# Patient Record
Sex: Female | Born: 1960 | Race: Black or African American | Hispanic: No | Marital: Single | Smoking: Current every day smoker
Health system: Southern US, Community
[De-identification: ages and names within clinical notes are randomized; demographics above are authoritative.]

## PROBLEM LIST (undated history)

## (undated) DIAGNOSIS — N200 Calculus of kidney: Secondary | ICD-10-CM

## (undated) DIAGNOSIS — I10 Essential (primary) hypertension: Secondary | ICD-10-CM

## (undated) DIAGNOSIS — R569 Unspecified convulsions: Secondary | ICD-10-CM

## (undated) HISTORY — PX: KIDNEY STONE SURGERY: SHX686

## (undated) HISTORY — PX: LITHOTRIPSY: SUR834

---

## 1999-12-09 ENCOUNTER — Other Ambulatory Visit: Admission: RE | Admit: 1999-12-09 | Discharge: 1999-12-09 | Payer: Self-pay | Admitting: Obstetrics and Gynecology

## 2000-01-15 ENCOUNTER — Encounter (INDEPENDENT_AMBULATORY_CARE_PROVIDER_SITE_OTHER): Payer: Self-pay

## 2000-01-15 ENCOUNTER — Other Ambulatory Visit: Admission: RE | Admit: 2000-01-15 | Discharge: 2000-01-15 | Payer: Self-pay | Admitting: Obstetrics and Gynecology

## 2002-04-20 ENCOUNTER — Encounter: Payer: Self-pay | Admitting: *Deleted

## 2002-04-20 ENCOUNTER — Encounter: Admission: RE | Admit: 2002-04-20 | Discharge: 2002-04-20 | Payer: Self-pay | Admitting: *Deleted

## 2002-05-25 ENCOUNTER — Emergency Department (HOSPITAL_COMMUNITY): Admission: EM | Admit: 2002-05-25 | Discharge: 2002-05-25 | Payer: Self-pay | Admitting: Emergency Medicine

## 2003-06-19 ENCOUNTER — Other Ambulatory Visit: Admission: RE | Admit: 2003-06-19 | Discharge: 2003-06-19 | Payer: Self-pay | Admitting: *Deleted

## 2006-12-28 ENCOUNTER — Other Ambulatory Visit: Admission: RE | Admit: 2006-12-28 | Discharge: 2006-12-28 | Payer: Self-pay | Admitting: *Deleted

## 2009-10-27 ENCOUNTER — Encounter: Admission: RE | Admit: 2009-10-27 | Discharge: 2009-10-27 | Payer: Self-pay | Admitting: Gastroenterology

## 2011-03-06 ENCOUNTER — Emergency Department (HOSPITAL_COMMUNITY): Payer: 59

## 2011-03-06 ENCOUNTER — Emergency Department (HOSPITAL_COMMUNITY)
Admission: EM | Admit: 2011-03-06 | Discharge: 2011-03-06 | Disposition: A | Discharge status: Home / Self Care | Payer: 59 | Attending: Emergency Medicine | Admitting: Emergency Medicine

## 2011-03-06 DIAGNOSIS — N200 Calculus of kidney: Secondary | ICD-10-CM | POA: Insufficient documentation

## 2011-03-06 DIAGNOSIS — R112 Nausea with vomiting, unspecified: Secondary | ICD-10-CM | POA: Insufficient documentation

## 2011-03-06 LAB — URINALYSIS, ROUTINE W REFLEX MICROSCOPIC
Bilirubin Urine: NEGATIVE
Ketones, ur: NEGATIVE mg/dL
Nitrite: NEGATIVE
pH: 5.5 (ref 5.0–8.0)

## 2011-03-06 LAB — POCT I-STAT, CHEM 8
BUN: 10 mg/dL (ref 6–23)
Calcium, Ion: 1.16 mmol/L (ref 1.12–1.32)
Chloride: 108 mEq/L (ref 96–112)
Creatinine, Ser: 0.7 mg/dL (ref 0.4–1.2)
Glucose, Bld: 134 mg/dL — ABNORMAL HIGH (ref 70–99)
HCT: 35 % — ABNORMAL LOW (ref 36.0–46.0)
Hemoglobin: 11.9 g/dL — ABNORMAL LOW (ref 12.0–15.0)
Potassium: 3.8 mEq/L (ref 3.5–5.1)
TCO2: 21 mmol/L (ref 0–100)

## 2011-03-06 LAB — URINE MICROSCOPIC-ADD ON

## 2011-03-06 LAB — PREGNANCY, URINE: Preg Test, Ur: NEGATIVE

## 2011-03-09 ENCOUNTER — Ambulatory Visit (HOSPITAL_BASED_OUTPATIENT_CLINIC_OR_DEPARTMENT_OTHER)
Admission: RE | Admit: 2011-03-09 | Discharge: 2011-03-09 | Disposition: A | Discharge status: Home / Self Care | Payer: 59 | Source: Ambulatory Visit | Attending: Urology | Admitting: Urology

## 2011-03-09 DIAGNOSIS — N201 Calculus of ureter: Secondary | ICD-10-CM | POA: Insufficient documentation

## 2011-03-09 DIAGNOSIS — F17200 Nicotine dependence, unspecified, uncomplicated: Secondary | ICD-10-CM | POA: Insufficient documentation

## 2011-03-09 LAB — POCT PREGNANCY, URINE: Preg Test, Ur: NEGATIVE

## 2011-03-09 LAB — POCT HEMOGLOBIN-HEMACUE: Hemoglobin: 10 g/dL — ABNORMAL LOW (ref 12.0–15.0)

## 2011-03-16 NOTE — Op Note (Addendum)
  Deanna Goodwin, Deanna Goodwin              ACCOUNT NO.:  [id number]  MEDICAL RECORD NO.:  [id number]  LOCATION:  [facility]                         FACILITY:  [facility]  PHYSICIAN:  Martina Sinner, MD DATE OF BIRTH:  1961/08/18  DATE OF PROCEDURE: DATE OF DISCHARGE:  03/06/2011                              OPERATIVE REPORT   PREOPERATIVE DIAGNOSIS:  Left ureteral stone.  POSTOPERATIVE DIAGNOSIS:  Left ureteral stone.  SURGERY:  Cystoscopy, left retrograde ureterogram, insertion of left ureteral stent.  INDICATIONS FOR PROCEDURE:  Ms. [name] has bilateral renal calculi.  She has a large stone on the right and poorly calcified 11 x 6 mm stone at the left ureteropelvic junction.  She is requiring Percocet. She consented to cystoscopy.  She consented to the above procedure.  She may need either lithotripsy but more likely ureteroscopy on the left eventually.  She probably will need a percutaneous procedure on the right based on stone burden and size.  DESCRIPTION OF PROCEDURE:  Preoperative antibiotics were given.  A 21- [place] scope was utilized.  Bladder mucosa and trigone were normal. There was no stitch, foreign body or carcinoma.  Left ureteral orifice was easy to identify.  Fluoroscopically and cystoscopically, I passed a flexible wire to the mid ureter.  Over this, I passed a 6-French open-ended ureteral catheter.  I did a gentle retrograde ureterogram but the dye would not enter the pelvis other than a few wisps.  I therefore advanced the wire up to what I thought was the renal collecting system.  I then reinserted the open-end catheter to that level and removed the wire and injected gently 2 mL of contrast and I could partially dilated calices as a target to place my stent.  She had a very impressive volcano affect with hydronephrotic drip with dark colored urine after I passed the open-ended catheter, passed the ureteropelvic junction stone.  With the wire replaced  currently nicely in the upper and mid calices, I passed a 24 cm x 6 French double-J stent with no strain under fluoroscopic guidance and cystoscopic guidance into the upper pole calyx and currently nicely in the bladder.  She is only 5 feet 3.  Bladder was emptied and the patient was taken to the recovery  room.  Retrograde Ureterogram:  I did a retrograde ureterogram using the above technique.  I did 2 injections as noted above.  A total of approximately 12 mL of contrast was instilled but the first 10 mL demonstrated a fairly normal ureter with a possible filling defect at the ureteropelvic junction and the rest of the dye exited the ureteral orifice.  Fluoroscopic films were taken but I did not put her copies on her chart.          ______________________________ Martina Sinner, MD     SAM/MEDQ  D:  03/09/2011  T:  03/09/2011  Job:  [id number]  Electronically Signed by Alfredo Martinez MD on 03/30/2011 12:28:56 PM

## 2011-03-19 ENCOUNTER — Ambulatory Visit (HOSPITAL_COMMUNITY): Payer: 59

## 2011-03-19 ENCOUNTER — Ambulatory Visit (HOSPITAL_BASED_OUTPATIENT_CLINIC_OR_DEPARTMENT_OTHER)
Admission: RE | Admit: 2011-03-19 | Discharge: 2011-03-19 | Disposition: A | Discharge status: Home / Self Care | Payer: 59 | Source: Ambulatory Visit | Attending: Urology | Admitting: Urology

## 2011-03-19 DIAGNOSIS — F17200 Nicotine dependence, unspecified, uncomplicated: Secondary | ICD-10-CM | POA: Insufficient documentation

## 2011-03-19 DIAGNOSIS — N200 Calculus of kidney: Secondary | ICD-10-CM | POA: Insufficient documentation

## 2011-03-19 DIAGNOSIS — Z01818 Encounter for other preprocedural examination: Secondary | ICD-10-CM | POA: Insufficient documentation

## 2011-03-19 DIAGNOSIS — Z01812 Encounter for preprocedural laboratory examination: Secondary | ICD-10-CM | POA: Insufficient documentation

## 2011-03-19 LAB — POCT HEMOGLOBIN-HEMACUE: Hemoglobin: 10.3 g/dL — ABNORMAL LOW (ref 12.0–15.0)

## 2011-03-19 LAB — POCT PREGNANCY, URINE: Preg Test, Ur: NEGATIVE

## 2012-10-04 ENCOUNTER — Other Ambulatory Visit: Payer: Self-pay | Admitting: Gynecology

## 2012-10-04 DIAGNOSIS — R928 Other abnormal and inconclusive findings on diagnostic imaging of breast: Secondary | ICD-10-CM

## 2012-10-17 ENCOUNTER — Ambulatory Visit
Admission: RE | Admit: 2012-10-17 | Discharge: 2012-10-17 | Disposition: A | Discharge status: Home / Self Care | Payer: 59 | Source: Ambulatory Visit | Attending: Gynecology | Admitting: Gynecology

## 2012-10-17 DIAGNOSIS — R928 Other abnormal and inconclusive findings on diagnostic imaging of breast: Secondary | ICD-10-CM

## 2014-06-26 ENCOUNTER — Encounter (HOSPITAL_COMMUNITY): Payer: PRIVATE HEALTH INSURANCE | Admitting: Anesthesiology

## 2014-06-26 ENCOUNTER — Encounter (HOSPITAL_COMMUNITY)
Admission: EM | Disposition: A | Discharge status: Home / Self Care | Payer: Self-pay | Source: Home / Self Care | Attending: Urology

## 2014-06-26 ENCOUNTER — Emergency Department (HOSPITAL_COMMUNITY): Payer: PRIVATE HEALTH INSURANCE

## 2014-06-26 ENCOUNTER — Inpatient Hospital Stay (HOSPITAL_COMMUNITY): Payer: PRIVATE HEALTH INSURANCE

## 2014-06-26 ENCOUNTER — Inpatient Hospital Stay (HOSPITAL_COMMUNITY)
Admission: EM | Admit: 2014-06-26 | Discharge: 2014-06-29 | DRG: 871 | Disposition: A | Discharge status: Home / Self Care | Payer: PRIVATE HEALTH INSURANCE | Attending: Urology | Admitting: Urology

## 2014-06-26 ENCOUNTER — Encounter (HOSPITAL_COMMUNITY): Payer: Self-pay | Admitting: Emergency Medicine

## 2014-06-26 ENCOUNTER — Emergency Department (HOSPITAL_COMMUNITY): Payer: PRIVATE HEALTH INSURANCE | Admitting: Anesthesiology

## 2014-06-26 DIAGNOSIS — D649 Anemia, unspecified: Secondary | ICD-10-CM

## 2014-06-26 DIAGNOSIS — N12 Tubulo-interstitial nephritis, not specified as acute or chronic: Secondary | ICD-10-CM | POA: Diagnosis present

## 2014-06-26 DIAGNOSIS — N132 Hydronephrosis with renal and ureteral calculous obstruction: Secondary | ICD-10-CM | POA: Diagnosis present

## 2014-06-26 DIAGNOSIS — I10 Essential (primary) hypertension: Secondary | ICD-10-CM | POA: Diagnosis present

## 2014-06-26 DIAGNOSIS — N202 Calculus of kidney with calculus of ureter: Secondary | ICD-10-CM | POA: Diagnosis present

## 2014-06-26 DIAGNOSIS — N179 Acute kidney failure, unspecified: Secondary | ICD-10-CM | POA: Diagnosis present

## 2014-06-26 DIAGNOSIS — D696 Thrombocytopenia, unspecified: Secondary | ICD-10-CM | POA: Diagnosis present

## 2014-06-26 DIAGNOSIS — N289 Disorder of kidney and ureter, unspecified: Secondary | ICD-10-CM

## 2014-06-26 DIAGNOSIS — N390 Urinary tract infection, site not specified: Secondary | ICD-10-CM

## 2014-06-26 DIAGNOSIS — A419 Sepsis, unspecified organism: Secondary | ICD-10-CM | POA: Diagnosis present

## 2014-06-26 DIAGNOSIS — J9811 Atelectasis: Secondary | ICD-10-CM | POA: Diagnosis present

## 2014-06-26 DIAGNOSIS — Z720 Tobacco use: Secondary | ICD-10-CM

## 2014-06-26 DIAGNOSIS — Z87442 Personal history of urinary calculi: Secondary | ICD-10-CM | POA: Diagnosis not present

## 2014-06-26 DIAGNOSIS — N138 Other obstructive and reflux uropathy: Secondary | ICD-10-CM | POA: Diagnosis present

## 2014-06-26 DIAGNOSIS — Z23 Encounter for immunization: Secondary | ICD-10-CM | POA: Diagnosis not present

## 2014-06-26 DIAGNOSIS — R6521 Severe sepsis with septic shock: Secondary | ICD-10-CM | POA: Diagnosis present

## 2014-06-26 DIAGNOSIS — G9341 Metabolic encephalopathy: Secondary | ICD-10-CM | POA: Diagnosis not present

## 2014-06-26 DIAGNOSIS — K30 Functional dyspepsia: Secondary | ICD-10-CM | POA: Diagnosis present

## 2014-06-26 DIAGNOSIS — F17210 Nicotine dependence, cigarettes, uncomplicated: Secondary | ICD-10-CM | POA: Diagnosis present

## 2014-06-26 DIAGNOSIS — N2889 Other specified disorders of kidney and ureter: Secondary | ICD-10-CM

## 2014-06-26 DIAGNOSIS — R109 Unspecified abdominal pain: Secondary | ICD-10-CM | POA: Diagnosis present

## 2014-06-26 HISTORY — PX: CYSTOSCOPY WITH RETROGRADE PYELOGRAM, URETEROSCOPY AND STENT PLACEMENT: SHX5789

## 2014-06-26 HISTORY — DX: Calculus of kidney: N20.0

## 2014-06-26 LAB — COMPREHENSIVE METABOLIC PANEL
ALK PHOS: 139 U/L — AB (ref 39–117)
ALT: 21 U/L (ref 0–35)
ALT: 18 U/L (ref 0–35)
AST: 22 U/L (ref 0–37)
AST: 27 U/L (ref 0–37)
Albumin: 2.8 g/dL — ABNORMAL LOW (ref 3.5–5.2)
Albumin: 1.9 g/dL — ABNORMAL LOW (ref 3.5–5.2)
Alkaline Phosphatase: 200 U/L — ABNORMAL HIGH (ref 39–117)
Anion gap: 28 — ABNORMAL HIGH (ref 5–15)
Anion gap: 17 — ABNORMAL HIGH (ref 5–15)
BUN: 19 mg/dL (ref 6–23)
BUN: 19 mg/dL (ref 6–23)
CALCIUM: 9.6 mg/dL (ref 8.4–10.5)
CALCIUM: 7.2 mg/dL — AB (ref 8.4–10.5)
CHLORIDE: 103 meq/L (ref 96–112)
CO2: 16 mEq/L — ABNORMAL LOW (ref 19–32)
CO2: 16 mEq/L — ABNORMAL LOW (ref 19–32)
CREATININE: 2.11 mg/dL — AB (ref 0.50–1.10)
Chloride: 94 mEq/L — ABNORMAL LOW (ref 96–112)
Creatinine, Ser: 2.06 mg/dL — ABNORMAL HIGH (ref 0.50–1.10)
GFR calc Af Amer: 31 mL/min — ABNORMAL LOW (ref >90)
GFR calc non Af Amer: 26 mL/min — ABNORMAL LOW (ref >90)
GFR, EST AFRICAN AMERICAN: 30 mL/min — AB (ref >90)
GFR, EST NON AFRICAN AMERICAN: 27 mL/min — AB (ref >90)
GLUCOSE: 119 mg/dL — AB (ref 70–99)
Glucose, Bld: 114 mg/dL — ABNORMAL HIGH (ref 70–99)
POTASSIUM: 3.2 meq/L — AB (ref 3.7–5.3)
Potassium: 4.4 mEq/L (ref 3.7–5.3)
SODIUM: 138 meq/L (ref 137–147)
Sodium: 136 mEq/L — ABNORMAL LOW (ref 137–147)
TOTAL PROTEIN: 7.8 g/dL (ref 6.0–8.3)
Total Bilirubin: 0.9 mg/dL (ref 0.3–1.2)
Total Bilirubin: 1.1 mg/dL (ref 0.3–1.2)
Total Protein: 5.5 g/dL — ABNORMAL LOW (ref 6.0–8.3)

## 2014-06-26 LAB — URINALYSIS, ROUTINE W REFLEX MICROSCOPIC
GLUCOSE, UA: NEGATIVE mg/dL
Ketones, ur: NEGATIVE mg/dL
Nitrite: NEGATIVE
PH: 5 (ref 5.0–8.0)
PROTEIN: 100 mg/dL — AB
SPECIFIC GRAVITY, URINE: 1.02 (ref 1.005–1.030)
UROBILINOGEN UA: 1 mg/dL (ref 0.0–1.0)

## 2014-06-26 LAB — CBC WITH DIFFERENTIAL/PLATELET
BASOS ABS: 0 10*3/uL (ref 0.0–0.1)
Basophils Relative: 0 % (ref 0–1)
EOS ABS: 0.1 10*3/uL (ref 0.0–0.7)
EOS PCT: 3 % (ref 0–5)
HCT: 31.3 % — ABNORMAL LOW (ref 36.0–46.0)
Hemoglobin: 9.8 g/dL — ABNORMAL LOW (ref 12.0–15.0)
Lymphocytes Relative: 37 % (ref 12–46)
Lymphs Abs: 1 10*3/uL (ref 0.7–4.0)
MCH: 24.7 pg — AB (ref 26.0–34.0)
MCHC: 31.3 g/dL (ref 30.0–36.0)
MCV: 78.8 fL (ref 78.0–100.0)
MONO ABS: 0 10*3/uL — AB (ref 0.1–1.0)
Monocytes Relative: 2 % — ABNORMAL LOW (ref 3–12)
Neutro Abs: 1.5 10*3/uL — ABNORMAL LOW (ref 1.7–7.7)
Neutrophils Relative %: 58 % (ref 43–77)
PLATELETS: 199 10*3/uL (ref 150–400)
RBC: 3.97 MIL/uL (ref 3.87–5.11)
RDW: 18.9 % — AB (ref 11.5–15.5)
WBC: 2.6 10*3/uL — ABNORMAL LOW (ref 4.0–10.5)

## 2014-06-26 LAB — LIPASE, BLOOD: LIPASE: 7 U/L — AB (ref 11–59)

## 2014-06-26 LAB — I-STAT CG4 LACTIC ACID, ED: Lactic Acid, Venous: 4.24 mmol/L — ABNORMAL HIGH (ref 0.5–2.2)

## 2014-06-26 LAB — TROPONIN I: Troponin I: <0.3 ng/mL (ref <0.30)

## 2014-06-26 LAB — URINE MICROSCOPIC-ADD ON

## 2014-06-26 LAB — CBC
HCT: 24.6 % — ABNORMAL LOW (ref 36.0–46.0)
Hemoglobin: 7.9 g/dL — ABNORMAL LOW (ref 12.0–15.0)
MCH: 24.5 pg — AB (ref 26.0–34.0)
MCHC: 32.1 g/dL (ref 30.0–36.0)
MCV: 76.2 fL — ABNORMAL LOW (ref 78.0–100.0)
Platelets: 98 10*3/uL — ABNORMAL LOW (ref 150–400)
RBC: 3.23 MIL/uL — ABNORMAL LOW (ref 3.87–5.11)
RDW: 19 % — ABNORMAL HIGH (ref 11.5–15.5)
WBC: 9.7 10*3/uL (ref 4.0–10.5)

## 2014-06-26 LAB — MRSA PCR SCREENING: MRSA BY PCR: NEGATIVE

## 2014-06-26 LAB — LACTIC ACID, PLASMA: Lactic Acid, Venous: 2.7 mmol/L — ABNORMAL HIGH (ref 0.5–2.2)

## 2014-06-26 SURGERY — CYSTOURETEROSCOPY, WITH RETROGRADE PYELOGRAM AND STENT INSERTION
Anesthesia: General | Site: Urethra | Laterality: Right

## 2014-06-26 MED ORDER — SODIUM CHLORIDE 0.9 % IV SOLN
INTRAVENOUS | Status: DC
  Administered 2014-06-26: 1000 mL via INTRAVENOUS
  Administered 2014-06-27 – 2014-06-28 (×2): via INTRAVENOUS

## 2014-06-26 MED ORDER — ENOXAPARIN SODIUM 40 MG/0.4ML ~~LOC~~ SOLN
40.0000 mg | SUBCUTANEOUS | Status: DC
  Administered 2014-06-27 – 2014-06-29 (×3): 40 mg via SUBCUTANEOUS
  Filled 2014-06-26 (×3): qty 0.4

## 2014-06-26 MED ORDER — LIDOCAINE HCL (CARDIAC) 20 MG/ML IV SOLN
INTRAVENOUS | Status: DC | PRN
  Administered 2014-06-26: 50 mg via INTRAVENOUS

## 2014-06-26 MED ORDER — SODIUM CHLORIDE 0.9 % IV BOLUS (SEPSIS)
1000.0000 mL | Freq: Once | INTRAVENOUS | Status: AC
  Administered 2014-06-26: 1000 mL via INTRAVENOUS

## 2014-06-26 MED ORDER — POTASSIUM CHLORIDE 10 MEQ/50ML IV SOLN: 10.0000 meq | INTRAVENOUS | Status: DC

## 2014-06-26 MED ORDER — HYDROMORPHONE HCL 1 MG/ML IJ SOLN
0.5000 mg | INTRAMUSCULAR | Status: DC | PRN
  Administered 2014-06-26: 1 mg via INTRAVENOUS
  Filled 2014-06-26: qty 1

## 2014-06-26 MED ORDER — BELLADONNA ALKALOIDS-OPIUM 16.2-60 MG RE SUPP
RECTAL | Status: AC
  Filled 2014-06-26: qty 1

## 2014-06-26 MED ORDER — LACTATED RINGERS IV SOLN
INTRAVENOUS | Status: DC | PRN
  Administered 2014-06-26: 16:00:00 via INTRAVENOUS

## 2014-06-26 MED ORDER — PROPOFOL 10 MG/ML IV BOLUS
INTRAVENOUS | Status: AC
  Filled 2014-06-26: qty 20

## 2014-06-26 MED ORDER — ONDANSETRON HCL 4 MG/2ML IJ SOLN
4.0000 mg | Freq: Once | INTRAMUSCULAR | Status: AC
  Administered 2014-06-26: 4 mg via INTRAVENOUS
  Filled 2014-06-26: qty 2

## 2014-06-26 MED ORDER — DEXTROSE 5 % IV SOLN
1.0000 g | INTRAVENOUS | Status: DC
  Administered 2014-06-27: 1 g via INTRAVENOUS
  Filled 2014-06-26: qty 10

## 2014-06-26 MED ORDER — SODIUM CHLORIDE 0.9 % IR SOLN
Status: DC | PRN
  Administered 2014-06-26: 3000 mL

## 2014-06-26 MED ORDER — INFLUENZA VAC SPLIT QUAD 0.5 ML IM SUSY
0.5000 mL | PREFILLED_SYRINGE | INTRAMUSCULAR | Status: AC
  Administered 2014-06-28: 0.5 mL via INTRAMUSCULAR
  Filled 2014-06-26 (×3): qty 0.5

## 2014-06-26 MED ORDER — FENTANYL CITRATE 0.05 MG/ML IJ SOLN: 25.0000 ug | INTRAMUSCULAR | Status: DC | PRN

## 2014-06-26 MED ORDER — PHENYLEPHRINE 40 MCG/ML (10ML) SYRINGE FOR IV PUSH (FOR BLOOD PRESSURE SUPPORT)
PREFILLED_SYRINGE | INTRAVENOUS | Status: AC
  Filled 2014-06-26: qty 10

## 2014-06-26 MED ORDER — FENTANYL CITRATE 0.05 MG/ML IJ SOLN
50.0000 ug | Freq: Once | INTRAMUSCULAR | Status: DC
  Filled 2014-06-26: qty 2

## 2014-06-26 MED ORDER — FENTANYL CITRATE 0.05 MG/ML IJ SOLN
INTRAMUSCULAR | Status: DC | PRN
  Administered 2014-06-26 (×2): 50 ug via INTRAVENOUS

## 2014-06-26 MED ORDER — LIDOCAINE HCL 2 % EX GEL
CUTANEOUS | Status: AC
  Filled 2014-06-26: qty 10

## 2014-06-26 MED ORDER — DEXTROSE 5 % IV SOLN
1.0000 g | Freq: Once | INTRAVENOUS | Status: AC
  Administered 2014-06-26: 1 g via INTRAVENOUS
  Filled 2014-06-26: qty 10

## 2014-06-26 MED ORDER — MIDAZOLAM HCL 5 MG/5ML IJ SOLN
INTRAMUSCULAR | Status: DC | PRN
  Administered 2014-06-26: 2 mg via INTRAVENOUS

## 2014-06-26 MED ORDER — ACETAMINOPHEN 325 MG PO TABS
650.0000 mg | ORAL_TABLET | Freq: Once | ORAL | Status: AC
  Administered 2014-06-26: 650 mg via ORAL
  Filled 2014-06-26: qty 2

## 2014-06-26 MED ORDER — LACTATED RINGERS IV SOLN: INTRAVENOUS | Status: DC

## 2014-06-26 MED ORDER — ONDANSETRON HCL 4 MG/2ML IJ SOLN: 4.0000 mg | INTRAMUSCULAR | Status: DC | PRN

## 2014-06-26 MED ORDER — DOCUSATE SODIUM 100 MG PO CAPS
100.0000 mg | ORAL_CAPSULE | Freq: Two times a day (BID) | ORAL | Status: DC
  Administered 2014-06-26 – 2014-06-29 (×6): 100 mg via ORAL
  Filled 2014-06-26 (×6): qty 1

## 2014-06-26 MED ORDER — PHENYLEPHRINE HCL 10 MG/ML IJ SOLN
INTRAMUSCULAR | Status: DC | PRN
  Administered 2014-06-26 (×3): 40 ug via INTRAVENOUS
  Administered 2014-06-26: 80 ug via INTRAVENOUS

## 2014-06-26 MED ORDER — SODIUM CHLORIDE 0.9 % IV BOLUS (SEPSIS): 500.0000 mL | Freq: Once | INTRAVENOUS | Status: DC

## 2014-06-26 MED ORDER — SODIUM CHLORIDE 0.9 % IV BOLUS (SEPSIS): 1000.0000 mL | Freq: Once | INTRAVENOUS | Status: DC

## 2014-06-26 MED ORDER — ONDANSETRON HCL 4 MG/2ML IJ SOLN
INTRAMUSCULAR | Status: DC | PRN
  Administered 2014-06-26: 4 mg via INTRAVENOUS

## 2014-06-26 MED ORDER — ONDANSETRON HCL 4 MG/2ML IJ SOLN
INTRAMUSCULAR | Status: AC
  Filled 2014-06-26: qty 2

## 2014-06-26 MED ORDER — DEXTROSE 5 % IV SOLN
30.0000 ug/min | INTRAVENOUS | Status: DC
  Administered 2014-06-26 – 2014-06-27 (×2): 30 ug/min via INTRAVENOUS
  Filled 2014-06-26 (×2): qty 1

## 2014-06-26 MED ORDER — PNEUMOCOCCAL VAC POLYVALENT 25 MCG/0.5ML IJ INJ
0.5000 mL | INJECTION | INTRAMUSCULAR | Status: AC
  Administered 2014-06-28: 0.5 mL via INTRAMUSCULAR
  Filled 2014-06-26 (×3): qty 0.5

## 2014-06-26 MED ORDER — POTASSIUM CHLORIDE 10 MEQ/50ML IV SOLN
10.0000 meq | INTRAVENOUS | Status: AC
  Administered 2014-06-26 (×2): 10 meq via INTRAVENOUS
  Filled 2014-06-26 (×2): qty 50

## 2014-06-26 MED ORDER — HYDROCODONE-ACETAMINOPHEN 5-325 MG PO TABS
1.0000 | ORAL_TABLET | ORAL | Status: DC | PRN
  Administered 2014-06-27 – 2014-06-28 (×3): 2 via ORAL
  Administered 2014-06-28 (×2): 1 via ORAL
  Filled 2014-06-26: qty 2
  Filled 2014-06-26: qty 1
  Filled 2014-06-26 (×3): qty 2

## 2014-06-26 MED ORDER — MIDAZOLAM HCL 2 MG/2ML IJ SOLN
INTRAMUSCULAR | Status: AC
  Filled 2014-06-26: qty 2

## 2014-06-26 MED ORDER — FENTANYL CITRATE 0.05 MG/ML IJ SOLN
INTRAMUSCULAR | Status: AC
  Filled 2014-06-26: qty 2

## 2014-06-26 MED ORDER — PROPOFOL 10 MG/ML IV BOLUS
INTRAVENOUS | Status: DC | PRN
  Administered 2014-06-26: 160 mg via INTRAVENOUS

## 2014-06-26 MED ORDER — SODIUM CHLORIDE 0.9 % IV BOLUS (SEPSIS)
1000.0000 mL | Freq: Once | INTRAVENOUS | Status: AC
  Administered 2014-06-26 (×2): 1000 mL via INTRAVENOUS

## 2014-06-26 SURGICAL SUPPLY — 14 items
BAG URO CATCHER STRL LF (DRAPE) ×3 IMPLANT
BASKET ZERO TIP NITINOL 2.4FR (BASKET) IMPLANT
CATH INTERMIT  6FR 70CM (CATHETERS) ×3 IMPLANT
CLOTH BEACON ORANGE TIMEOUT ST (SAFETY) ×3 IMPLANT
DRAPE CAMERA CLOSED 9X96 (DRAPES) ×3 IMPLANT
GLOVE BIOGEL M STRL SZ7.5 (GLOVE) ×3 IMPLANT
GOWN STRL REUS W/TWL LRG LVL3 (GOWN DISPOSABLE) ×6 IMPLANT
GUIDEWIRE ANG ZIPWIRE 038X150 (WIRE) ×3 IMPLANT
GUIDEWIRE STR DUAL SENSOR (WIRE) ×3 IMPLANT
MANIFOLD NEPTUNE II (INSTRUMENTS) ×3 IMPLANT
PACK CYSTO (CUSTOM PROCEDURE TRAY) ×6 IMPLANT
STENT CONTOUR 6FRX24X.038 (STENTS) ×3 IMPLANT
TUBING CONNECTING 10 (TUBING) ×2 IMPLANT
TUBING CONNECTING 10' (TUBING) ×1

## 2014-06-26 NOTE — Anesthesia Preprocedure Evaluation (Addendum)
Anesthesia Evaluation  Patient identified by MRN, date of birth, ID band Patient awake    Reviewed: Allergy & Precautions, H&P , NPO status , Patient's Chart, lab work & pertinent test results  Airway Mallampati: II TM Distance: >3 FB Neck ROM: full    Dental no notable dental hx.    Pulmonary neg pulmonary ROS, Current Smoker,  breath sounds clear to auscultation  Pulmonary exam normal       Cardiovascular Exercise Tolerance: Good negative cardio ROS  Rhythm:regular Rate:Normal  Sepsis. shock   Neuro/Psych negative neurological ROS  negative psych ROS   GI/Hepatic negative GI ROS, Neg liver ROS,   Endo/Other  negative endocrine ROS  Renal/GU negative Renal ROSCRT 2.11  negative genitourinary   Musculoskeletal   Abdominal   Peds  Hematology negative hematology ROS (+) anemia , hgb 9.8   Anesthesia Other Findings   Reproductive/Obstetrics negative OB ROS                          Anesthesia Physical Anesthesia Plan  ASA: III and emergent  Anesthesia Plan: General   Post-op Pain Management:    Induction:   Airway Management Planned: LMA  Additional Equipment:   Intra-op Plan:   Post-operative Plan:   Informed Consent: I have reviewed the patients History and Physical, chart, labs and discussed the procedure including the risks, benefits and alternatives for the proposed anesthesia with the patient or authorized representative who has indicated his/her understanding and acceptance.   Dental Advisory Given  Plan Discussed with: CRNA and Surgeon  Anesthesia Plan Comments:        Anesthesia Quick Evaluation

## 2014-06-26 NOTE — Anesthesia Postprocedure Evaluation (Signed)
  Anesthesia Post-op Note  Patient: Deanna Goodwin  Procedure(s) Performed: Procedure(s) (LRB): CYSTOSCOPY WITH STENT PLACEMENT (Right)  Patient Location: PACU  Anesthesia Type: General  Level of Consciousness: awake and alert   Airway and Oxygen Therapy: Patient Spontanous Breathing  Post-op Pain: mild  Post-op Assessment: Post-op Vital signs reviewed, Patient's Cardiovascular Status Stable, Respiratory Function Stable, Patent Airway and No signs of Nausea or vomiting  Last Vitals:  Filed Vitals:   06/26/14 1656  BP:   Pulse: 124  Temp: 37.1 C  Resp: 30    Post-op Vital Signs: stable   Complications: No apparent anesthesia complications

## 2014-06-26 NOTE — ED Provider Notes (Signed)
CSN: [id number]     Arrival date & time 06/26/14  [id number] History   First MD Initiated Contact with Patient 06/26/14 1100     Chief Complaint  Patient presents with  . Abdominal Pain  . Dysuria     (Consider location/radiation/quality/duration/timing/severity/associated sxs/prior Treatment) Patient is a 53 y.o. female presenting with abdominal pain and dysuria. The history is provided by the patient.  Abdominal Pain Associated symptoms: chills, diarrhea, dysuria, nausea and vomiting   Associated symptoms: no chest pain and no shortness of breath   Dysuria Associated symptoms: abdominal pain, flank pain, nausea and vomiting    patient states that she has some right flank pain. She's had nausea vomiting some diarrhea for the last few days. States she had had decreased oral intake. The symptoms began around a week ago and the nausea vomiting diarrhea has improved somewhat, but the pain is better. She states she's had decreased urine output. Denies fevers. She states she had some chills initially but that is resolved. The pain is dull on right side. She states she thinks she had some urine with blood in it.  Past Medical History  Diagnosis Date  . Kidney stones    Past Surgical History  Procedure Laterality Date  . Lithotripsy     History reviewed. No pertinent family history. History  Substance Use Topics  . Smoking status: Current Every Day Smoker  . Smokeless tobacco: Not on file  . Alcohol Use: Yes   OB History   Grav Para Term Preterm Abortions TAB SAB Ect Mult Living                 Review of Systems  Constitutional: Positive for chills and appetite change. Negative for activity change.  Eyes: Negative for pain.  Respiratory: Negative for chest tightness and shortness of breath.   Cardiovascular: Negative for chest pain and leg swelling.  Gastrointestinal: Positive for nausea, vomiting, abdominal pain and diarrhea.  Genitourinary: Positive for dysuria and flank pain.  Negative for frequency.  Musculoskeletal: Negative for back pain and neck stiffness.  Skin: Negative for rash.  Neurological: Negative for weakness, numbness and headaches.  Psychiatric/Behavioral: Negative for behavioral problems.      Allergies  Review of patient's allergies indicates no known allergies.  Home Medications   Prior to Admission medications   Not on File   BP 106/48  Pulse 124  Temp(Src) 98.4 F (36.9 C) (Oral)  Resp 30  SpO2 100%  LMP 05/30/2014 Physical Exam  Constitutional: She appears well-developed and well-nourished.  HENT:  Head: Normocephalic.  Eyes: EOM are normal. Pupils are equal, round, and reactive to light.  Neck: Neck supple.  Cardiovascular: Normal rate and regular rhythm.   Pulmonary/Chest: Effort normal.  Abdominal: Soft. Bowel sounds are normal.  Musculoskeletal: Normal range of motion.  Neurological: She is alert.  Skin: Skin is warm.    ED Course  Procedures (including critical care time) Labs Review Labs Reviewed  URINALYSIS, ROUTINE W REFLEX MICROSCOPIC - Abnormal; Notable for the following:    Color, Urine ORANGE (*)    APPearance CLOUDY (*)    Hgb urine dipstick SMALL (*)    Bilirubin Urine SMALL (*)    Protein, ur 100 (*)    Leukocytes, UA SMALL (*)    All other components within normal limits  CBC WITH DIFFERENTIAL - Abnormal; Notable for the following:    WBC 2.6 (*)    Hemoglobin 9.8 (*)    HCT 31.3 (*)  MCH 24.7 (*)    RDW 18.9 (*)    Neutro Abs 1.5 (*)    Monocytes Relative 2 (*)    Monocytes Absolute 0.0 (*)    All other components within normal limits  COMPREHENSIVE METABOLIC PANEL - Abnormal; Notable for the following:    Chloride 94 (*)    CO2 16 (*)    Glucose, Bld 114 (*)    Creatinine, Ser 2.11 (*)    Albumin 2.8 (*)    Alkaline Phosphatase 200 (*)    GFR calc non Af Amer 26 (*)    GFR calc Af Amer 30 (*)    Anion gap 28 (*)    All other components within normal limits  LIPASE, BLOOD -  Abnormal; Notable for the following:    Lipase 7 (*)    All other components within normal limits  URINE MICROSCOPIC-ADD ON - Abnormal; Notable for the following:    Bacteria, UA MANY (*)    Casts GRANULAR CAST (*)    All other components within normal limits  I-STAT CG4 LACTIC ACID, ED - Abnormal; Notable for the following:    Lactic Acid, Venous 4.24 (*)    All other components within normal limits  CULTURE, BLOOD (ROUTINE X 2)  CULTURE, BLOOD (ROUTINE X 2)  URINE CULTURE    Imaging Review Ct Abdomen Pelvis Wo Contrast  06/26/2014   CLINICAL DATA:  Right-sided flank pain  EXAM: CT ABDOMEN AND PELVIS WITHOUT CONTRAST  TECHNIQUE: Multidetector CT imaging of the abdomen and pelvis was performed following the standard protocol without IV contrast.  COMPARISON:  03/19/2011  FINDINGS: The lung bases demonstrate some right basilar atelectasis.  The liver, gallbladder, spleen, adrenal glands and pancreas are within normal limits. The left kidney is well visualized and shows to a tiny nonobstructing stone in the lower pole. The right kidney demonstrates moderate hydronephrosis and hydroureter secondary to a 6 mm stone just above the ureteropelvic junction. Additionally multiple nonobstructing renal calculi are noted in the right kidney particular in the upper and mid polar region suggesting early staghorn calculus. The appendix is within normal limits.  The bladder is partially distended. Uterine calcifications consistent with fibroid disease are noted. Minimal free fluid is noted likely physiologic in nature.  IMPRESSION: Early right staghorn calculus.  Right hydronephrosis and hydroureter secondary to a 6 mm distal ureteral stone.  Tiny nonobstructing left renal stone in the lower pole.   Electronically Signed   By: Alcide Clever M.D.   On: 06/26/2014 13:45   Dg Chest Port 1 View  06/26/2014   CLINICAL DATA:  Shortness of breath, cough, chest pain and fever for 6 days, history smoking  EXAM: PORTABLE  CHEST - 1 VIEW  COMPARISON:  Portable exam 1327 hr without priors for comparison  FINDINGS: Enlargement of cardiac silhouette with pulmonary vascular congestion.  Mediastinal contours and pulmonary vascularity normal.  Subsegmental atelectasis at RIGHT base.  Remaining lungs clear.  No pleural effusion or pneumothorax.  Bones unremarkable.  IMPRESSION: Enlargement of cardiac silhouette with pulmonary vascular congestion.  Subsegmental atelectasis at RIGHT base.   Electronically Signed   By: Ulyses Southward M.D.   On: 06/26/2014 13:46     EKG Interpretation   Date/Time:  Wednesday June 26 2014 12:53:55 EDT Ventricular Rate:  133 PR Interval:  130 QRS Duration: 72 QT Interval:  300 QTC Calculation: 446 R Axis:   62 Text Interpretation:  Sinus tachycardia Low voltage, extremity and  precordial leads Confirmed by [facility]  MD, Harrold Donath [phone]) on 06/26/2014  4:15:26 PM      MDM   Final diagnoses:  Sepsis, due to unspecified organism  Acute upper urinary tract infection  Ureteral stone with hydronephrosis  Renal insufficiency    CRITICAL CARE Performed by: Billee Cashing Total critical care time: 30 Critical care time was exclusive of separately billable procedures and treating other patients. Critical care was necessary to treat or prevent imminent or life-threatening deterioration. Critical care was time spent personally by me on the following activities: development of treatment plan with patient and/or surrogate as well as nursing, discussions with consultants, evaluation of patient's response to treatment, examination of patient, obtaining history from patient or surrogate, ordering and performing treatments and interventions, ordering and review of laboratory studies, ordering and review of radiographic studies, pulse oximetry and re-evaluation of patient's condition.  Patient with flank pain. Has had nausea vomiting. Decreased urine output. Patient is worried about stone.  Patient later developed fever and sepsis. Lactic acid elevated hypertensive. Received repeat fluid boluses. His UTI and CT scan done due to history stones it shows infected obstructing stone. Discussed with urology, who will virtually take the patient to the operating room for stent.    Juliet Rude. Rubin Payor, MD 06/26/14 1616

## 2014-06-26 NOTE — Progress Notes (Signed)
eLink Physician-Brief Progress Note Patient Name: Deanna FetchCherina E Goodwin DOB: 15-Jul-1961 MRN: [id number]   Date of Service  06/26/2014  HPI/Events of Note  New patient arrival from OR R nephrostomy tube placed for renal stone, septic  Now in ICU with hypotension, tachycardia, tachypnea  eICU Interventions  Bolus 500cc now NP to see for possible CVL     Intervention Category Evaluation Type: New Patient Evaluation  [name] 06/26/2014, 5:29 PM

## 2014-06-26 NOTE — ED Notes (Signed)
MD at bedside. UROLOGY MD PRESENT TO EVALUATE PT

## 2014-06-26 NOTE — ED Notes (Signed)
Patient transported to CT 

## 2014-06-26 NOTE — ED Notes (Signed)
MD at bedside. EDP PICKERING AT BS 

## 2014-06-26 NOTE — ED Notes (Signed)
Charge Electa Sniffim Smith RN in to see pt. Updated on pt current status and current vital signs

## 2014-06-26 NOTE — Procedures (Signed)
Central Venous Catheter Insertion Procedure Note Deanna Goodwin [id number] 06/22/1961  Procedure: Insertion of Central Venous Catheter Indications: Assessment of intravascular volume, Drug and/or fluid administration and Frequent blood sampling  Procedure Details Consent: Risks of procedure as well as the alternatives and risks of each were explained to the (patient/caregiver).  Consent for procedure obtained. Time Out: Verified patient identification, verified procedure, site/side was marked, verified correct patient position, special equipment/implants available, medications/allergies/relevent history reviewed, required imaging and test results available.  Performed  Maximum sterile technique was used including antiseptics, cap, gloves, gown, hand hygiene, mask and sheet. Skin prep: Chlorhexidine; local anesthetic administered A antimicrobial bonded/coated triple lumen catheter was placed in the right internal jugular vein using the Seldinger technique.  Evaluation Blood flow good Complications: No apparent complications Patient did tolerate procedure well. Chest X-ray ordered to verify placement.  CXR: pending.  Procedure performed under direct ultrasound guidance for real time vessel cannulation.      Rutherford Guysahul Desai, PA - [facility] Pgr: [phone]  or [phone]  Levy Pupaobert Erven Ramson, MD, PhD 06/28/2014, 1:47 PM [facility] [phone] or if no answer [phone]

## 2014-06-26 NOTE — Progress Notes (Signed)
eLink Physician-Brief Progress Note Patient Name: Deanna Goodwin DOB: Feb 26, 1961 MRN: [id number]   Date of Service  06/26/2014  HPI/Events of Note  Hypokalemia, Cr > 2  eICU Interventions  Give low dose KCL     Intervention Category Minor Interventions: Electrolytes abnormality - evaluation and management  MCQUAID, DOUGLAS 06/26/2014, 9:30 PM

## 2014-06-26 NOTE — ED Notes (Signed)
Bed: WA01 Expected date:  Expected time:  Means of arrival:  Comments: 

## 2014-06-26 NOTE — ED Notes (Signed)
Dr Pickering aware of elevated I stat Lactic.  

## 2014-06-26 NOTE — Op Note (Signed)
Preoperative diagnosis:  1. Right ureteral stone 2. Right renal stones 3. Sepsis   Postoperative diagnosis:  1. Right ureteral stone 2. Right renal stones 3. Sepsis   Procedure:  1. Cystoscopy 2. Right ureteral stent placement (6 x 24 - no string)  Surgeon: Rolly SalterLester S. Federica Allport, Montez HagemanJr. M.D.  Anesthesia: General  Complications: None  EBL: Minimal  Specimens: None  Indication: Deanna Goodwin is a 53 y.o. patient with a distal right ureteral stone and sepsis. After reviewing the management options for treatment, he elected to proceed with the above surgical procedure(s). We have discussed the potential benefits and risks of the procedure, side effects of the proposed treatment, the likelihood of the patient achieving the goals of the procedure, and any potential problems that might occur during the procedure or recuperation. Informed consent has been obtained.  Description of procedure:  The patient was taken to the operating room and general anesthesia was induced.  The patient was placed in the dorsal lithotomy position, prepped and draped in the usual sterile fashion, and preoperative antibiotics were administered. A preoperative time-out was performed.   Cystourethroscopy was performed.  The patient's urethra was examined and was normal. The bladder was then systematically examined in its entirety. There was no evidence for any bladder tumors, stones, or other mucosal pathology.    A 0.38 sensor guidewire was then advanced up the right ureter into the renal pelvis under fluoroscopic guidance. A copious amount of purulent material was seen coming from the right ureter.  The wire was then backloaded through the cystoscope and a ureteral stent was advance over the wire using Seldinger technique.  The stent was positioned appropriately under fluoroscopic and cystoscopic guidance.  The wire was then removed with an adequate stent curl noted in the renal pelvis as well as in the  bladder.  The bladder was then emptied and the procedure ended.  The patient appeared to tolerate the procedure well and without complications.  The patient was able to be awakened and transferred to the recovery unit in satisfactory condition.    Deanna Goodwin, Jr. MD

## 2014-06-26 NOTE — Progress Notes (Signed)
eLink Physician-Brief Progress Note Patient Name: Deanna FetchCherina E Goodwin DOB: May 12, 1961 MRN: [id number]   Date of Service  06/26/2014  HPI/Events of Note  Sepsis CVP 20  eICU Interventions  KVO fluids     Intervention Category Major Interventions: Sepsis - evaluation and management  MCQUAID, DOUGLAS 06/26/2014, 10:39 PM

## 2014-06-26 NOTE — Transfer of Care (Signed)
Immediate Anesthesia Transfer of Care Note  Patient: Deanna Goodwin  Procedure(s) Performed: Procedure(s): CYSTOSCOPY WITH STENT PLACEMENT (Right)  Patient Location: PACU  Anesthesia Type:General  Level of Consciousness: awake, alert  and oriented  Airway & Oxygen Therapy: Patient Spontanous Breathing and Patient connected to face mask oxygen  Post-op Assessment: Report given to PACU RN and Post -op Vital signs reviewed and stable  Post vital signs: Reviewed and stable  Complications: No apparent anesthesia complications

## 2014-06-26 NOTE — ED Notes (Signed)
Pt c/o rt flank pain w/ dysuria since last Friday.  States that she drank a gallon of water yesterday and has not urinated.  Urinated a couple of drops last night.  Has not urinated today.  Hx of kidney stones on lt side which were surgically removed.  Denies sensation of wanting to urinate.

## 2014-06-26 NOTE — Consult Note (Signed)
PULMONARY / CRITICAL CARE MEDICINE   Name: Deanna Goodwin MRN: [id number] DOB: [birth year]    ADMISSION DATE:  06/26/2014 CONSULTATION DATE:  06/26/2014  REFERRING MD :  Dr. Laverle PatterBorden (Urology)   CHIEF COMPLAINT:  Flank pain  INITIAL PRESENTATION: 53 year old female with history of urolithiasis admitted 9/30 for R urolithiasis with sepsis. She was taken to OR and had ureteral stent placed. To ICU post-op, but shocky. PCCM asked to see.   STUDIES:  CT abd/pelv 9/30 > Right hydronephrosis and hydroureter secondary to a 6 mm distal ureteral stone. Early R staghorn calculus, Non-obstructing L stone.   SIGNIFICANT EVENTS: 9/30> admitted, to OR, ureteral stent placed. To ICU post-op  HISTORY OF PRESENT ILLNESS:  53 year old female with history of kidney stones [facility] with flank pain x 5 days, and fevers/nausea x 24 hours. In ED she was noted to be tachycardic and hypotensive. CT abdomen showed a right uretal stone with associated hydronephrosis and hydro ureter. She was admitted and taken emergently to OR and had a R ureteral stent placed. Post op she was sent to ICU due to hypotension. She has some response to fluids but the primary team felt she may need more aggressive BP management. PCCM asked to see.   PAST MEDICAL HISTORY :   has a past medical history of Kidney stones.  has past surgical history that includes Lithotripsy. Prior to Admission medications   Not on File   No Known Allergies  FAMILY HISTORY:  has no family status information on file.  SOCIAL HISTORY:  reports that she has been smoking.  She does not have any smokeless tobacco history on file. She reports that she drinks alcohol. She reports that she does not use illicit drugs.  REVIEW OF SYSTEMS:   Review of Systems:   Bolds are positive  Constitutional: weight loss, gain, night sweats, Fevers, chills, fatigue .  HEENT: headaches, Sore throat, sneezing, nasal congestion, post nasal drip, Difficulty  swallowing, Tooth/dental problems, visual complaints visual changes, ear ache CV:  chest pain, radiates: ,Orthopnea, PND, swelling in lower extremities, dizziness, palpitations, syncope.  GI  heartburn, indigestion, abdominal pain, nausea, vomiting, diarrhea, change in bowel habits, loss of appetite, bloody stools.  Resp: cough, productive: , hemoptysis, dyspnea, chest pain, pleuritic.  Skin: rash or itching or icterus GU: dysuria, change in color of urine, urgency or frequency. flank pain, hematuria  MS: joint pain or swelling. decreased range of motion  Psych: change in mood or affect. depression or anxiety.  Neuro: difficulty with speech, weakness, numbness, ataxia    SUBJECTIVE:  Having some back and flank pain  VITAL SIGNS: Temp:  [94 F (34.4 C)-102.4 F (39.1 C)] 97.5 F (36.4 C) (09/30 1717) Pulse Rate:  [105-158] 124 (09/30 1732) Resp:  [17-45] 34 (09/30 1732) BP: (69-160)/(19-81) 82/43 mmHg (09/30 1732) SpO2:  [87 %-100 %] 100 % (09/30 1732) Weight:  [94 kg (207 lb 3.7 oz)] 94 kg (207 lb 3.7 oz) (09/30 1717) HEMODYNAMICS:   VENTILATOR SETTINGS:   INTAKE / OUTPUT:  Intake/Output Summary (Last 24 hours) at 06/26/14 1753 Last data filed at 06/26/14 1657  Gross per 24 hour  Intake   4500 ml  Output      5 ml  Net   4495 ml    PHYSICAL EXAMINATION: General:  Obese female in mild distress Neuro:  Alert, orients with time. Mild confusion. HEENT:  [place]/AT, PERRL, no JVD noted Cardiovascular:  Tachy, regular Lungs:  Clear anterior breath sounds, unlabored Abdomen:  Soft, non-tender, non - distended, Foley bag draining gray, thick urine.  Musculoskeletal:  No acute deformity Skin:  Diaphoretic, intact.   LABS:  CBC  Recent Labs Lab 06/26/14 1220  WBC 2.6*  HGB 9.8*  HCT 31.3*  PLT 199   Coag's No results found for this basename: APTT, INR,  in the last 168 hours BMET  Recent Labs Lab 06/26/14 1220  NA 138  K 4.4  CL 94*  CO2 16*  BUN 19   CREATININE 2.11*  GLUCOSE 114*   Electrolytes  Recent Labs Lab 06/26/14 1220  CALCIUM 9.6   Sepsis Markers  Recent Labs Lab 06/26/14 1318  LATICACIDVEN 4.24*   ABG No results found for this basename: PHART, PCO2ART, PO2ART,  in the last 168 hours Liver Enzymes  Recent Labs Lab 06/26/14 1220  AST 22  ALT 21  ALKPHOS 200*  BILITOT 0.9  ALBUMIN 2.8*   Cardiac Enzymes No results found for this basename: TROPONINI, PROBNP,  in the last 168 hours Glucose No results found for this basename: GLUCAP,  in the last 168 hours  Imaging No results found.   ASSESSMENT / PLAN:  PULMONARY A: No acute issues  P:   Supplemental O2 as needed to maintain SpO2 greater than 92% IS  CARDIOVASCULAR CVL  A:  Septic Shock - Pyelo source initially responding to IVF resuscitation  P:  Aggressive IVF resuscitation Will place CVC for pressors and to transduce CVP Will use phenylephrine if needed with tachycardia  Trend troponin Trend lactic acid  RENAL A:   Acute Hydronephrosis 2nd to urolithiasis Acute renal failure, obstructive nephropathy  P:   Urology following Foley Strict I/O  GASTROINTESTINAL A:   No acute issues  P:   NPO for now SUP: IV protonix  HEMATOLOGIC A:  Anemia Leukopenia, ? Due to sepsis  P:  Follow CBC Lovenox  INFECTIOUS A:   Severe sepsis, source pyeloneprhitis  P:   BCx2 9/30 >> UC  9/30 >> Abx: rocephin, start date 9/30, day 1/x Trend WBC and fever curve  ENDOCRINE A:   No acute issues  P:   Monitor CBG on chemistry  NEUROLOGIC A:   Acute metabolic encephalopathy, lethargic Pain management  P:   RASS goal: 0 PRN dilaudid/hydrocodone as BP tolerates.  Monitor  Joneen Roach, ACNP [facility] Pager [phone] or [phone]   TODAY'S SUMMARY:  I have personally obtained a history, examined the patient, evaluated laboratory and imaging results, formulated the assessment and plan  and placed orders. CRITICAL CARE: The patient is critically ill with multiple organ systems failure and requires high complexity decision making for assessment and support, frequent evaluation and titration of therapies, application of advanced monitoring technologies and extensive interpretation of multiple databases. Critical Care Time devoted to patient care services described in this note is 60 minutes.   Levy Pupa, MD, PhD 06/26/2014, 8:17 PM [facility] [phone] or if no answer [phone]

## 2014-06-26 NOTE — ED Notes (Signed)
MD at bedside. EDP PICKERING UPDATED ON PT CURRENT STATUS

## 2014-06-26 NOTE — ED Notes (Signed)
MD at bedside. 

## 2014-06-26 NOTE — Consult Note (Signed)
Urology Consult   Physician requesting consult: Dr. Rubin Payor  Reason for consult: Sepsis and right ureteral stone  History of Present Illness: Deanna Goodwin is a 53 y.o. With a history of urolithiasis.  She presented after developing severe right-sided flank and abdominal pain on Friday associated with nausea and vomiting and diarrhea.  She developed worsening pain and also developed fever and chills over the past 24 hours.  She has denied any gross hematuria. She has previously seen Dr. Sherron Monday and underwent ureteral stenting for a stone in 2012.  She was noted to be hypotensive and tachycardic in the emergency department.  She has received 2 liters of IV fluids and her heart rate remains 120 with a systolic blood pressure of 95.  Her urinalysis appears to be infected.  Urine cultures and blood cultures have been sent.  She was administered Rocephin.  She denies a history of voiding or storage urinary symptoms, hematuria, UTIs, STDs, GU malignancy/trauma/surgery.  Past Medical History  Diagnosis Date  . Kidney stones     Past Surgical History  Procedure Laterality Date  . Lithotripsy       Current Hospital Medications:  Home meds:    Medication List    Notice   You have not been prescribed any medications.      Scheduled Meds: . fentaNYL  50 mcg Intravenous Once   Continuous Infusions:  PRN Meds:.  Allergies: No Known Allergies  History reviewed. No pertinent family history.  Social History:  reports that she has been smoking.  She does not have any smokeless tobacco history on file. She reports that she drinks alcohol. She reports that she does not use illicit drugs.  Family history: She does have a brother who has had recurrent kidney stones.  ROS: A complete review of systems was performed.  All systems are negative except for pertinent findings as noted.  Physical Exam:  Vital signs in last 24 hours: Temp:  [94 F (34.4 C)-102.4 F (39.1 C)] 98.4  F (36.9 C) (09/30 1359) Pulse Rate:  [105-158] 124 (09/30 1500) Resp:  [17-45] 30 (09/30 1500) BP: (83-160)/(41-81) 106/48 mmHg (09/30 1500) SpO2:  [87 %-100 %] 100 % (09/30 1500) Constitutional:  Alert and oriented, No acute distress Cardiovascular: Regular rate and rhythm, No JVD Respiratory: Normal respiratory effort, Lungs clear bilaterally GI: She does have moderate to severe tenderness over her right flank and her right abdomen.  There is no rebound tenderness or guarding. GU: Moderate to severe right CVA tenderness. Lymphatic: No lymphadenopathy Neurologic: Grossly intact, no focal deficits Psychiatric: Normal mood and affect  Laboratory Data:   Recent Labs  06/26/14 1220  WBC 2.6*  HGB 9.8*  HCT 31.3*  PLT 199     Recent Labs  06/26/14 1220  NA 138  K 4.4  CL 94*  GLUCOSE 114*  BUN 19  CALCIUM 9.6  CREATININE 2.11*     Results for orders placed during the hospital encounter of 06/26/14 (from the past 24 hour(s))  URINALYSIS, ROUTINE W REFLEX MICROSCOPIC     Status: Abnormal   Collection Time    06/26/14 11:05 AM      Result Value Ref Range   Color, Urine ORANGE (*) YELLOW   APPearance CLOUDY (*) CLEAR   Specific Gravity, Urine 1.020  1.005 - 1.030   pH 5.0  5.0 - 8.0   Glucose, UA NEGATIVE  NEGATIVE mg/dL   Hgb urine dipstick SMALL (*) NEGATIVE   Bilirubin Urine SMALL (*) NEGATIVE  Ketones, ur NEGATIVE  NEGATIVE mg/dL   Protein, ur 782100 (*) NEGATIVE mg/dL   Urobilinogen, UA 1.0  0.0 - 1.0 mg/dL   Nitrite NEGATIVE  NEGATIVE   Leukocytes, UA SMALL (*) NEGATIVE  URINE MICROSCOPIC-ADD ON     Status: Abnormal   Collection Time    06/26/14 11:05 AM      Result Value Ref Range   Squamous Epithelial / LPF RARE  RARE   WBC, UA 7-10  <3 WBC/hpf   RBC / HPF 3-6  <3 RBC/hpf   Bacteria, UA MANY (*) RARE   Casts GRANULAR CAST (*) NEGATIVE   Urine-Other MUCOUS PRESENT    CBC WITH DIFFERENTIAL     Status: Abnormal   Collection Time    06/26/14 12:20 PM       Result Value Ref Range   WBC 2.6 (*) 4.0 - 10.5 K/uL   RBC 3.97  3.87 - 5.11 MIL/uL   Hemoglobin 9.8 (*) 12.0 - 15.0 g/dL   HCT 95.631.3 (*) 21.336.0 - 08.646.0 %   MCV 78.8  78.0 - 100.0 fL   MCH 24.7 (*) 26.0 - 34.0 pg   MCHC 31.3  30.0 - 36.0 g/dL   RDW 57.818.9 (*) 46.911.5 - 62.915.5 %   Platelets 199  150 - 400 K/uL   Neutrophils Relative % 58  43 - 77 %   Neutro Abs 1.5 (*) 1.7 - 7.7 K/uL   Lymphocytes Relative 37  12 - 46 %   Lymphs Abs 1.0  0.7 - 4.0 K/uL   Monocytes Relative 2 (*) 3 - 12 %   Monocytes Absolute 0.0 (*) 0.1 - 1.0 K/uL   Eosinophils Relative 3  0 - 5 %   Eosinophils Absolute 0.1  0.0 - 0.7 K/uL   Basophils Relative 0  0 - 1 %   Basophils Absolute 0.0  0.0 - 0.1 K/uL  COMPREHENSIVE METABOLIC PANEL     Status: Abnormal   Collection Time    06/26/14 12:20 PM      Result Value Ref Range   Sodium 138  137 - 147 mEq/L   Potassium 4.4  3.7 - 5.3 mEq/L   Chloride 94 (*) 96 - 112 mEq/L   CO2 16 (*) 19 - 32 mEq/L   Glucose, Bld 114 (*) 70 - 99 mg/dL   BUN 19  6 - 23 mg/dL   Creatinine, Ser 5.282.11 (*) 0.50 - 1.10 mg/dL   Calcium 9.6  8.4 - 41.310.5 mg/dL   Total Protein 7.8  6.0 - 8.3 g/dL   Albumin 2.8 (*) 3.5 - 5.2 g/dL   AST 22  0 - 37 U/L   ALT 21  0 - 35 U/L   Alkaline Phosphatase 200 (*) 39 - 117 U/L   Total Bilirubin 0.9  0.3 - 1.2 mg/dL   GFR calc non Af Amer 26 (*) >90 mL/min   GFR calc Af Amer 30 (*) >90 mL/min   Anion gap 28 (*) 5 - 15  LIPASE, BLOOD     Status: Abnormal   Collection Time    06/26/14 12:20 PM      Result Value Ref Range   Lipase 7 (*) 11 - 59 U/L  I-STAT CG4 LACTIC ACID, ED     Status: Abnormal   Collection Time    06/26/14  1:18 PM      Result Value Ref Range   Lactic Acid, Venous 4.24 (*) 0.5 - 2.2 mmol/L   No results found  for this or any previous visit (from the past 240 hour(s)).  Renal Function:  Recent Labs  06/26/14 1220  CREATININE 2.11*   CrCl is unknown because there is no height on file for the current visit.  Radiologic  Imaging: Ct Abdomen Pelvis Wo Contrast  06/26/2014   CLINICAL DATA:  Right-sided flank pain  EXAM: CT ABDOMEN AND PELVIS WITHOUT CONTRAST  TECHNIQUE: Multidetector CT imaging of the abdomen and pelvis was performed following the standard protocol without IV contrast.  COMPARISON:  03/19/2011  FINDINGS: The lung bases demonstrate some right basilar atelectasis.  The liver, gallbladder, spleen, adrenal glands and pancreas are within normal limits. The left kidney is well visualized and shows to a tiny nonobstructing stone in the lower pole. The right kidney demonstrates moderate hydronephrosis and hydroureter secondary to a 6 mm stone just above the ureteropelvic junction. Additionally multiple nonobstructing renal calculi are noted in the right kidney particular in the upper and mid polar region suggesting early staghorn calculus. The appendix is within normal limits.  The bladder is partially distended. Uterine calcifications consistent with fibroid disease are noted. Minimal free fluid is noted likely physiologic in nature.  IMPRESSION: Early right staghorn calculus.  Right hydronephrosis and hydroureter secondary to a 6 mm distal ureteral stone.  Tiny nonobstructing left renal stone in the lower pole.   Electronically Signed   By: Alcide Clever M.D.   On: 06/26/2014 13:45   Dg Chest Port 1 View  06/26/2014   CLINICAL DATA:  Shortness of breath, cough, chest pain and fever for 6 days, history smoking  EXAM: PORTABLE CHEST - 1 VIEW  COMPARISON:  Portable exam 1327 hr without priors for comparison  FINDINGS: Enlargement of cardiac silhouette with pulmonary vascular congestion.  Mediastinal contours and pulmonary vascularity normal.  Subsegmental atelectasis at RIGHT base.  Remaining lungs clear.  No pleural effusion or pneumothorax.  Bones unremarkable.  IMPRESSION: Enlargement of cardiac silhouette with pulmonary vascular congestion.  Subsegmental atelectasis at RIGHT base.   Electronically Signed   By: Ulyses Southward M.D.   On: 06/26/2014 13:46    I independently reviewed the above imaging studies.  Impression/Recommendation: Right ureteral stone with sepsis: I spoke with the patient regarding her potentially life-threatening situation and infection.  I recommended that she proceed expediently to the operating room for cystoscopy and ureteral stent placement followed by admission to the intensive care unit for continued fluid resuscitation and ongoing antibiotic therapy.  The potential risks and complications as well as the expected recovery process was discussed in detail with her and she gives her informed consent.  She understands that she will require definitive stone therapy after full treatment of her infection over the next few weeks.  Lilybeth Vien,LES 06/26/2014, 3:12 PM  Moody Bruins. MD   CC: Dr. Rubin Payor

## 2014-06-26 NOTE — Progress Notes (Signed)
Co-Oximetery ran on [id number] ABG machine.  Accession number [id number] not crossing over properly to sunquest.  Dr. Vassie LollAlva called and notified with the results.  Hgb = 8.1 g/dl and sO2 = 16.1%56.8%

## 2014-06-27 ENCOUNTER — Encounter (HOSPITAL_COMMUNITY): Payer: Self-pay | Admitting: Urology

## 2014-06-27 DIAGNOSIS — N138 Other obstructive and reflux uropathy: Secondary | ICD-10-CM

## 2014-06-27 DIAGNOSIS — R6521 Severe sepsis with septic shock: Secondary | ICD-10-CM

## 2014-06-27 DIAGNOSIS — D696 Thrombocytopenia, unspecified: Secondary | ICD-10-CM

## 2014-06-27 DIAGNOSIS — D649 Anemia, unspecified: Secondary | ICD-10-CM

## 2014-06-27 DIAGNOSIS — G9341 Metabolic encephalopathy: Secondary | ICD-10-CM

## 2014-06-27 DIAGNOSIS — A419 Sepsis, unspecified organism: Principal | ICD-10-CM

## 2014-06-27 DIAGNOSIS — N390 Urinary tract infection, site not specified: Secondary | ICD-10-CM

## 2014-06-27 DIAGNOSIS — N132 Hydronephrosis with renal and ureteral calculous obstruction: Secondary | ICD-10-CM

## 2014-06-27 DIAGNOSIS — Z720 Tobacco use: Secondary | ICD-10-CM

## 2014-06-27 LAB — CBC
HCT: 22.6 % — ABNORMAL LOW (ref 36.0–46.0)
HEMOGLOBIN: 7.7 g/dL — AB (ref 12.0–15.0)
MCH: 25.8 pg — AB (ref 26.0–34.0)
MCHC: 34.1 g/dL (ref 30.0–36.0)
MCV: 75.8 fL — AB (ref 78.0–100.0)
Platelets: 108 10*3/uL — ABNORMAL LOW (ref 150–400)
RBC: 2.98 MIL/uL — AB (ref 3.87–5.11)
RDW: 19.5 % — ABNORMAL HIGH (ref 11.5–15.5)
WBC: 16.2 10*3/uL — ABNORMAL HIGH (ref 4.0–10.5)

## 2014-06-27 LAB — BASIC METABOLIC PANEL
Anion gap: 13 (ref 5–15)
BUN: 20 mg/dL (ref 6–23)
CALCIUM: 6.8 mg/dL — AB (ref 8.4–10.5)
CO2: 18 mEq/L — ABNORMAL LOW (ref 19–32)
CREATININE: 1.52 mg/dL — AB (ref 0.50–1.10)
Chloride: 103 mEq/L (ref 96–112)
GFR calc Af Amer: 45 mL/min — ABNORMAL LOW (ref >90)
GFR calc non Af Amer: 39 mL/min — ABNORMAL LOW (ref >90)
GLUCOSE: 131 mg/dL — AB (ref 70–99)
Potassium: 4.4 mEq/L (ref 3.7–5.3)
SODIUM: 134 meq/L — AB (ref 137–147)

## 2014-06-27 LAB — CARBOXYHEMOGLOBIN
Carboxyhemoglobin: 1.5 % (ref 0.5–1.5)
Methemoglobin: 1.4 % (ref 0.0–1.5)
O2 Saturation: 56.8 %
TOTAL HEMOGLOBIN: 8.1 g/dL — AB (ref 12.0–16.0)

## 2014-06-27 LAB — LACTIC ACID, PLASMA
Lactic Acid, Venous: 2.4 mmol/L — ABNORMAL HIGH (ref 0.5–2.2)
Lactic Acid, Venous: 1.8 mmol/L (ref 0.5–2.2)

## 2014-06-27 MED ORDER — SIMETHICONE 80 MG PO CHEW
160.0000 mg | CHEWABLE_TABLET | Freq: Four times a day (QID) | ORAL | Status: DC | PRN
  Administered 2014-06-27: 160 mg via ORAL
  Filled 2014-06-27 (×2): qty 2

## 2014-06-27 MED ORDER — CALCIUM CARBONATE ANTACID 500 MG PO CHEW
1.0000 | CHEWABLE_TABLET | Freq: Two times a day (BID) | ORAL | Status: DC | PRN
  Administered 2014-06-27: 200 mg via ORAL
  Filled 2014-06-27: qty 1

## 2014-06-27 MED ORDER — ALUM & MAG HYDROXIDE-SIMETH 200-200-20 MG/5ML PO SUSP
15.0000 mL | Freq: Four times a day (QID) | ORAL | Status: DC | PRN
  Administered 2014-06-27 – 2014-06-29 (×4): 15 mL via ORAL
  Filled 2014-06-27 (×5): qty 30

## 2014-06-27 NOTE — Progress Notes (Signed)
PULMONARY / CRITICAL CARE MEDICINE   Name: Deanna Goodwin MRN: [id number] DOB: [birth year]    ADMISSION DATE:  06/26/2014 CONSULTATION DATE:  06/26/2014  REFERRING MD :  Dr. Laverle PatterBorden (Urology)   CHIEF COMPLAINT:  Flank pain  INITIAL PRESENTATION: 53 year old female with history of urolithiasis admitted 9/30 for R urolithiasis with sepsis. She was taken to OR and had ureteral stent placed. To ICU post-op, hypotensive. PCCM asked to evaluate.    STUDIES:  9/30 CT abd/pelv > Right hydronephrosis and hydroureter secondary to a 6 mm distal ureteral stone. Early R staghorn calculus, Non-obstructing L stone.   SIGNIFICANT EVENTS: 9/30   Admitted, to OR, ureteral stent placed. To ICU post-op 10/01 VSS, no acute events.  Good UOP.  Off pressors.   SUBJECTIVE: Pt reports mild tightness in lower back.  No overt pain. UOP improved from thick grey to tea colored.  VITAL SIGNS: Temp:  [94 F (34.4 C)-102.4 F (39.1 C)] 97.9 F (36.6 C) (10/01 0400) Pulse Rate:  [101-158] 106 (10/01 0600) Resp:  [16-45] 24 (10/01 0600) BP: (69-160)/(19-82) 103/69 mmHg (10/01 0600) SpO2:  [87 %-100 %] 97 % (10/01 0600) Weight:  [207 lb 3.7 oz (94 kg)-217 lb 2.5 oz (98.5 kg)] 217 lb 2.5 oz (98.5 kg) (10/01 0400)  HEMODYNAMICS: CVP:  [20 mmHg-28 mmHg] 28 mmHg  INTAKE / OUTPUT:  Intake/Output Summary (Last 24 hours) at 06/27/14 0811 Last data filed at 06/27/14 0600  Gross per 24 hour  Intake 8004.25 ml  Output    515 ml  Net 7489.25 ml    PHYSICAL EXAMINATION: General:  Obese female in NAD Neuro:  Alert, oriented, delayed answering, ? Mild situational confusion HEENT:  [place]/AT, PERRL, no JVD noted Cardiovascular:  Tachy, regular Lungs:  Clear anterior breath sounds, unlabored Abdomen:  Soft, non-tender, non - distended, Foley bag draining tea colored urine (improved).  Musculoskeletal:  No acute deformity Skin:  Warm/dry, no edema.   LABS:  CBC  Recent Labs Lab 06/26/14 1220 06/26/14 1743  06/27/14 0610  WBC 2.6* 9.7 16.2*  HGB 9.8* 7.9* 7.7*  HCT 31.3* 24.6* 22.6*  PLT 199 98* 108*   BMET  Recent Labs Lab 06/26/14 1220 06/26/14 1743 06/27/14 0610  NA 138 136* 134*  K 4.4 3.2* 4.4  CL 94* 103 103  CO2 16* 16* 18*  BUN 19 19 20   CREATININE 2.11* 2.06* 1.52*  GLUCOSE 114* 119* 131*   Electrolytes  Recent Labs Lab 06/26/14 1220 06/26/14 1743 06/27/14 0610  CALCIUM 9.6 7.2* 6.8*   Sepsis Markers  Recent Labs Lab 06/26/14 1743 06/27/14 0055 06/27/14 0610  LATICACIDVEN 2.7* 2.4* 1.8   Liver Enzymes  Recent Labs Lab 06/26/14 1220 06/26/14 1743  AST 22 27  ALT 21 18  ALKPHOS 200* 139*  BILITOT 0.9 1.1  ALBUMIN 2.8* 1.9*   Cardiac Enzymes  Recent Labs Lab 06/26/14 1743  TROPONINI <0.30   Imaging Ct Abdomen Pelvis Wo Contrast  06/26/2014   CLINICAL DATA:  Right-sided flank pain  EXAM: CT ABDOMEN AND PELVIS WITHOUT CONTRAST  TECHNIQUE: Multidetector CT imaging of the abdomen and pelvis was performed following the standard protocol without IV contrast.  COMPARISON:  03/19/2011  FINDINGS: The lung bases demonstrate some right basilar atelectasis.  The liver, gallbladder, spleen, adrenal glands and pancreas are within normal limits. The left kidney is well visualized and shows to a tiny nonobstructing stone in the lower pole. The right kidney demonstrates moderate hydronephrosis and hydroureter secondary to a 6  mm stone just above the ureteropelvic junction. Additionally multiple nonobstructing renal calculi are noted in the right kidney particular in the upper and mid polar region suggesting early staghorn calculus. The appendix is within normal limits.  The bladder is partially distended. Uterine calcifications consistent with fibroid disease are noted. Minimal free fluid is noted likely physiologic in nature.  IMPRESSION: Early right staghorn calculus.  Right hydronephrosis and hydroureter secondary to a 6 mm distal ureteral stone.  Tiny nonobstructing  left renal stone in the lower pole.   Electronically Signed   By: Alcide Clever M.D.   On: 06/26/2014 13:45   Dg Chest Port 1 View  06/26/2014   CLINICAL DATA:  Central line placement.  EXAM: PORTABLE CHEST - 1 VIEW  COMPARISON:  Earlier film, same date.  FINDINGS: The right IJ central venous catheter tip is in the region of the cavoatrial junction. No complicating features. The heart and lungs are stable. Slightly lower lung volumes.  IMPRESSION: Right IJ central venous catheter tip is near the cavoatrial junction.   Electronically Signed   By: Loralie Champagne M.D.   On: 06/26/2014 21:16   Dg Chest Port 1 View  06/26/2014   CLINICAL DATA:  Shortness of breath, cough, chest pain and fever for 6 days, history smoking  EXAM: PORTABLE CHEST - 1 VIEW  COMPARISON:  Portable exam 1327 hr without priors for comparison  FINDINGS: Enlargement of cardiac silhouette with pulmonary vascular congestion.  Mediastinal contours and pulmonary vascularity normal.  Subsegmental atelectasis at RIGHT base.  Remaining lungs clear.  No pleural effusion or pneumothorax.  Bones unremarkable.  IMPRESSION: Enlargement of cardiac silhouette with pulmonary vascular congestion.  Subsegmental atelectasis at RIGHT base.   Electronically Signed   By: Ulyses Southward M.D.   On: 06/26/2014 13:46     ASSESSMENT / PLAN:  PULMONARY A: Atelectasis. Tobacco abuse. P:   Supplemental O2 as needed to maintain SpO2 greater than 92% IS Smoking cessation  CARDIOVASCULAR CVL R IJ 9/30 >> A:  Septic Shock - Pyelo source, responded to IVF resuscitation. P:  Continue IV fluids Likely can d/c CVL on 10/02 if she remains stable  RENAL A:   Acute renal failure 2nd to Rt ureteral/renal stones with hydronephrosis s/p cystoscopy and ureteral stent 9/30.  Elevated Lactate - resolved.  P:   Monitor renal fx, urine outpt  GASTROINTESTINAL A:   Nutrition. Dyspepsia. P:   Regular diet as tolerated  TUMS/maalox for indigestion  PRN  HEMATOLOGIC A:  Anemia, thrombocytopenia 2nd to critical illness. P:  F/u CBC Lovenox for DVT prevention  INFECTIOUS A:   Septic shock 2nd to pyelonephritis >> likely had transient bacteremia after urologic procedure. P:   BCx2 9/30 >>  UC  9/30 >> Abx: rocephin, start date 9/30, day 2/x Trend WBC and fever curve  ENDOCRINE A:   No acute issues. P:   Monitor CBG on chemistry  NEUROLOGIC A:   Acute metabolic encephalopathy, improving.  Pain management P:   PRN dilaudid/hydrocodone as BP tolerates.  Monitor   TODAY'S SUMMARY: 53 y/o F admitted with urolithiasis & severe sepsis s/p ureteral stent placement 9/30.  Resolved hypotension.  Change to SDU status.  Likely can move out to floor 10/2.  PCCM will be available PRN.  Please call if needs arise.    Canary Brim, NP-C [facility] Pgr: [phone] or [phone] 06/27/2014, 8:11 AM  Reviewed above, examined.  She likely had transient bacteremia after urology procedure leading to  septic shock in setting of Rt pyelonephritis with rt sided stones and hydronephrosis.  Clinically improving.  Continue current Abx pending cx results.  Will change to SDU status.  Likely can d/c CVL on 10/02 if she is stable.    PCCM will sign off.  Recommend consulting [facility] hospitalist service if additional medical assistance is needed.  Coralyn Helling, MD [facility] Pulmonary/Critical Care 06/27/2014, 9:43 AM Pager:  [phone] After 3pm call: [phone]

## 2014-06-27 NOTE — Progress Notes (Signed)
Patient ID: Deanna Goodwin, female   DOB: 1961-07-08, 53 y.o.   MRN: [id number]  1 Day Post-Op Subjective: Pt feeling better today.  Still with mild right flank pain and complains of indigestion.  Objective: Vital signs in last 24 hours: Temp:  [94 F (34.4 C)-102.4 F (39.1 C)] 97.6 F (36.4 C) (10/01 0800) Pulse Rate:  [101-158] 104 (10/01 1000) Resp:  [16-45] 28 (10/01 1000) BP: (69-160)/(19-97) 130/97 mmHg (10/01 1000) SpO2:  [87 %-100 %] 97 % (10/01 1000) Weight:  [94 kg (207 lb 3.7 oz)-98.5 kg (217 lb 2.5 oz)] 98.5 kg (217 lb 2.5 oz) (10/01 0400)  Intake/Output from previous day: 09/30 0701 - 10/01 0700 In: 8014.3 [P.O.:480; I.V.:5434.3; IV Piggyback:2100] Out: 515 [Urine:515] Intake/Output this shift: Total I/O In: 30 [I.V.:30] Out: 125 [Urine:125]  Physical Exam:  General: Alert and oriented CV: RRR Lungs: Clear Abdomen: Soft, ND, Minimal CVAT GU: Urine now clearer with less purulence Ext: NT, No erythema  Lab Results:  Recent Labs  06/26/14 1220 06/26/14 1743 06/27/14 0610  HGB 9.8* 7.9* 7.7*  HCT 31.3* 24.6* 22.6*   Lab Results  Component Value Date   WBC 16.2* 06/27/2014   HGB 7.7* 06/27/2014   HCT 22.6* 06/27/2014   MCV 75.8* 06/27/2014   PLT 108* 06/27/2014     BMET  Recent Labs  06/26/14 1743 06/27/14 0610  NA 136* 134*  K 3.2* 4.4  CL 103 103  CO2 16* 18*  GLUCOSE 119* 131*  BUN 19 20  CREATININE 2.06* 1.52*  CALCIUM 7.2* 6.8*     Studies/Results:  Assessment/Plan: 1) Right ureteral stone and sepsis: Pt now appears hemodynamically stable.  Will continue to monitor in ICU today with re-evaluation this afternoon.  If she remains stable, can likely D/C central line later today and transfer to floor. Await culture results and continue ceftriaxone.  Will need 2 weeks of appropriate antibiotic therapy prior to definitive stone treatment.   LOS: 1 day   Marykate Heuberger,LES 06/27/2014, 11:28 AM

## 2014-06-27 NOTE — Progress Notes (Signed)
eLink Physician-Brief Progress Note Patient Name: Deanna FetchCherina E Seier DOB: 07/11/1961 MRN: [id number]   Date of Service  06/27/2014  HPI/Events of Note  Really bad gas  eICU Interventions  Gas-ex, extra-strength dose     Intervention Category Minor Interventions: Routine modifications to care plan (e.g. PRN medications for pain, fever)  Sanoe Hazan 06/27/2014, 4:17 PM

## 2014-06-27 NOTE — Progress Notes (Signed)
Patient ID: [name], female   DOB: 05-19-1961, 53 y.o.   MRN: [id number]  Pt doing well. She has remained hemodynamically stable throughout the day. Will transfer to the floor.

## 2014-06-27 NOTE — Clinical Documentation Improvement (Addendum)
  Abnormal findings (laboratory, x-ray, MRI/CT scans, and other diagnostic results) are not coded and reported unless the physician indicates their clinical significance. The medical record reflects the following abnormal blood culture results. If possible, please help by clarifying the clinical significance of these abnormal findings. Thank you!  Specimen Description  BLOOD RIGHT FOREARM   Special Requests  BOTTLES DRAWN AEROBIC AND ANAEROBIC 4ML   Culture Setup Time  06/26/2014 18:24 Performed at [facility]  Culture  GRAM NEGATIVE RODS Note: Gram Stain Report Called to,Read Back By and Verified With: Valla LeaverBRIANNE WILLIAMS @ 1457 ON 100115 BY [facility] Performed at [facility]  Report Status  PENDING     Specimen Description  BLOOD RIGHT ANTECUBITAL   Special Requests  BOTTLES DRAWN AEROBIC AND ANAEROBIC 5ML   Culture Setup Time  06/26/2014 18:24 Performed at [facility]  Culture  GRAM NEGATIVE RODS Note: Gram Stain Report Called to,Read Back By and Verified With: Valla LeaverBRIANNE WILLIAMS @ 1155 ON 100115 BY [facility] Performed at [facility]  Report Status  PENDING    Thank you for your time with this!   Servando SnareSalena Ademide Schaberg, RN Clinical Documentation Improvement Specialist ([phone]) [phone] / [phone]

## 2014-06-27 NOTE — Clinical Documentation Improvement (Signed)
  Abnormal findings (laboratory, x-ray, MRI/CT scans, and other diagnostic results) are not coded and reported unless the physician indicates their clinical significance. The medical record reflects the following abnormal laboratory findings. If possible, please help by clarifying the clinical significance of these abnormal findings. Thank you!   Component      Lactic Acid, Venous  Latest Ref Rng      0.5 - 2.2 mmol/L  06/26/2014     1:18 PM 4.24 (H)  06/26/2014     5:43 PM 2.7 (H)  06/27/2014     12:55 AM 2.4 (H)     Thank you for your time with this!   Servando SnareSalena Marabella Popiel, RN Clinical Documentation Improvement Specialist ([phone]) [phone] / [phone]

## 2014-06-27 NOTE — Care Management Note (Signed)
    Page 1 of 2   06/27/2014     11:52:40 AM CARE MANAGEMENT NOTE 06/27/2014  Patient:  Deanna Goodwin,Deanna Goodwin   Account Number:  [id number]  Date Initiated:  06/27/2014  Documentation initiated by:  DAVIS,RHONDA  Subjective/Objective Assessment:   sepsis uro origin     Action/Plan:   home when stable   Anticipated DC Date:  06/30/2014   Anticipated DC Plan:  HOME/SELF CARE  In-house referral  NA      DC Planning Services  CM consult      PAC Choice  NA  NA   Choice offered to / List presented to:  NA   DME arranged  NA      DME agency  NA     HH arranged  NA      HH agency  NA   Status of service:  In process, will continue to follow Medicare Important Message given?   (If response is "NO", the following Medicare IM given date fields will be blank) Date Medicare IM given:   Medicare IM given by:   Date Additional Medicare IM given:   Additional Medicare IM given by:    Discharge Disposition:    Per UR Regulation:  Reviewed for med. necessity/level of care/duration of stay  If discussed at Long Length of Stay Meetings, dates discussed:    Comments:  [id number]/Rhonda Earlene Plateravis, RN, BSN, CCM Chart reviewed. Discharge needs and patient's stay to be reviewed and followed by case manager.

## 2014-06-27 NOTE — Progress Notes (Signed)
Patient transferred for ICU/Stepdown to room 1410. Patient arrived via wheelchair. Patient able to self transfer from wheelchair to bed, bilateral leg weakness noted. Patient educated to call for assistance before attempting to get up from bed. Patient is alert and oriented x4, does have some delayed responses. Patient denies pain, no respiratory difficulty noted.

## 2014-06-28 LAB — BASIC METABOLIC PANEL
Anion gap: 13 (ref 5–15)
BUN: 15 mg/dL (ref 6–23)
CALCIUM: 7.2 mg/dL — AB (ref 8.4–10.5)
CO2: 18 meq/L — AB (ref 19–32)
Chloride: 100 mEq/L (ref 96–112)
Creatinine, Ser: 0.9 mg/dL (ref 0.50–1.10)
GFR calc Af Amer: 84 mL/min — ABNORMAL LOW (ref >90)
GFR, EST NON AFRICAN AMERICAN: 73 mL/min — AB (ref >90)
GLUCOSE: 97 mg/dL (ref 70–99)
Potassium: 3.7 mEq/L (ref 3.7–5.3)
Sodium: 131 mEq/L — ABNORMAL LOW (ref 137–147)

## 2014-06-28 LAB — CBC
HEMATOCRIT: 22.8 % — AB (ref 36.0–46.0)
Hemoglobin: 7.5 g/dL — ABNORMAL LOW (ref 12.0–15.0)
MCH: 25.1 pg — AB (ref 26.0–34.0)
MCHC: 32.9 g/dL (ref 30.0–36.0)
MCV: 76.3 fL — AB (ref 78.0–100.0)
PLATELETS: 141 10*3/uL — AB (ref 150–400)
RBC: 2.99 MIL/uL — ABNORMAL LOW (ref 3.87–5.11)
RDW: 19.5 % — ABNORMAL HIGH (ref 11.5–15.5)
WBC: 15 10*3/uL — ABNORMAL HIGH (ref 4.0–10.5)

## 2014-06-28 MED ORDER — DEXTROSE 5 % IV SOLN
2.0000 g | INTRAVENOUS | Status: DC
  Administered 2014-06-28 – 2014-06-29 (×2): 2 g via INTRAVENOUS
  Filled 2014-06-28 (×2): qty 2

## 2014-06-28 MED ORDER — CALCIUM CARBONATE ANTACID 500 MG PO CHEW: 1.0000 | CHEWABLE_TABLET | Freq: Two times a day (BID) | ORAL | Status: DC | PRN

## 2014-06-28 NOTE — Progress Notes (Signed)
Patient ID: Deanna Goodwin, female   DOB: [birth year], 53 y.o.   MRN: [id number]  2 Days Post-Op Subjective: Pt feeling much better today.  Minimal pain at this time.  She has remained hemodynamically stable.  Objective: Vital signs in last 24 hours: Temp:  [97.3 F (36.3 C)-97.8 F (36.6 C)] 97.8 F (36.6 C) (10/02 0429) Pulse Rate:  [95-104] 95 (10/02 0429) Resp:  [16-28] 16 (10/02 0429) BP: (102-130)/(73-97) 119/78 mmHg (10/02 0429) SpO2:  [96 %-100 %] 99 % (10/02 0429) Weight:  [98.5 kg (217 lb 2.5 oz)] 98.5 kg (217 lb 2.5 oz) (10/01 2049)  Intake/Output from previous day: 10/01 0701 - 10/02 0700 In: 742.5 [I.V.:692.5; IV Piggyback:50] Out: 1700 [Urine:1700] Intake/Output this shift:    Physical Exam:  General: Alert and oriented CV: RRR Lungs: Clear Abdomen: Soft, ND, Minimal CVAT Ext: NT, No erythema  Lab Results:  Recent Labs  06/26/14 1743 06/27/14 0610 06/28/14 0530  HGB 7.9* 7.7* 7.5*  HCT 24.6* 22.6* 22.8*   CBC Latest Ref Rng 06/28/2014 06/27/2014 06/26/2014  WBC 4.0 - 10.5 K/uL 15.0(H) 16.2(H) 9.7  Hemoglobin 12.0 - 15.0 g/dL 7.5(L) 7.7(L) 7.9(L)  Hematocrit 36.0 - 46.0 % 22.8(L) 22.6(L) 24.6(L)  Platelets 150 - 400 K/uL 141(L) 108(L) 98(L)     BMET  Recent Labs  06/27/14 0610 06/28/14 0530  NA 134* 131*  K 4.4 3.7  CL 103 100  CO2 18* 18*  GLUCOSE 131* 97  BUN 20 15  CREATININE 1.52* 0.90  CALCIUM 6.8* 7.2*     Studies/Results:  Blood culture: GNRs Urine culture:  Do not see that a urine culture was sent despite reassurances from ED providers that this was done.  Will check on this further.  Assessment/Plan: 1) Sepsis secondary to pyelonephritis and obstructing ureteral stone s/p stent placement: Pt is improving.  Continue ceftriaxone awaiting final culture results.  Unfortunately, it appears that urine culture was not sent despite reassurances from ED providers. Blood cultures are growing GNRs consistent with GU source.  Pt can  likely be discharged once sensitivities are back on oral antibiotic therapy for 2 weeks.  Will arrange follow up as outpatient and to set up definitive stone treatment once infection is treated.  Remove Foley today.   LOS: 2 days   Regine Christian,LES 06/28/2014, 8:00 AM

## 2014-06-28 NOTE — Progress Notes (Signed)
Pharmacy - Brief note (pharmacy may adjust antibiotic dosing)  53 YOF presents with urolithiasis s/p ureteral stent 9/30.  Blood cultures reveal GNR 2/2 (urine culture cancelled)  Day #3 ceftriaxone   Plan:  Due to bacteremia, increase ceftriaxone to 2gm IV q24h  No further dose adjustment needed   Follow final culture results  Juliette Alcideustin Zeigler, PharmD, BCPS.   Pager: [phone] 06/28/2014 8:40 AM

## 2014-06-28 NOTE — Progress Notes (Signed)
Agree with previous nurse assessment, will continue to monitor pts.  

## 2014-06-29 LAB — CULTURE, BLOOD (ROUTINE X 2)

## 2014-06-29 MED ORDER — CIPROFLOXACIN HCL 500 MG PO TABS: 500.0000 mg | ORAL_TABLET | Freq: Two times a day (BID) | ORAL | Status: DC

## 2014-06-29 MED ORDER — HYDROCODONE-ACETAMINOPHEN 5-325 MG PO TABS: 1.0000 | ORAL_TABLET | Freq: Four times a day (QID) | ORAL | Status: DC | PRN

## 2014-06-29 MED ORDER — ONDANSETRON 4 MG PO TBDP: 4.0000 mg | ORAL_TABLET | Freq: Three times a day (TID) | ORAL | Status: DC | PRN

## 2014-06-29 MED ORDER — TAMSULOSIN HCL 0.4 MG PO CAPS: 0.4000 mg | ORAL_CAPSULE | Freq: Every day | ORAL | Status: DC

## 2014-06-29 NOTE — Discharge Instructions (Signed)
1. You may see some blood in the urine and may have some burning with urination for 48-72 hours. You also may notice that you have to urinate more frequently or urgently after your procedure which is normal.  °2. You should call should you develop an inability urinate, fever > 101, persistent nausea and vomiting that prevents you from eating or drinking to stay hydrated.  °3. You have a stent. You will likely urinate more frequently and urgently until the stent is removed and you may experience some discomfort/pain in the lower abdomen and flank especially when urinating. You may take pain medication prescribed to you if needed for pain. You may also intermittently have blood in the urine until the stent is removed. °

## 2014-06-29 NOTE — Discharge Summary (Signed)
Physician Discharge Summary  Patient ID: Deanna Goodwin MRN: [id number] DOB/AGE: 10/30/1960 53 y.o.  Admit date: 06/26/2014 Discharge date: 06/29/2014  Admission Diagnoses:  Discharge Diagnoses:  Active Problems:   Sepsis due to urinary tract infection   Septic shock(785.52)   Obstructive nephropathy   Discharged Condition: stable  Hospital Course: The patient underwent emergent right ureteral stent on 06/26/2014 for urosepsis and obstructing ureteral stone. They tolerated the procedure well and recovered on the floor without complication. Her blood cultures were positive for pan sensitive E. Coli.  Their diet was advanced to regular. They were ambulatory. Their pain was controlled with PO pain meds. They were voiding without difficulty. By POD#3 they had met all criteria for discharge.   Treatments: surgery: right ureteral stent  Discharge Exam: Blood pressure 131/74, pulse 79, temperature 98.3 F (36.8 C), temperature source Oral, resp. rate 18, height $RemoveBe'5\' 3"'[id number]$  (1.6 m), weight 95.8 kg (211 lb 3.2 oz), last menstrual period 05/30/2014, SpO2 98.00%. AAOx3, in NAD. normal WOB.  Abdomen soft, NT/ND.  Extremities WWP, no edema   Disposition: 01-Home or Self Care     Medication List         ciprofloxacin 500 MG tablet  Commonly known as:  CIPRO  Take 1 tablet (500 mg total) by mouth 2 (two) times daily.     HYDROcodone-acetaminophen 5-325 MG per tablet  Commonly known as:  NORCO  Take 1 tablet by mouth every 6 (six) hours as needed for moderate pain.     ondansetron 4 MG disintegrating tablet  Commonly known as:  ZOFRAN ODT  Take 1 tablet (4 mg total) by mouth every 8 (eight) hours as needed for nausea or vomiting.     tamsulosin 0.4 MG Caps capsule  Commonly known as:  FLOMAX  Take 1 capsule (0.4 mg total) by mouth daily after supper.         SignedWynetta Emery, Charika Mikelson C 06/29/2014, 9:57 AM

## 2014-06-29 NOTE — Progress Notes (Signed)
Patient ID: Deanna Goodwin, female   DOB: 1961-01-05, 53 y.o.   MRN: [id number]  3 Days Post-Op Subjective: Doing well, no complaints today. Minimal pain and hemodynamically stable. Foley  Has been removed and voiding well.  Objective: Vital signs in last 24 hours: Temp:  [97.9 F (36.6 C)-98.3 F (36.8 C)] 98.3 F (36.8 C) (10/03 0440) Pulse Rate:  [79-88] 79 (10/03 0440) Resp:  [16-18] 18 (10/03 0440) BP: (122-131)/(69-74) 131/74 mmHg (10/03 0440) SpO2:  [98 %-100 %] 98 % (10/03 0440) Weight:  [95.8 kg (211 lb 3.2 oz)] 95.8 kg (211 lb 3.2 oz) (10/03 0440)  Intake/Output from previous day: 10/02 0701 - 10/03 0700 In: 2063.3 [P.O.:480; I.V.:1583.3] Out: 1400 [Urine:1400] Intake/Output this shift:    Physical Exam:  General: Alert and oriented CV: RRR Lungs: Clear Abdomen: Soft, ND, Minimal CVAT Ext: NT, No erythema  Lab Results:  Recent Labs  06/26/14 1743 06/27/14 0610 06/28/14 0530  HGB 7.9* 7.7* 7.5*  HCT 24.6* 22.6* 22.8*   CBC Latest Ref Rng 06/28/2014 06/27/2014 06/26/2014  WBC 4.0 - 10.5 K/uL 15.0(H) 16.2(H) 9.7  Hemoglobin 12.0 - 15.0 g/dL 7.5(L) 7.7(L) 7.9(L)  Hematocrit 36.0 - 46.0 % 22.8(L) 22.6(L) 24.6(L)  Platelets 150 - 400 K/uL 141(L) 108(L) 98(L)     BMET  Recent Labs  06/27/14 0610 06/28/14 0530  NA 134* 131*  K 4.4 3.7  CL 103 100  CO2 18* 18*  GLUCOSE 131* 97  BUN 20 15  CREATININE 1.52* 0.90  CALCIUM 6.8* 7.2*     Studies/Results:  Blood culture: E. Coli (pan-sensitive) Urine culture:  Do not see that a urine culture was sent despite reassurances from ED providers that this was done.   Assessment/Plan: 1) Sepsis secondary to pyelonephritis and obstructing ureteral stone s/p stent placement: Pt is improving.  Continue ceftriaxone awaiting final culture results.  Unfortunately, it appears that urine culture was not sent despite reassurances from ED providers. Blood cultures are growing pan-sensitive E. Coli consistent with GU  source.   D/C home today on 2 weeks of cipro  Will arrange follow up as outpatient and to set up definitive stone treatment once infection is treated.  Remove Foley today.     LOS: 3 days   [name] C 06/29/2014, 9:13 AM

## 2014-07-12 ENCOUNTER — Other Ambulatory Visit (HOSPITAL_COMMUNITY): Payer: Self-pay | Admitting: Urology

## 2014-07-12 DIAGNOSIS — N200 Calculus of kidney: Secondary | ICD-10-CM

## 2014-08-09 ENCOUNTER — Ambulatory Visit (HOSPITAL_COMMUNITY)
Admission: RE | Admit: 2014-08-09 | Discharge: 2014-08-09 | Disposition: A | Discharge status: Home / Self Care | Payer: PRIVATE HEALTH INSURANCE | Source: Ambulatory Visit | Attending: Urology | Admitting: Urology

## 2014-08-09 DIAGNOSIS — N200 Calculus of kidney: Secondary | ICD-10-CM

## 2014-08-09 DIAGNOSIS — N858 Other specified noninflammatory disorders of uterus: Secondary | ICD-10-CM | POA: Diagnosis not present

## 2014-08-09 DIAGNOSIS — N202 Calculus of kidney with calculus of ureter: Secondary | ICD-10-CM | POA: Insufficient documentation

## 2014-08-09 DIAGNOSIS — N1330 Unspecified hydronephrosis: Secondary | ICD-10-CM | POA: Insufficient documentation

## 2014-08-09 DIAGNOSIS — M549 Dorsalgia, unspecified: Secondary | ICD-10-CM | POA: Diagnosis present

## 2015-09-08 IMAGING — CT CT ABD-PELV W/O CM
1 series · 12 of 32 positions shown, 15 images · non-contrast
Comparison: 03/19/2011

CLINICAL DATA: Right-sided flank pain

EXAM:
CT ABDOMEN AND PELVIS WITHOUT CONTRAST
TECHNIQUE: Multidetector CT imaging of the abdomen and pelvis was performed
following the standard protocol without IV contrast.

[Series 6: sagittal · sagittal · 0.74mm/px · 12 of 128 slices shown, 15 images]
[im 5/128  lung]
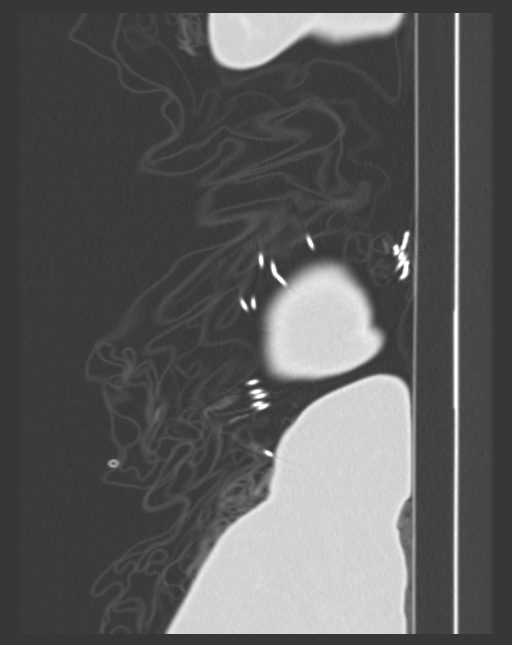
[im 9/128  soft-tissue]
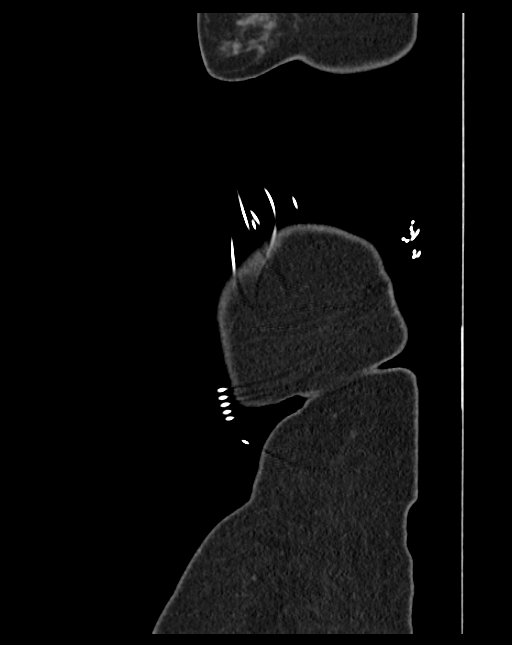
[im 9/128  lung]
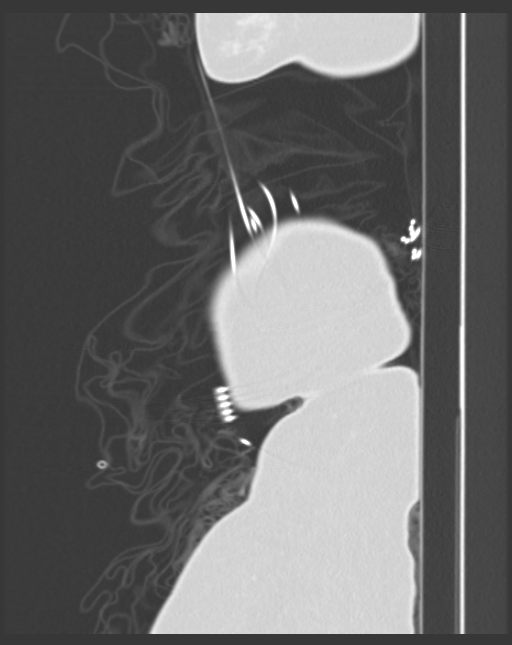
[im 9/128  bone]
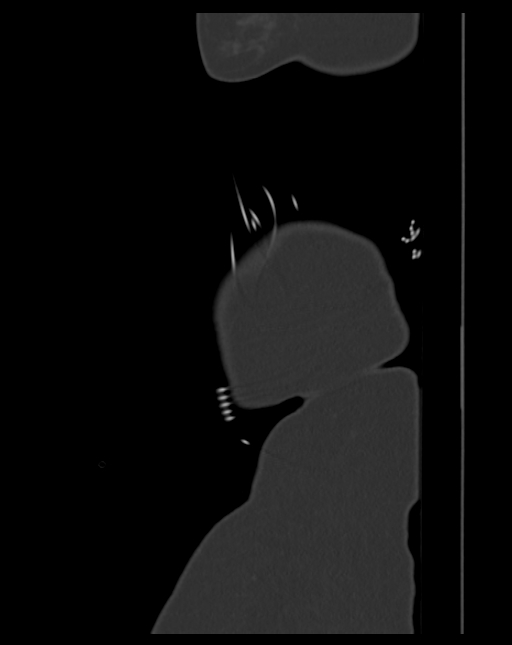
[im 13/128  lung]
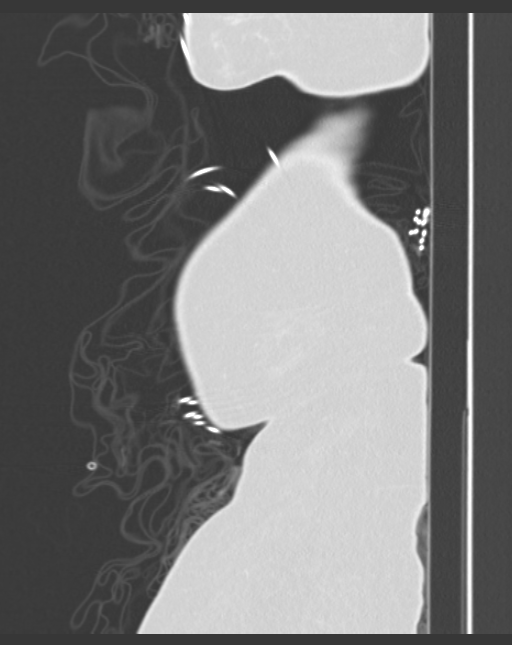
[im 17/128  lung]
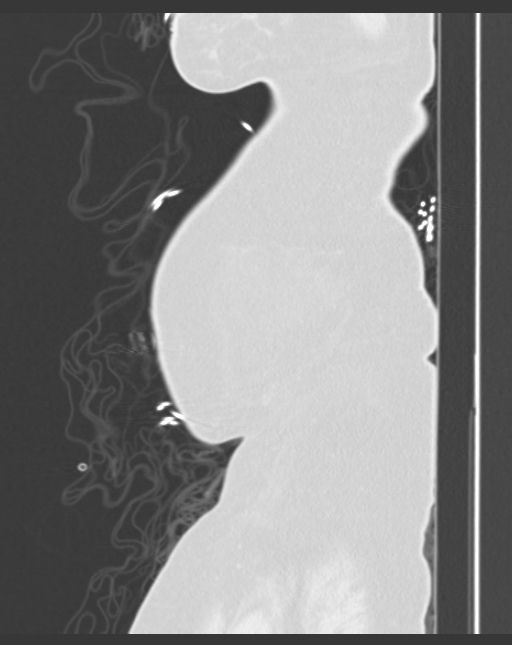
[im 25/128  soft-tissue]
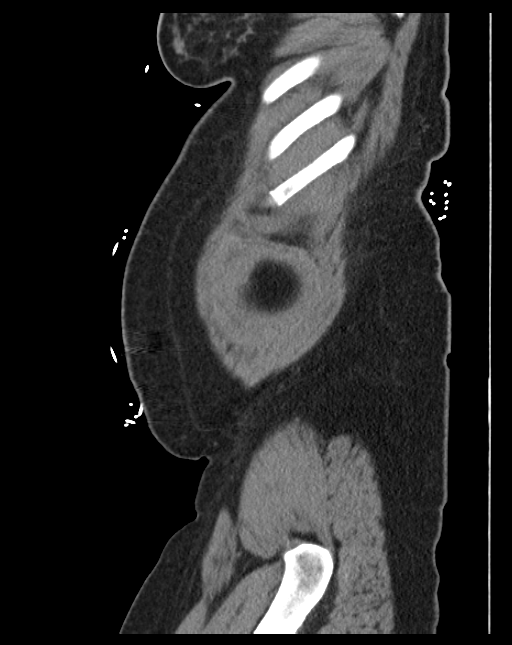
[im 37/128  soft-tissue]
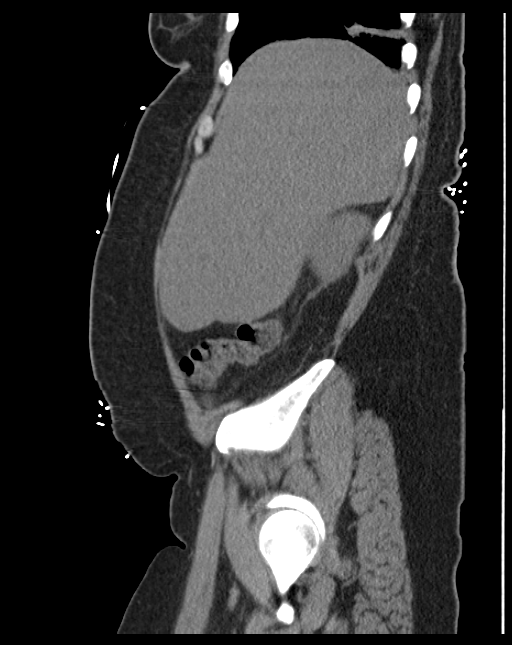
[im 50/128  soft-tissue]
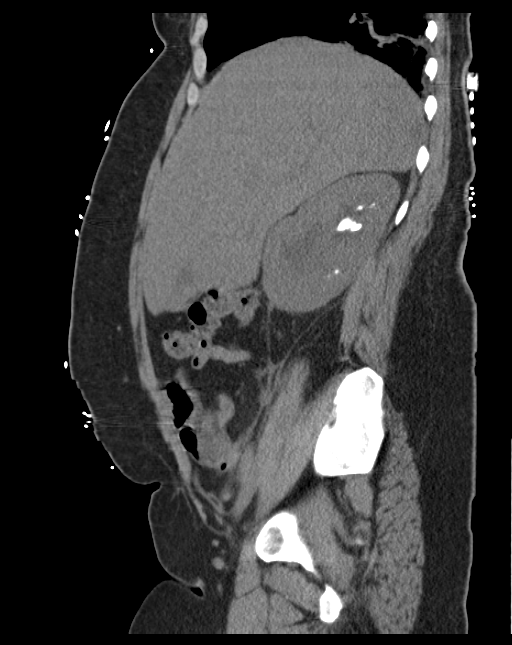
[im 66/128  soft-tissue]
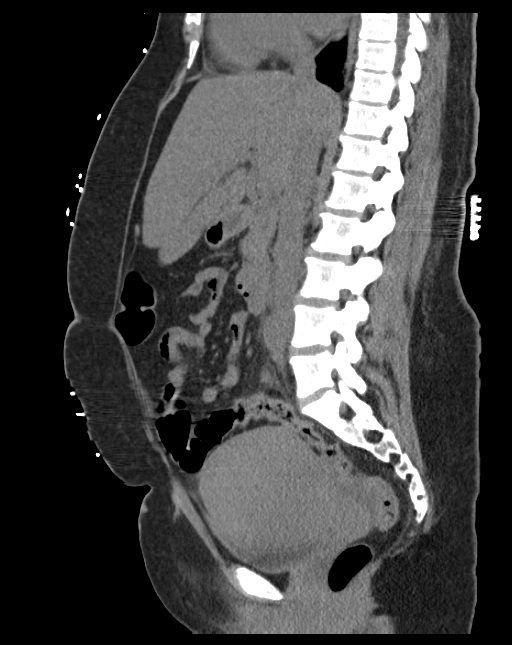
[im 78/128  soft-tissue]
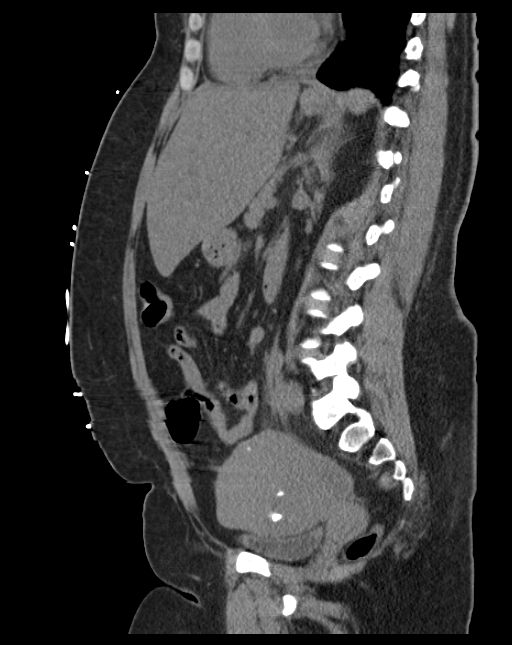
[im 91/128  soft-tissue]
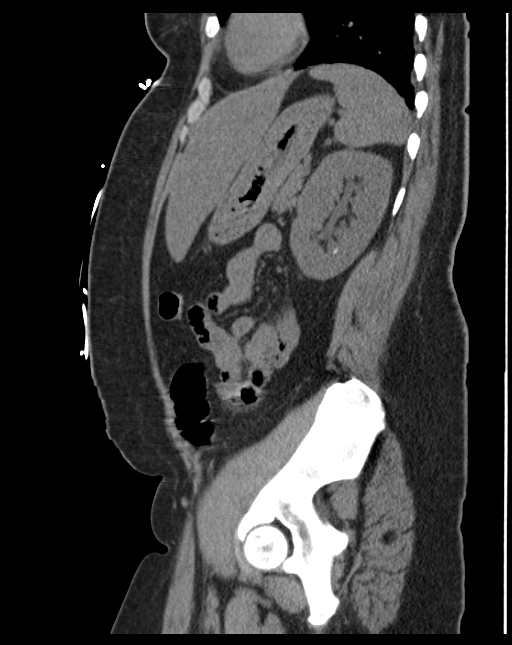
[im 107/128  soft-tissue]
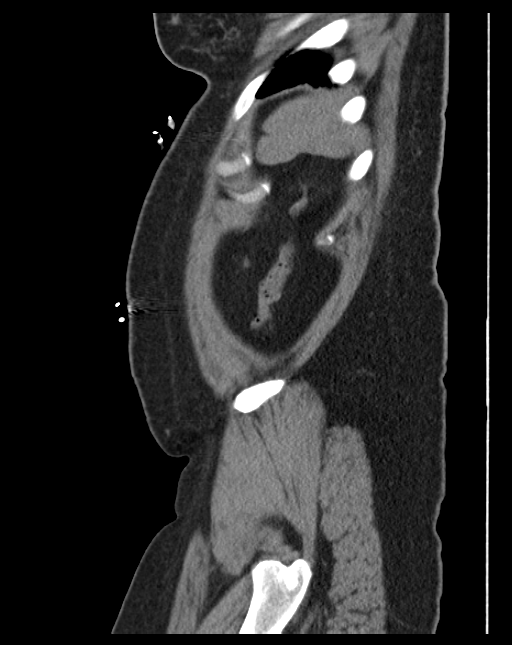
[im 119/128  soft-tissue]
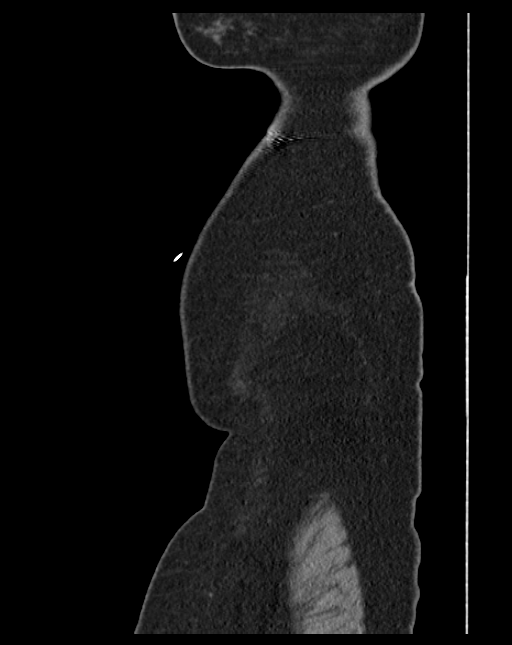
[im 119/128  bone]
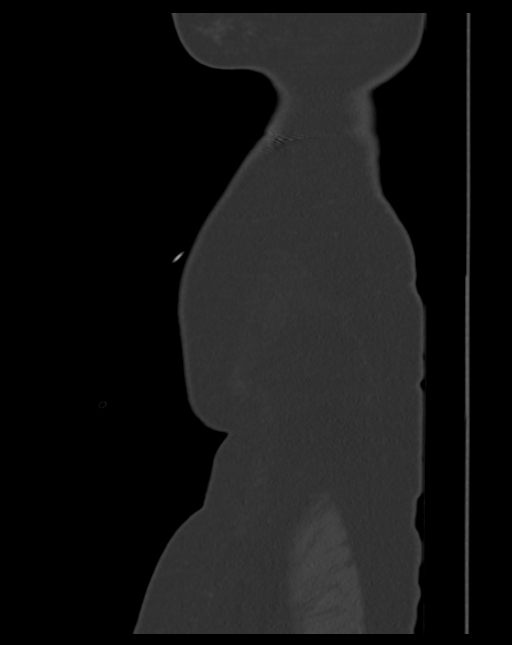

[12 of 32 positions shown; findings below may reference images not displayed]

FINDINGS: The lung bases demonstrate some right basilar atelectasis.

The liver, gallbladder, spleen, adrenal glands and pancreas are
within normal limits. The left kidney is well visualized and shows
to a tiny nonobstructing stone in the lower pole. The right kidney
demonstrates moderate hydronephrosis and hydroureter secondary to a
6 mm stone just above the ureteropelvic junction. Additionally
multiple nonobstructing renal calculi are noted in the right kidney
particular in the upper and mid polar region suggesting early
staghorn calculus. The appendix is within normal limits.

The bladder is partially distended. Uterine calcifications
consistent with fibroid disease are noted. Minimal free fluid is
noted likely physiologic in nature.
IMPRESSION: Early right staghorn calculus.

Right hydronephrosis and hydroureter secondary to a 6 mm distal
ureteral stone.

Tiny nonobstructing left renal stone in the lower pole.

## 2016-12-20 ENCOUNTER — Ambulatory Visit (INDEPENDENT_AMBULATORY_CARE_PROVIDER_SITE_OTHER): Payer: Managed Care, Other (non HMO) | Admitting: Family Medicine

## 2016-12-20 VITALS — BP 132/84 | HR 102 | Temp 98.3°F | Resp 16 | Ht 65.0 in | Wt 206.4 lb

## 2016-12-20 DIAGNOSIS — R000 Tachycardia, unspecified: Secondary | ICD-10-CM | POA: Diagnosis not present

## 2016-12-20 DIAGNOSIS — D649 Anemia, unspecified: Secondary | ICD-10-CM

## 2016-12-20 DIAGNOSIS — N951 Menopausal and female climacteric states: Secondary | ICD-10-CM | POA: Diagnosis not present

## 2016-12-20 DIAGNOSIS — R1013 Epigastric pain: Secondary | ICD-10-CM | POA: Diagnosis not present

## 2016-12-20 DIAGNOSIS — E6609 Other obesity due to excess calories: Secondary | ICD-10-CM | POA: Diagnosis not present

## 2016-12-20 DIAGNOSIS — Z6834 Body mass index (BMI) 34.0-34.9, adult: Secondary | ICD-10-CM | POA: Diagnosis not present

## 2016-12-20 DIAGNOSIS — Z113 Encounter for screening for infections with a predominantly sexual mode of transmission: Secondary | ICD-10-CM

## 2016-12-20 MED ORDER — VARENICLINE TARTRATE 0.5 MG X 11 & 1 MG X 42 PO MISC: ORAL | 0 refills | Status: DC

## 2016-12-20 NOTE — Patient Instructions (Addendum)
IF you received an x-ray today, you will receive an invoice from [facility]. Please contact [facility] at [phone] with questions or concerns regarding your invoice.   IF you received labwork today, you will receive an invoice from [facility]. Please contact [facility] at [phone] with questions or concerns regarding your invoice.   Our billing staff will not be able to assist you with questions regarding bills from these companies.  You will be contacted with the lab results as soon as they are available. The fastest way to get your results is to activate your My Chart account. Instructions are located on the last page of this paperwork. If you have not heard from [place] regarding the results in 2 weeks, please contact this office.      Sinus Tachycardia Sinus tachycardia is a kind of fast heartbeat. In sinus tachycardia, the heart beats more than 100 times a minute. Sinus tachycardia starts in a part of the heart called the sinus node. Sinus tachycardia may be harmless, or it may be a sign of a serious condition. What are the causes? This condition may be caused by:  Exercise or exertion.  A fever.  Pain.  Loss of body fluids (dehydration).  Severe bleeding (hemorrhage).  Anxiety and stress.  Certain substances, including:  Alcohol.  Caffeine.  Tobacco and nicotine products.  Diet pills.  Illegal drugs.  Medical conditions including:  Heart disease.  An infection.  An overactive thyroid (hyperthyroidism).  A lack of red blood cells (anemia). What are the signs or symptoms? Symptoms of this condition include:  A feeling that the heart is beating quickly (palpitations).  Suddenly noticing your heartbeat (cardiac awareness).  Dizziness.  Tiredness (fatigue).  Shortness of breath.  Chest pain.  Nausea.  Fainting. How is this diagnosed? This condition is diagnosed with:  A physical exam.  Other tests, such as:  Blood  tests.  An electrocardiogram (ECG). This test measures the electrical activity of the heart.  Holter monitoring. For this test, you wear a device that records your heartbeat for one or more days. You may be referred to a heart specialist (cardiologist). How is this treated? Treatment for this condition depends on the cause or underlying condition. Treatment may involve:  Treating the underlying condition.  Taking new medicines or changing your current medicines as told by your health care provider.  Making changes to your diet or lifestyle.  Practicing relaxation methods. Follow these instructions at home: Lifestyle   Do not use any products that contain nicotine or tobacco, such as cigarettes and e-cigarettes. If you need help quitting, ask your health care provider.  Learn relaxation methods, like deep breathing, to help you when you get stressed or anxious.  Do not use illegal drugs, such as cocaine.  Do not abuse alcohol. Limit alcohol intake to no more than 1 drink a day for non-pregnant women and 2 drinks a day for men. One drink equals 12 oz of beer, 5 oz of wine, or 1 oz of hard liquor.  Find time to rest and relax often. This reduces stress.  Avoid:  Caffeine.  Stimulants such as over-the-counter diet pills or pills that help you to stay awake.  Situations that cause anxiety or stress. General instructions   Drink enough fluids to keep your urine clear or pale yellow.  Take over-the-counter and prescription medicines only as told by your health care provider.  Keep all follow-up visits as told by your health care provider. This is  important. Contact a health care provider if:  You have a fever.  You have vomiting or diarrhea that keeps happening (is persistent). Get help right away if:  You have pain in your chest, upper arms, jaw, or neck.  You become weak or dizzy.  You feel faint.  You have palpitations that do not go away. This information is not  intended to replace advice given to you by your health care provider. Make sure you discuss any questions you have with your health care provider. Document Released: 10/21/2004 Document Revised: 04/10/2016 Document Reviewed: 03/28/2015 [employer]vier Interactive Patient Education  2017 [employer]

## 2016-12-20 NOTE — Progress Notes (Signed)
Chief Complaint  Patient presents with  . Tachycardia  . Insomnia    HPI  Insomnia and tachycardia Pt reports that she has not been sleeping restfully a week ago  She reports that this past week she felt like she had a fullness like she needed to burp or had gas with a rapid heart beat She did not check the rate at home but it felt high These symptoms were worsened over the past two days  She reports that she took tums and symptoms improved Her symptoms are worse after eating She reports that she has a history anemia which was treated with iron until she developed constipation  Perimenopause She reports that she has hot flashes  She was prescribed premarin by Gynecology but she did not take it since she is still smoking.  Obesity Pt reports that she has been gaining weight She reports that she like her weight is increasing Especially her abdominal waist size Body mass index is 34.35 kg/m.   Past Medical History:  Diagnosis Date  . Kidney stones     Current Outpatient Prescriptions  Medication Sig Dispense Refill  . varenicline (CHANTIX STARTING MONTH PAK) 0.5 MG X 11 & 1 MG X 42 tablet Take one 0.5 mg tab once daily for 3 days, then increase to one 0.5 mg tab twice daily for 4 days, then increase to one 1 mg tab twice daily. 53 tablet 0   No current facility-administered medications for this visit.     Allergies: No Known Allergies  Past Surgical History:  Procedure Laterality Date  . CYSTOSCOPY WITH RETROGRADE PYELOGRAM, URETEROSCOPY AND STENT PLACEMENT Right 06/26/2014   Procedure: CYSTOSCOPY WITH STENT PLACEMENT;  Surgeon: Heloise PurpuraLester Borden, MD;  Location: [facility];  Service: Urology;  Laterality: Right;  . KIDNEY STONE SURGERY Left 2013/2015  . LITHOTRIPSY      Social History   Social History  . Marital status: Single    Spouse name: N/A  . Number of children: N/A  . Years of education: N/A   Social History Main Topics  . Smoking status: Current Every Day  Smoker    Packs/day: 0.50    Years: 36.00    Types: Cigarettes  . Smokeless tobacco: Never Used  . Alcohol use Yes  . Drug use: No  . Sexual activity: Not Asked   Other Topics Concern  . None   Social History Narrative  . None    ROS See  hpi  Objective: Vitals:   12/20/16 1226  BP: 132/84  Pulse: (!) 102  Resp: 16  Temp: 98.3 F (36.8 C)  TempSrc: Oral  SpO2: 96%  Weight: 206 lb 6.4 oz (93.6 kg)  Height: 5\' 5"  (1.651 m)    Physical Exam  Constitutional: She is oriented to person, place, and time. She appears well-developed and well-nourished.  HENT:  Head: Normocephalic and atraumatic.  Right Ear: External ear normal.  Left Ear: External ear normal.  Mouth/Throat: Oropharynx is clear and moist.  Eyes: Conjunctivae and EOM are normal.  Cardiovascular: Normal rate, regular rhythm, normal heart sounds and intact distal pulses.   No murmur heard. Pulmonary/Chest: Effort normal and breath sounds normal. No respiratory distress. She has no wheezes. She has no rales. She exhibits no tenderness.  Abdominal: Soft. Bowel sounds are normal. She exhibits no distension and no mass. There is no tenderness. There is no guarding.  Obese abdomen  Musculoskeletal: Normal range of motion. She exhibits no edema.  Neurological: She is alert and  oriented to person, place, and time. No cranial nerve deficit.  Skin: Skin is warm. Capillary refill takes less than 2 seconds. No erythema.  Psychiatric: She has a normal mood and affect. Her behavior is normal. Judgment and thought content normal.     ECG HR 87,  nsr, no TWI or ST elevation   Assessment and Plan Deanna Goodwin was seen today for tachycardia and insomnia.  Diagnoses and all orders for this visit:  Screen for STD (sexually transmitted disease)- will screen today -     GC/Chlamydia Probe Amp -     HIV antibody (with reflex)  Anemia, unspecified type- will reassess as this can cause tachycardia -     CBC with  Differential/Platelet -     TSH  Perimenopause- discussed that will check for other causes of hot flashes -     TSH -     Comprehensive metabolic panel  Tachycardia- normal HR in clinic -     CBC with Differential/Platelet -     TSH -     Comprehensive metabolic panel -     EKG 12-Lead  Class 1 obesity due to excess calories without serious comorbidity with body mass index (BMI) of 34.0 to 34.9 in adult -   Discussed that she should try weight watchers or myfitnesspal -     Lipid panel -     Comprehensive metabolic panel  Dyspepsia- discussed weight loss to improve GERD symptoms Limiting late evening meals -     H. pylori breath test  Other orders -     varenicline (CHANTIX STARTING MONTH PAK) 0.5 MG X 11 & 1 MG X 42 tablet; Take one 0.5 mg tab once daily for 3 days, then increase to one 0.5 mg tab twice daily for 4 days, then increase to one 1 mg tab twice daily.   A total of 48 minutes were spent face-to-face with the patient during this encounter and over half of that time was spent on counseling and coordination of care.   Kinzleigh Goodwin A Jedd Schulenburg

## 2016-12-21 LAB — CBC WITH DIFFERENTIAL/PLATELET
BASOS: 0 %
Basophils Absolute: 0 10*3/uL (ref 0.0–0.2)
EOS (ABSOLUTE): 0.2 10*3/uL (ref 0.0–0.4)
EOS: 2 %
Hematocrit: 45.3 % (ref 34.0–46.6)
Hemoglobin: 15 g/dL (ref 11.1–15.9)
Immature Grans (Abs): 0 10*3/uL (ref 0.0–0.1)
Immature Granulocytes: 0 %
LYMPHS: 33 %
Lymphocytes Absolute: 2.3 10*3/uL (ref 0.7–3.1)
MCH: 29.6 pg (ref 26.6–33.0)
MCHC: 33.1 g/dL (ref 31.5–35.7)
MCV: 90 fL (ref 79–97)
Monocytes Absolute: 0.5 10*3/uL (ref 0.1–0.9)
Monocytes: 7 %
NEUTROS ABS: 4 10*3/uL (ref 1.4–7.0)
Neutrophils: 58 %
Platelets: 305 10*3/uL (ref 150–379)
RBC: 5.06 x10E6/uL (ref 3.77–5.28)
RDW: 16 % — AB (ref 12.3–15.4)
WBC: 7 10*3/uL (ref 3.4–10.8)

## 2016-12-21 LAB — COMPREHENSIVE METABOLIC PANEL
A/G RATIO: 1.4 (ref 1.2–2.2)
ALT: 27 IU/L (ref 0–32)
AST: 25 IU/L (ref 0–40)
Albumin: 4.7 g/dL (ref 3.5–5.5)
Alkaline Phosphatase: 101 IU/L (ref 39–117)
BILIRUBIN TOTAL: 0.3 mg/dL (ref 0.0–1.2)
BUN/Creatinine Ratio: 14 (ref 9–23)
BUN: 10 mg/dL (ref 6–24)
CHLORIDE: 96 mmol/L (ref 96–106)
CO2: 25 mmol/L (ref 18–29)
Calcium: 10.1 mg/dL (ref 8.7–10.2)
Creatinine, Ser: 0.71 mg/dL (ref 0.57–1.00)
GFR calc Af Amer: 112 mL/min/{1.73_m2} (ref >59)
GFR calc non Af Amer: 98 mL/min/{1.73_m2} (ref >59)
Globulin, Total: 3.3 g/dL (ref 1.5–4.5)
Glucose: 110 mg/dL — ABNORMAL HIGH (ref 65–99)
POTASSIUM: 4.6 mmol/L (ref 3.5–5.2)
SODIUM: 140 mmol/L (ref 134–144)
Total Protein: 8 g/dL (ref 6.0–8.5)

## 2016-12-21 LAB — LIPID PANEL
CHOL/HDL RATIO: 3.5 ratio (ref 0.0–4.4)
Cholesterol, Total: 194 mg/dL (ref 100–199)
HDL: 55 mg/dL (ref >39)
LDL Calculated: 87 mg/dL (ref 0–99)
Triglycerides: 258 mg/dL — ABNORMAL HIGH (ref 0–149)
VLDL Cholesterol Cal: 52 mg/dL — ABNORMAL HIGH (ref 5–40)

## 2016-12-21 LAB — HIV ANTIBODY (ROUTINE TESTING W REFLEX): HIV Screen 4th Generation wRfx: NONREACTIVE

## 2016-12-21 LAB — GC/CHLAMYDIA PROBE AMP
Chlamydia trachomatis, NAA: NEGATIVE
Neisseria gonorrhoeae by PCR: NEGATIVE

## 2016-12-21 LAB — H. PYLORI BREATH TEST: H. pylori UBiT: NEGATIVE

## 2016-12-21 LAB — TSH: TSH: 1.43 u[IU]/mL (ref 0.450–4.500)

## 2017-01-07 ENCOUNTER — Encounter: Payer: Self-pay | Admitting: Family Medicine

## 2017-02-11 ENCOUNTER — Encounter: Payer: Self-pay | Admitting: Family Medicine

## 2017-10-16 ENCOUNTER — Emergency Department (HOSPITAL_COMMUNITY)
Admission: EM | Admit: 2017-10-16 | Discharge: 2017-10-16 | Disposition: A | Discharge status: Home / Self Care | Payer: Managed Care, Other (non HMO) | Attending: Emergency Medicine | Admitting: Emergency Medicine

## 2017-10-16 ENCOUNTER — Emergency Department (HOSPITAL_COMMUNITY): Payer: Managed Care, Other (non HMO)

## 2017-10-16 ENCOUNTER — Encounter (HOSPITAL_COMMUNITY): Payer: Self-pay | Admitting: Emergency Medicine

## 2017-10-16 DIAGNOSIS — M545 Low back pain, unspecified: Secondary | ICD-10-CM

## 2017-10-16 DIAGNOSIS — N3000 Acute cystitis without hematuria: Secondary | ICD-10-CM | POA: Diagnosis not present

## 2017-10-16 DIAGNOSIS — N3090 Cystitis, unspecified without hematuria: Secondary | ICD-10-CM

## 2017-10-16 DIAGNOSIS — Z87442 Personal history of urinary calculi: Secondary | ICD-10-CM | POA: Insufficient documentation

## 2017-10-16 DIAGNOSIS — F17210 Nicotine dependence, cigarettes, uncomplicated: Secondary | ICD-10-CM | POA: Diagnosis not present

## 2017-10-16 DIAGNOSIS — R1031 Right lower quadrant pain: Secondary | ICD-10-CM | POA: Diagnosis present

## 2017-10-16 LAB — CBC WITH DIFFERENTIAL/PLATELET
Basophils Absolute: 0 10*3/uL (ref 0.0–0.1)
Basophils Relative: 0 %
EOS ABS: 0.2 10*3/uL (ref 0.0–0.7)
EOS PCT: 2 %
HCT: 39.7 % (ref 36.0–46.0)
Hemoglobin: 13.2 g/dL (ref 12.0–15.0)
LYMPHS ABS: 2.9 10*3/uL (ref 0.7–4.0)
Lymphocytes Relative: 42 %
MCH: 30.5 pg (ref 26.0–34.0)
MCHC: 33.2 g/dL (ref 30.0–36.0)
MCV: 91.7 fL (ref 78.0–100.0)
Monocytes Absolute: 0.5 10*3/uL (ref 0.1–1.0)
Monocytes Relative: 7 %
Neutro Abs: 3.4 10*3/uL (ref 1.7–7.7)
Neutrophils Relative %: 49 %
PLATELETS: 246 10*3/uL (ref 150–400)
RBC: 4.33 MIL/uL (ref 3.87–5.11)
RDW: 15.5 % (ref 11.5–15.5)
WBC: 6.9 10*3/uL (ref 4.0–10.5)

## 2017-10-16 LAB — COMPREHENSIVE METABOLIC PANEL
ALBUMIN: 4.2 g/dL (ref 3.5–5.0)
ALT: 21 U/L (ref 14–54)
AST: 23 U/L (ref 15–41)
Alkaline Phosphatase: 80 U/L (ref 38–126)
Anion gap: 10 (ref 5–15)
BUN: 10 mg/dL (ref 6–20)
CO2: 26 mmol/L (ref 22–32)
CREATININE: 0.67 mg/dL (ref 0.44–1.00)
Calcium: 9.4 mg/dL (ref 8.9–10.3)
Chloride: 103 mmol/L (ref 101–111)
GFR calc Af Amer: >60 mL/min (ref >60)
Glucose, Bld: 107 mg/dL — ABNORMAL HIGH (ref 65–99)
Potassium: 4 mmol/L (ref 3.5–5.1)
Sodium: 139 mmol/L (ref 135–145)
TOTAL PROTEIN: 7.9 g/dL (ref 6.5–8.1)
Total Bilirubin: 0.5 mg/dL (ref 0.3–1.2)

## 2017-10-16 LAB — URINALYSIS, ROUTINE W REFLEX MICROSCOPIC
Bilirubin Urine: NEGATIVE
GLUCOSE, UA: NEGATIVE mg/dL
Hgb urine dipstick: NEGATIVE
Ketones, ur: NEGATIVE mg/dL
Nitrite: NEGATIVE
Protein, ur: NEGATIVE mg/dL
SPECIFIC GRAVITY, URINE: 1.027 (ref 1.005–1.030)
pH: 5 (ref 5.0–8.0)

## 2017-10-16 MED ORDER — DEXTROSE 5 % IV SOLN
1.0000 g | Freq: Once | INTRAVENOUS | Status: AC
  Administered 2017-10-16: 1 g via INTRAVENOUS
  Filled 2017-10-16: qty 10

## 2017-10-16 MED ORDER — SODIUM CHLORIDE 0.9 % IV BOLUS (SEPSIS)
1000.0000 mL | Freq: Once | INTRAVENOUS | Status: AC
  Administered 2017-10-16: 1000 mL via INTRAVENOUS

## 2017-10-16 MED ORDER — KETOROLAC TROMETHAMINE 30 MG/ML IJ SOLN
15.0000 mg | Freq: Once | INTRAMUSCULAR | Status: AC
  Administered 2017-10-16: 15 mg via INTRAMUSCULAR
  Filled 2017-10-16: qty 1

## 2017-10-16 MED ORDER — METHOCARBAMOL 500 MG PO TABS: 500.0000 mg | ORAL_TABLET | Freq: Two times a day (BID) | ORAL | 0 refills | Status: DC

## 2017-10-16 MED ORDER — NAPROXEN 500 MG PO TABS: 500.0000 mg | ORAL_TABLET | Freq: Two times a day (BID) | ORAL | 0 refills | Status: DC

## 2017-10-16 MED ORDER — PHENAZOPYRIDINE HCL 200 MG PO TABS: 200.0000 mg | ORAL_TABLET | Freq: Three times a day (TID) | ORAL | 0 refills | Status: DC

## 2017-10-16 MED ORDER — CEPHALEXIN 500 MG PO CAPS: 500.0000 mg | ORAL_CAPSULE | Freq: Two times a day (BID) | ORAL | 0 refills | Status: DC

## 2017-10-16 MED ORDER — METHOCARBAMOL 500 MG PO TABS
750.0000 mg | ORAL_TABLET | Freq: Once | ORAL | Status: AC
  Administered 2017-10-16: 750 mg via ORAL
  Filled 2017-10-16: qty 2

## 2017-10-16 MED ORDER — ONDANSETRON HCL 4 MG/2ML IJ SOLN
4.0000 mg | Freq: Once | INTRAMUSCULAR | Status: AC
Start: 2017-10-16 — End: 2017-10-16
  Administered 2017-10-16: 4 mg via INTRAVENOUS
  Filled 2017-10-16: qty 2

## 2017-10-16 NOTE — Discharge Instructions (Signed)
You were seen here today for right flank pain and decreased urine output. You were found to have low back pain and at UTI. Follow attached handout for UTI information. Please take all of your antibiotics until finished!   You may develop abdominal discomfort or diarrhea from the antibiotic.  You may help offset this with probiotics which you can buy or get in yogurt. Do not eat or take the probiotics until 2 hours after your antibiotic. Do not take your medicine if develop an itchy rash, swelling in your mouth or lips, or difficulty breathing. I am prescribing you pyridium for bladder spasms if they are to occur. Note that Pyridium can turn your urine orange.  Back Pain: Low back pain is discomfort in the lower back that may be due to injuries to muscles and ligaments around the spine. Occasionally, it may be caused by a problem to a part of the spine called a disc. Your back pain should be treated with medicines listed below as well as back exercises and this back pain should get better over the next 2 weeks. Most patients get completely well in 4 weeks. It is important to know however, if you develop severe or worsening pain, low back pain with fever, numbness, weakness or inability to walk or urinate, you should return to the ER immediately.  Please follow up with your doctor this week for a recheck if still having symptoms.  HOME INSTRUCTIONS Self - care:  The application of heat can help soothe the pain.  Maintaining your daily activities, including walking (this is encouraged), as it will help you get better faster than just staying in bed. Do not life, push, pull anything more than 10 pounds for the next week. I am attaching back exercises that you can do at home to help facilitate your recovery.   Back Exercises - I have attached a handout on back exercises that can be done at home to help facilitate your recovery.   Medications are also useful to help with pain control.   Acetaminophen.  This  medication is generally safe, and found over the counter. Take as directed for your age. You should not take more than 8 of the extra strength (500mg ) pills a day (max dose is 4000mg  total OVER one day)  Non steroidal anti inflammatory: This includes medications including Ibuprofen, naproxen and Mobic; These medications help both pain and swelling and are very useful in treating back pain.  They should be taken with food, as they can cause stomach upset, and more seriously, stomach bleeding. Do not combine the medications.   Muscle relaxants:  These medications can help with muscle tightness that is a cause of lower back pain.  Most of these medications can cause drowsiness, and it is not safe to drive or use dangerous machinery while taking them. They are primarily helpful when taken at night before sleep.  You will need to follow up with your primary healthcare provider or the Orthopedist in 1-2 weeks for reassessment and persistent symptoms.  Be aware that if you develop new symptoms, such as a fever, leg weakness, difficulty with or loss of control of your urine or bowels, abdominal pain, or more severe pain, you will need to seek medical attention and/or return to the Emergency department. Additional Information:  Your vital signs today were: BP (!) 104/57    Pulse 67    Temp 98.4 F (36.9 C) (Oral)    Resp 16    SpO2 98%  If your blood pressure (BP) was elevated above 135/85 this visit, please have this repeated by your doctor within one month. ---------------

## 2017-10-16 NOTE — ED Notes (Signed)
Pt ambulatory and independent at discharge.  Verbalized understanding of discharge instructions 

## 2017-10-16 NOTE — ED Triage Notes (Signed)
Patient c/o right side flank pain since yesterday. Hx kidney stones.

## 2017-10-16 NOTE — ED Provider Notes (Signed)
[facility]-EMERGENCY DEPT Provider Note   CSN: [id number] Arrival date & time: 10/16/17  1348     History   Chief Complaint Chief Complaint  Patient presents with  . Flank Pain    HPI Deanna Goodwin is a 57 y.o. female with a history of kidney stones who presents emerged department today for reported right flank pain.  Patient states that yesterday morning she was doing laundry and bent down to pick up the laundry basket when she felt a pulling sensation in her right lower back that was sharp in nature.  Since then she has been having an achy, sharp pain in her right lower back with some radiation into her right buttocks and right hip.  She reports that the symptoms are worse with ambulation, movement and palpation.  She has not tried anything for this.  She is concerned this may be due to a kidney stone due to her past history.  The patient denies any N/V/D, constipation or associated urinary symptoms including dysuria, flank pain, abdominal pain, urinary frequency, urgency, or hematuria. There is some decreased urine output however. LMP was in August. She denies red flags for back pain including history of cancer, trauma, fever, night pain, IV drug use, recent spinal manipulation or procedures, upper back pain or neck pain, numbness/tingling/weakness of the lower extremities, urinary retention, loss of bowel/bladder function, saddle anesthesia, or unexplained weight loss.   HPI  Past Medical History:  Diagnosis Date  . Kidney stones     Patient Active Problem List   Diagnosis Date Noted  . Sepsis due to urinary tract infection (HCC) 06/26/2014  . Septic shock(785.52) 06/26/2014  . Obstructive nephropathy 06/26/2014    Past Surgical History:  Procedure Laterality Date  . CYSTOSCOPY WITH RETROGRADE PYELOGRAM, URETEROSCOPY AND STENT PLACEMENT Right 06/26/2014   Procedure: CYSTOSCOPY WITH STENT PLACEMENT;  Surgeon: Heloise Purpura, MD;  Location: [facility];   Service: Urology;  Laterality: Right;  . KIDNEY STONE SURGERY Left 2013/2015  . LITHOTRIPSY      OB History    No data available       Home Medications    Prior to Admission medications   Medication Sig Start Date End Date Taking? Authorizing Provider  varenicline (CHANTIX STARTING MONTH PAK) 0.5 MG X 11 & 1 MG X 42 tablet Take one 0.5 mg tab once daily for 3 days, then increase to one 0.5 mg tab twice daily for 4 days, then increase to one 1 mg tab twice daily. 12/20/16   Doristine Bosworth, MD    Family History No family history on file.  Social History Social History   Tobacco Use  . Smoking status: Current Every Day Smoker    Packs/day: 0.50    Years: 36.00    Pack years: 18.00    Types: Cigarettes  . Smokeless tobacco: Never Used  Substance Use Topics  . Alcohol use: Yes  . Drug use: No     Allergies   Patient has no known allergies.   Review of Systems Review of Systems  All other systems reviewed and are negative.    Physical Exam Updated Vital Signs BP (!) 159/84 (BP Location: Left Arm)   Pulse 88   Temp 98.4 F (36.9 C) (Oral)   Resp 16   SpO2 94%   Physical Exam  Constitutional: She appears well-developed and well-nourished. No distress.  Non-toxic appearing  HENT:  Head: Normocephalic and atraumatic.  Right Ear: External ear normal.  Left  Ear: External ear normal.  Nose: Nose normal.  Mouth/Throat: Uvula is midline, oropharynx is clear and moist and mucous membranes are normal. No tonsillar exudate.  Eyes: Pupils are equal, round, and reactive to light. Right eye exhibits no discharge. Left eye exhibits no discharge. No scleral icterus.  Neck: Trachea normal and normal range of motion. Neck supple. No spinous process tenderness present. No neck rigidity. Normal range of motion present.  Cardiovascular: Normal rate, regular rhythm, normal heart sounds and intact distal pulses.  No murmur heard. Pulses:      Radial pulses are 2+ on the right  side, and 2+ on the left side.       Femoral pulses are 2+ on the right side, and 2+ on the left side.      Dorsalis pedis pulses are 2+ on the right side, and 2+ on the left side.       Posterior tibial pulses are 2+ on the right side, and 2+ on the left side.  No lower extremity swelling or edema. Calves symmetric in size bilaterally.  Pulmonary/Chest: Effort normal and breath sounds normal. No respiratory distress. She exhibits no tenderness.  Abdominal: Soft. Bowel sounds are normal. She exhibits no distension and no pulsatile midline mass. There is no tenderness. There is no rigidity, no rebound, no guarding and no CVA tenderness.  Musculoskeletal: She exhibits no edema.       Right hip: Normal.  Posterior and appearance appears normal. No evidence of obvious scoliosis or kyphosis. No obvious signs of skin changes, trauma, deformity, infection. No C, T, or L spine tenderness or step-offs to palpation. No C, T, paraspinal tenderness. There is right paraspinal lumbar ttp. Lung expansion normal. Bilateral lower extremity strength 5 out of 5. Patellar and Achilles deep tendon reflex 2+ and equal bilaterally. Sensation of lower extremities grossly intact. Straight leg right neg. Straight leg left neg. Gait able but patient notes painful. Lower extremity compartments soft. PT and DP 2+ b/l. Cap refill <2 seconds. No foot drop.   Lymphadenopathy:    She has no cervical adenopathy.  Neurological: She is alert. She has normal strength. No sensory deficit. Gait normal.  Skin: Skin is warm, dry and intact. Capillary refill takes less than 2 seconds. No rash noted. She is not diaphoretic. No erythema.  Psychiatric: She has a normal mood and affect.  Nursing note and vitals reviewed.    ED Treatments / Results  Labs (all labs ordered are listed, but only abnormal results are displayed) Labs Reviewed  URINALYSIS, ROUTINE W REFLEX MICROSCOPIC - Abnormal; Notable for the following components:       Result Value   Leukocytes, UA SMALL (*)    Bacteria, UA RARE (*)    Squamous Epithelial / LPF 0-5 (*)    All other components within normal limits  COMPREHENSIVE METABOLIC PANEL - Abnormal; Notable for the following components:   Glucose, Bld 107 (*)    All other components within normal limits  CBC WITH DIFFERENTIAL/PLATELET    EKG  EKG Interpretation None       Radiology [facility]  Result Date: 10/16/2017 CLINICAL DATA:  Right flank pain. EXAM: RENAL / URINARY TRACT ULTRASOUND COMPLETE COMPARISON:  CT scan August 09, 2014 FINDINGS: Right Kidney: Length: 11 cm. Nonobstructive stones are seen in the right kidney with the largest measuring 24 mm. No hydronephrosis. Left Kidney: Length: 11 cm. A 4.3 mm nonobstructive stone is seen in the left kidney. No hydronephrosis. Bladder: Appears normal for  degree of bladder distention. IMPRESSION: Bilateral nonobstructive renal stones. No cause for right-sided pain identified. Electronically Signed   By: Gerome Sam III M.D   On: 10/16/2017 19:22    Procedures Procedures (including critical care time)  Medications Ordered in ED Medications  ketorolac (TORADOL) 30 MG/ML injection 15 mg (15 mg Intramuscular Given 10/16/17 1755)  methocarbamol (ROBAXIN) tablet 750 mg (750 mg Oral Given 10/16/17 1755)  sodium chloride 0.9 % bolus 1,000 mL (1,000 mLs Intravenous New Bag/Given 10/16/17 1823)  ondansetron (ZOFRAN) injection 4 mg (4 mg Intravenous Given 10/16/17 1824)  cefTRIAXone (ROCEPHIN) 1 g in dextrose 5 % 50 mL IVPB (0 g Intravenous Stopped 10/16/17 1913)     Initial Impression / Assessment and Plan / ED Course  I have reviewed the triage vital signs and the nursing notes.  Pertinent labs & imaging results that were available during my care of the patient were reviewed by me and considered in my medical decision making (see chart for details).     Is a 57 year old female presenting with right lower back pain and decreased urine output.   Pain onset yesterday when the patient was bending down to pick up a basket of laundry.  The pain is worse with ambulation and palpation.  She has been without fever or other urinary symptoms.  Exam is consistent with low back pain.  There is no neurologic deficits and normal neuro exam.  Patient can walk but states it is painful.  No loss of bowel or bladder function.  No concern for cauda equina.   Patient requesting workup for rule out of kidney stone.  UA with no hemoglobin however possible to have stone without hgb in UA. UA is with evidence of UTI with TNTC WBC, and small leukocytes. Will give rocephin and obtain CBC, chemistry, and renal ultrasound to ensure this is not obstructed/infected stone & to check white count and kidney function. Will treat pain with muscle relaxer's and NSAIDs.   Renal ultrasound shows bilateral nonobstructive renal stones.  Do not suspect this is the cause for the patient's pain.  There is no leukocytosis.  Creatinine within normal limits and without evidence of acute kidney injury.  After treatment with muscle relaxers and NSAIDs patient has complete resolution of her symptoms.  Feel the patient can be treated for simple cystitis with Keflex BID and back pain with conservative measures, NSAIDs & muscle relaxers for pain, activity modifications.  She is to follow with her PCP in regards to today's visit.  Strict return precautions discussed.  Patient appears safe for discharge.  Final Clinical Impressions(s) / ED Diagnoses   Final diagnoses:  Acute right-sided low back pain without sciatica  Cystitis    ED Discharge Orders        Ordered    naproxen (NAPROSYN) 500 MG tablet  2 times daily     10/16/17 2032    methocarbamol (ROBAXIN) 500 MG tablet  2 times daily     10/16/17 2032    cephALEXin (KEFLEX) 500 MG capsule  2 times daily     10/16/17 2032    phenazopyridine (PYRIDIUM) 200 MG tablet  3 times daily     10/16/17 2032       Princella Pellegrini 10/16/17 2042    Rolland Porter, MD 10/16/17 2148

## 2019-07-20 ENCOUNTER — Other Ambulatory Visit: Payer: Self-pay

## 2019-07-20 ENCOUNTER — Ambulatory Visit
Admission: EM | Admit: 2019-07-20 | Discharge: 2019-07-20 | Disposition: A | Discharge status: Home / Self Care | Payer: Managed Care, Other (non HMO) | Attending: Physician Assistant | Admitting: Physician Assistant

## 2019-07-20 ENCOUNTER — Encounter: Payer: Self-pay | Admitting: Emergency Medicine

## 2019-07-20 DIAGNOSIS — K0889 Other specified disorders of teeth and supporting structures: Secondary | ICD-10-CM | POA: Diagnosis not present

## 2019-07-20 MED ORDER — AMOXICILLIN-POT CLAVULANATE 875-125 MG PO TABS: 1.0000 | ORAL_TABLET | Freq: Two times a day (BID) | ORAL | 0 refills | Status: DC

## 2019-07-20 MED ORDER — IBUPROFEN 800 MG PO TABS: 800.0000 mg | ORAL_TABLET | Freq: Three times a day (TID) | ORAL | 0 refills | Status: DC

## 2019-07-20 MED ORDER — CHLORHEXIDINE GLUCONATE 0.12 % MT SOLN: 15.0000 mL | Freq: Two times a day (BID) | OROMUCOSAL | 0 refills | Status: DC

## 2019-07-20 NOTE — ED Triage Notes (Signed)
Pt presents to [facility] for assessment of left dental pain x 2 days.  Pt known hx of cavities to this area.  C/o radiation to left ear.  Denies fevers at home.  States she has tried to call a couple of Dentist, but they can't see her for a few weeks, and she "needed relief now".

## 2019-07-20 NOTE — Discharge Instructions (Signed)
Start Augmentin as directed for dental infection. Ibuprofen and tylenol for pain. Follow up with dentist for further treatment and evaluation. If experiencing swelling of the throat, trouble breathing, trouble swallowing, leaning forward to breath, drooling, go to the emergency department for further evaluation.

## 2019-07-20 NOTE — ED Provider Notes (Signed)
[facility]    CSN: [id number] Arrival date & time: 07/20/19  1048      History   Chief Complaint Chief Complaint  Patient presents with  . Dental Pain    HPI Deanna Goodwin is a 58 y.o. female.   58 year old female comes in for left lower dental pain for the past 2 days.  States has known cavities, and denies new injury to the tooth.  Pain is more severe with oral intake, and is now radiating to the left ear.  Denies trouble swallowing, swelling of the throat, tripoding, drooling, trismus.  Denies fever, chills, body aches.  Denies URI symptoms such as cough, congestion, sore throat.  She has a dental appointment 08/05/2019.  Was unable to get appointment sooner, and therefore came in for evaluation.  Has been taking Tylenol without relief.     Past Medical History:  Diagnosis Date  . Kidney stones     Patient Active Problem List   Diagnosis Date Noted  . Sepsis due to urinary tract infection ([place]) 06/26/2014  . Septic shock(785.52) 06/26/2014  . Obstructive nephropathy 06/26/2014    Past Surgical History:  Procedure Laterality Date  . CYSTOSCOPY WITH RETROGRADE PYELOGRAM, URETEROSCOPY AND STENT PLACEMENT Right 06/26/2014   Procedure: CYSTOSCOPY WITH STENT PLACEMENT;  Surgeon: Raynelle Bring, MD;  Location: [facility];  Service: Urology;  Laterality: Right;  . KIDNEY STONE SURGERY Left 2013/2015  . LITHOTRIPSY      OB History   No obstetric history on file.      Home Medications    Prior to Admission medications   Medication Sig Start Date End Date Taking? Authorizing Provider  acetaminophen (TYLENOL) 325 MG tablet Take 650 mg by mouth every 6 (six) hours as needed for moderate pain.    [provider]  amoxicillin-clavulanate (AUGMENTIN) 875-125 MG tablet Take 1 tablet by mouth every 12 (twelve) hours. 07/20/19   Tasia Catchings,  V, PA-C  chlorhexidine (PERIDEX) 0.12 % solution Use as directed 15 mLs in the mouth or throat 2 (two) times daily.  07/20/19   Tasia Catchings,  V, PA-C  ibuprofen (ADVIL) 800 MG tablet Take 1 tablet (800 mg total) by mouth 3 (three) times daily. 07/20/19   Ok Edwards, PA-C    Family History History reviewed. No pertinent family history.  Social History Social History   Tobacco Use  . Smoking status: Current Every Day Smoker    Packs/day: 0.50    Years: 36.00    Pack years: 18.00    Types: Cigarettes  . Smokeless tobacco: Never Used  Substance Use Topics  . Alcohol use: Yes  . Drug use: No     Allergies   Patient has no known allergies.   Review of Systems Review of Systems  Reason unable to perform ROS: See HPI as above.     Physical Exam Triage Vital Signs ED Triage Vitals [07/20/19 1105]  Enc Vitals Group     BP (!) 153/85     Pulse Rate 86     Resp 18     Temp 98.7 F (37.1 C)     Temp Source Oral     SpO2 95 %     Weight      Height      Head Circumference      Peak Flow      Pain Score 8     Pain Loc      Pain Edu?      Excl. in [place]?  No data found.  Updated Vital Signs BP (!) 153/85 (BP Location: Left Arm)   Pulse 86   Temp 98.7 F (37.1 C) (Oral)   Resp 18   LMP 07/01/2014   SpO2 95%   Physical Exam Constitutional:      General: She is not in acute distress.    Appearance: She is well-developed. She is not ill-appearing, toxic-appearing or diaphoretic.  HENT:     Head: Normocephalic and atraumatic.     Jaw: No trismus.     Mouth/Throat:     Mouth: Mucous membranes are moist.     Pharynx: Oropharynx is clear. Uvula midline. No uvula swelling.     Tonsils: No tonsillar exudate.      Comments: No obvious gum swelling. Though tenderness to palpation along left lower and upper gum. No fluctuance felt.  Floor of mouth soft to palpation. No facial swelling.  Neck:     Musculoskeletal: Normal range of motion and neck supple.  Pulmonary:     Effort: Pulmonary effort is normal. No respiratory distress.  Skin:    General: Skin is warm and dry.  Neurological:      Mental Status: She is alert and oriented to person, place, and time.      UC Treatments / Results  Labs (all labs ordered are listed, but only abnormal results are displayed) Labs Reviewed - No data to display  EKG   Radiology No results found.  Procedures Procedures (including critical care time)  Medications Ordered in UC Medications - No data to display  Initial Impression / Assessment and Plan / UC Course  I have reviewed the triage vital signs and the nursing notes.  Pertinent labs & imaging results that were available during my care of the patient were reviewed by me and considered in my medical decision making (see chart for details).    Start antibiotics for possible dental infection. Symptomatic treatment as needed. Discussed with patient symptoms can return if dental problem is not addressed. Follow up with dentist for further evaluation and treatment of dental pain. Resources given. Return precautions given.   Final Clinical Impressions(s) / UC Diagnoses   Final diagnoses:  Pain, dental   ED Prescriptions    Medication Sig Dispense Auth. Provider   amoxicillin-clavulanate (AUGMENTIN) 875-125 MG tablet Take 1 tablet by mouth every 12 (twelve) hours. 14 tablet ,  V, PA-C   ibuprofen (ADVIL) 800 MG tablet Take 1 tablet (800 mg total) by mouth 3 (three) times daily. 21 tablet ,  V, PA-C   chlorhexidine (PERIDEX) 0.12 % solution Use as directed 15 mLs in the mouth or throat 2 (two) times daily. 240 mL Cathie Hoops,  V, PA-C     I have reviewed the PDMP during this encounter.   Belinda Fisher, PA-C 07/20/19 1124

## 2019-07-21 ENCOUNTER — Encounter (HOSPITAL_COMMUNITY): Payer: Self-pay | Admitting: Emergency Medicine

## 2019-07-21 ENCOUNTER — Other Ambulatory Visit: Payer: Self-pay

## 2019-07-21 ENCOUNTER — Emergency Department (HOSPITAL_COMMUNITY)
Admission: EM | Admit: 2019-07-21 | Discharge: 2019-07-21 | Disposition: A | Discharge status: Home / Self Care | Payer: Managed Care, Other (non HMO) | Attending: Emergency Medicine | Admitting: Emergency Medicine

## 2019-07-21 DIAGNOSIS — K0889 Other specified disorders of teeth and supporting structures: Secondary | ICD-10-CM | POA: Diagnosis present

## 2019-07-21 DIAGNOSIS — F17210 Nicotine dependence, cigarettes, uncomplicated: Secondary | ICD-10-CM | POA: Insufficient documentation

## 2019-07-21 MED ORDER — CLINDAMYCIN HCL 150 MG PO CAPS: 150.0000 mg | ORAL_CAPSULE | Freq: Three times a day (TID) | ORAL | 0 refills | Status: DC

## 2019-07-21 MED ORDER — HYDROCODONE-ACETAMINOPHEN 5-325 MG PO TABS: 1.0000 | ORAL_TABLET | ORAL | 0 refills | Status: DC | PRN

## 2019-07-21 NOTE — ED Triage Notes (Addendum)
C/o L sided upper and lower dental pain x 2-3 months.  States she was seen at [facility] for L knee pain and toothache yesterday.  Given antibiotic for toothache and states she was told to come to ED if pain got worse.

## 2019-07-21 NOTE — ED Provider Notes (Signed)
[facility] Provider Note   CSN: [id number] Arrival date & time: 07/21/19  1107     History   Chief Complaint Chief Complaint  Patient presents with  . Dental Pain    HPI Deanna Goodwin is a 58 y.o. female who presents with dental pain.  Patient states that she has been dealing with dental pain for several months.  Is gradually been worsening.  The pain is over the top and bottom teeth.  The pain is constant and radiates to her head and to her jaw.  The pain got so bad that she went to urgent care yesterday and was prescribed amoxicillin and 800mg  Ibuprofen.  She was told to follow-up with a dentist and she has an appointment in early November.  She was also told if her pain is getting worse that she should come to the emergency department. She states that she was working today and the pain became severe in nature.  She states the worst pain she is ever had in her life.  She denies fevers or inability to take her medicines.     HPI  Past Medical History:  Diagnosis Date  . Kidney stones     Patient Active Problem List   Diagnosis Date Noted  . Sepsis due to urinary tract infection ([facility]) 06/26/2014  . Septic shock(785.52) 06/26/2014  . Obstructive nephropathy 06/26/2014    Past Surgical History:  Procedure Laterality Date  . CYSTOSCOPY WITH RETROGRADE PYELOGRAM, URETEROSCOPY AND STENT PLACEMENT Right 06/26/2014   Procedure: CYSTOSCOPY WITH STENT PLACEMENT;  Surgeon: Heloise PurpuraLester Borden, MD;  Location: [facility];  Service: Urology;  Laterality: Right;  . KIDNEY STONE SURGERY Left 2013/2015  . LITHOTRIPSY       OB History   No obstetric history on file.      Home Medications    Prior to Admission medications   Medication Sig Start Date End Date Taking? Authorizing Provider  acetaminophen (TYLENOL) 325 MG tablet Take 650 mg by mouth every 6 (six) hours as needed for moderate pain.    [provider]  amoxicillin-clavulanate  (AUGMENTIN) 875-125 MG tablet Take 1 tablet by mouth every 12 (twelve) hours. 07/20/19   Cathie HoopsYu, Amy V, PA-C  chlorhexidine (PERIDEX) 0.12 % solution Use as directed 15 mLs in the mouth or throat 2 (two) times daily. 07/20/19   Cathie HoopsYu, Amy V, PA-C  clindamycin (CLEOCIN) 150 MG capsule Take 1 capsule (150 mg total) by mouth 3 (three) times daily. 07/21/19   Bethel BornGekas, Marleigh Kaylor Marie, PA-C  HYDROcodone-acetaminophen (NORCO/VICODIN) 5-325 MG tablet Take 1 tablet by mouth every 4 (four) hours as needed. 07/21/19   Bethel BornGekas, Arnisha Laffoon Marie, PA-C  ibuprofen (ADVIL) 800 MG tablet Take 1 tablet (800 mg total) by mouth 3 (three) times daily. 07/20/19   Belinda FisherYu, Amy V, PA-C    Family History No family history on file.  Social History Social History   Tobacco Use  . Smoking status: Current Every Day Smoker    Packs/day: 0.50    Years: 36.00    Pack years: 18.00    Types: Cigarettes  . Smokeless tobacco: Never Used  Substance Use Topics  . Alcohol use: Yes  . Drug use: No     Allergies   Patient has no known allergies.   Review of Systems Review of Systems  Constitutional: Negative for fever.  HENT: Positive for dental problem.      Physical Exam Updated Vital Signs BP (!) 118/94 (BP Location: Right Arm)  Pulse 97   Temp 98.4 F (36.9 C) (Oral)   Resp 18   LMP 07/01/2014   SpO2 97%   Physical Exam Vitals signs and nursing note reviewed.  Constitutional:      General: She is not in acute distress.    Appearance: Normal appearance. She is well-developed. She is not ill-appearing.  HENT:     Head: Normocephalic and atraumatic.     Mouth/Throat:     Dentition: Abnormal dentition. Dental caries present.  Eyes:     General: No scleral icterus.       Right eye: No discharge.        Left eye: No discharge.     Conjunctiva/sclera: Conjunctivae normal.     Pupils: Pupils are equal, round, and reactive to light.  Neck:     Musculoskeletal: Normal range of motion.  Cardiovascular:     Rate and  Rhythm: Normal rate.  Pulmonary:     Effort: Pulmonary effort is normal. No respiratory distress.  Abdominal:     General: There is no distension.  Skin:    General: Skin is warm and dry.  Neurological:     Mental Status: She is alert and oriented to person, place, and time.  Psychiatric:        Behavior: Behavior normal.      ED Treatments / Results  Labs (all labs ordered are listed, but only abnormal results are displayed) Labs Reviewed - No data to display  EKG None  Radiology No results found.  Procedures Dental Block  Date/Time: 07/21/2019 12:52 PM Performed by: Bethel Born, PA-C Authorized by: Bethel Born, PA-C   Consent:    Consent obtained:  Verbal   Consent given by:  Patient   Risks discussed:  Allergic reaction, infection, nerve damage and unsuccessful block   Alternatives discussed:  No treatment Indications:    Indications: dental pain   Location:    Block type:  Inferior alveolar   Laterality:  Left Procedure details (see MAR for exact dosages):    Topical anesthetic:  Benzocaine gel   Needle gauge:  27 G   Anesthetic injected:  Bupivacaine 0.5% WITH epi   Injection procedure:  Anatomic landmarks identified, negative aspiration for blood, anatomic landmarks palpated, introduced needle and incremental injection Post-procedure details:    Outcome:  Pain improved   Patient tolerance of procedure:  Tolerated well, no immediate complications   (including critical care time)    Medications Ordered in ED Medications - No data to display   Initial Impression / Assessment and Plan / ED Course  I have reviewed the triage vital signs and the nursing notes.  Pertinent labs & imaging results that were available during my care of the patient were reviewed by me and considered in my medical decision making (see chart for details).  Dental pain associated with dental caries and possible dental infection. Patient is afebrile, non toxic  appearing, and swallowing secretions well. No concerning findings on exam. No obvious abscess and doubt deep space head or neck infection.  I offered a dental block to pt which she accepted. She had good relief of her pain afterwards. She is concerned that the antibiotic isn't working. She has only been taking it for one day and reassurance was given that she need to give it more time. She would like to try a different antibiotic however. Will rx Clindamycin and small supply of pain medicine. She was encouraged to f/u with her dentist.  Final Clinical Impressions(s) / ED Diagnoses   Final diagnoses:  Pain, dental    ED Discharge Orders         Ordered    clindamycin (CLEOCIN) 150 MG capsule  3 times daily     07/21/19 1244    HYDROcodone-acetaminophen (NORCO/VICODIN) 5-325 MG tablet  Every 4 hours PRN     07/21/19 1244           Recardo Evangelist, PA-C 07/21/19 1253    Charlesetta Shanks, MD 07/21/19 1326

## 2019-07-21 NOTE — Discharge Instructions (Signed)
Please stop the Amoxicillin and start Clindamycin Continue Ibuprofen 800mg  Take Norco as needed for severe pain. Do not use this medicine and drive or drink alcohol Please follow up with a dentist Return if you are worsening (high fever, facial swelling, unable to swallow)

## 2022-01-18 ENCOUNTER — Ambulatory Visit
Admission: EM | Admit: 2022-01-18 | Discharge: 2022-01-18 | Disposition: A | Discharge status: Home / Self Care | Payer: Managed Care, Other (non HMO)

## 2023-06-09 ENCOUNTER — Encounter (HOSPITAL_COMMUNITY): Payer: Self-pay

## 2023-06-09 ENCOUNTER — Other Ambulatory Visit: Payer: Self-pay

## 2023-06-09 ENCOUNTER — Emergency Department (HOSPITAL_COMMUNITY): Payer: Managed Care, Other (non HMO)

## 2023-06-09 ENCOUNTER — Emergency Department (HOSPITAL_COMMUNITY)
Admission: EM | Admit: 2023-06-09 | Discharge: 2023-06-09 | Disposition: A | Discharge status: Home / Self Care | Payer: Managed Care, Other (non HMO) | Attending: Emergency Medicine | Admitting: Emergency Medicine

## 2023-06-09 DIAGNOSIS — K859 Acute pancreatitis without necrosis or infection, unspecified: Secondary | ICD-10-CM | POA: Diagnosis not present

## 2023-06-09 DIAGNOSIS — R109 Unspecified abdominal pain: Secondary | ICD-10-CM | POA: Diagnosis present

## 2023-06-09 HISTORY — DX: Unspecified convulsions: R56.9

## 2023-06-09 HISTORY — DX: Essential (primary) hypertension: I10

## 2023-06-09 LAB — COMPREHENSIVE METABOLIC PANEL
ALT: 45 U/L — ABNORMAL HIGH (ref 0–44)
AST: 97 U/L — ABNORMAL HIGH (ref 15–41)
Albumin: 3.4 g/dL — ABNORMAL LOW (ref 3.5–5.0)
Alkaline Phosphatase: 62 U/L (ref 38–126)
Anion gap: 7 (ref 5–15)
BUN: 8 mg/dL (ref 6–20)
CO2: 29 mmol/L (ref 22–32)
Calcium: 8.7 mg/dL — ABNORMAL LOW (ref 8.9–10.3)
Chloride: 98 mmol/L (ref 98–111)
Creatinine, Ser: 0.71 mg/dL (ref 0.44–1.00)
GFR, Estimated: >60 mL/min (ref >60)
Glucose, Bld: 118 mg/dL — ABNORMAL HIGH (ref 70–99)
Potassium: 3.7 mmol/L (ref 3.5–5.1)
Sodium: 134 mmol/L — ABNORMAL LOW (ref 135–145)
Total Bilirubin: 0.7 mg/dL (ref 0.3–1.2)
Total Protein: 6.6 g/dL (ref 6.5–8.1)

## 2023-06-09 LAB — CBC
HCT: 45.7 % (ref 36.0–46.0)
Hemoglobin: 15.3 g/dL — ABNORMAL HIGH (ref 12.0–15.0)
MCH: 34.3 pg — ABNORMAL HIGH (ref 26.0–34.0)
MCHC: 33.5 g/dL (ref 30.0–36.0)
MCV: 102.5 fL — ABNORMAL HIGH (ref 80.0–100.0)
Platelets: 192 10*3/uL (ref 150–400)
RBC: 4.46 MIL/uL (ref 3.87–5.11)
RDW: 13.6 % (ref 11.5–15.5)
WBC: 9.7 10*3/uL (ref 4.0–10.5)
nRBC: 0 % (ref 0.0–0.2)

## 2023-06-09 LAB — URINALYSIS, ROUTINE W REFLEX MICROSCOPIC
Bilirubin Urine: NEGATIVE
Glucose, UA: NEGATIVE mg/dL
Hgb urine dipstick: NEGATIVE
Ketones, ur: 5 mg/dL — AB
Nitrite: NEGATIVE
Protein, ur: NEGATIVE mg/dL
Specific Gravity, Urine: 1.02 (ref 1.005–1.030)
pH: 7 (ref 5.0–8.0)

## 2023-06-09 LAB — LIPASE, BLOOD: Lipase: 180 U/L — ABNORMAL HIGH (ref 11–51)

## 2023-06-09 MED ORDER — LACTATED RINGERS IV BOLUS
1000.0000 mL | Freq: Once | INTRAVENOUS | Status: AC
  Administered 2023-06-09: 1000 mL via INTRAVENOUS

## 2023-06-09 MED ORDER — IOHEXOL 300 MG/ML  SOLN
100.0000 mL | Freq: Once | INTRAMUSCULAR | Status: AC | PRN
  Administered 2023-06-09: 100 mL via INTRAVENOUS

## 2023-06-09 MED ORDER — ONDANSETRON HCL 4 MG/2ML IJ SOLN
4.0000 mg | Freq: Once | INTRAMUSCULAR | Status: AC
  Administered 2023-06-09: 4 mg via INTRAVENOUS
  Filled 2023-06-09: qty 2

## 2023-06-09 MED ORDER — ONDANSETRON HCL 4 MG PO TABS: 4.0000 mg | ORAL_TABLET | Freq: Four times a day (QID) | ORAL | 0 refills | Status: DC | PRN

## 2023-06-09 MED ORDER — MORPHINE SULFATE (PF) 4 MG/ML IV SOLN
4.0000 mg | Freq: Once | INTRAVENOUS | Status: AC
  Administered 2023-06-09: 4 mg via INTRAVENOUS
  Filled 2023-06-09: qty 1

## 2023-06-09 NOTE — ED Triage Notes (Signed)
Patient stated she has been vomiting since 530pm. Vomited all night long, last time was at 8am. Has generalized abdominal pain.

## 2023-06-09 NOTE — ED Notes (Signed)
Light green resent to lab due to other tube hemolyzing

## 2023-06-09 NOTE — ED Notes (Signed)
Third light green top tubed to lab

## 2023-06-09 NOTE — ED Provider Notes (Signed)
[place] EMERGENCY DEPARTMENT AT [facility] Provider Note   CSN: [id number] Arrival date & time: 06/09/23  4098     History  Chief Complaint  Patient presents with   Abdominal Pain    Deanna Goodwin is a 62 y.o. female.   Abdominal Pain 62 year old female history of hypertension, kidney stones presenting for abdominal pain.  Started yesterday without precipitant.  Pain is generalized, worse in the right upper quadrant.  She has had multiple times nonbloody nonbilious emesis.  She is not diarrhea up until 2 days ago which was nonbloody, no bowel movement since.  She is passing flatus.  No chest pain or shortness of breath.  No back pain, dysuria, hematuria.  Does states she has had a kidney stone before that caused sepsis.  She is concerned for this.  No fevers, has had some subjective chills.     Home Medications Prior to Admission medications   Medication Sig Start Date End Date Taking? Authorizing Provider  ondansetron (ZOFRAN) 4 MG tablet Take 1 tablet (4 mg total) by mouth every 6 (six) hours as needed for nausea or vomiting. 06/09/23  Yes Glyn Ade, MD  acetaminophen (TYLENOL) 325 MG tablet Take 650 mg by mouth every 6 (six) hours as needed for moderate pain.    [provider]  amoxicillin-clavulanate (AUGMENTIN) 875-125 MG tablet Take 1 tablet by mouth every 12 (twelve) hours. 07/20/19   Cathie Hoops, Amy V, PA-C  chlorhexidine (PERIDEX) 0.12 % solution Use as directed 15 mLs in the mouth or throat 2 (two) times daily. 07/20/19   Cathie Hoops, Amy V, PA-C  clindamycin (CLEOCIN) 150 MG capsule Take 1 capsule (150 mg total) by mouth 3 (three) times daily. 07/21/19   Bethel Born, PA-C  HYDROcodone-acetaminophen (NORCO/VICODIN) 5-325 MG tablet Take 1 tablet by mouth every 4 (four) hours as needed. 07/21/19   Bethel Born, PA-C  ibuprofen (ADVIL) 800 MG tablet Take 1 tablet (800 mg total) by mouth 3 (three) times daily. 07/20/19   Belinda Fisher, PA-C       Allergies    Patient has no known allergies.    Review of Systems   Review of Systems  Gastrointestinal:  Positive for abdominal pain.  Review of systems completed and notable as per HPI.  ROS otherwise negative.   Physical Exam Updated Vital Signs BP 130/60   Pulse 73   Temp 97.6 F (36.4 C) (Oral)   Resp 16   Ht 5' 3.5" (1.613 m)   Wt 97.1 kg   LMP 07/01/2014   SpO2 96%   BMI 37.31 kg/m  Physical Exam Vitals and nursing note reviewed.  Constitutional:      General: She is not in acute distress.    Appearance: She is well-developed.  HENT:     Head: Normocephalic and atraumatic.  Eyes:     Conjunctiva/sclera: Conjunctivae normal.  Cardiovascular:     Rate and Rhythm: Normal rate and regular rhythm.     Heart sounds: No murmur heard. Pulmonary:     Effort: Pulmonary effort is normal. No respiratory distress.     Breath sounds: Normal breath sounds.  Abdominal:     Palpations: Abdomen is soft.     Tenderness: There is generalized abdominal tenderness and tenderness in the right upper quadrant. There is no guarding or rebound.  Musculoskeletal:        General: No swelling.     Cervical back: Neck supple.     Right lower leg:  No edema.     Left lower leg: No edema.  Skin:    General: Skin is warm and dry.     Capillary Refill: Capillary refill takes less than 2 seconds.  Neurological:     General: No focal deficit present.     Mental Status: She is alert.  Psychiatric:        Mood and Affect: Mood normal.     ED Results / Procedures / Treatments   Labs (all labs ordered are listed, but only abnormal results are displayed) Labs Reviewed  CBC - Abnormal; Notable for the following components:      Result Value   Hemoglobin 15.3 (*)    MCV 102.5 (*)    MCH 34.3 (*)    All other components within normal limits  URINALYSIS, ROUTINE W REFLEX MICROSCOPIC - Abnormal; Notable for the following components:   APPearance HAZY (*)    Ketones, ur 5 (*)     Leukocytes,Ua TRACE (*)    Bacteria, UA RARE (*)    All other components within normal limits  COMPREHENSIVE METABOLIC PANEL - Abnormal; Notable for the following components:   Sodium 134 (*)    Glucose, Bld 118 (*)    Calcium 8.7 (*)    Albumin 3.4 (*)    AST 97 (*)    ALT 45 (*)    All other components within normal limits  LIPASE, BLOOD - Abnormal; Notable for the following components:   Lipase 180 (*)    All other components within normal limits    EKG None  Radiology CT ABDOMEN PELVIS W CONTRAST  Result Date: 06/09/2023 CLINICAL DATA:  Vomiting generalized pain EXAM: CT ABDOMEN AND PELVIS WITH CONTRAST TECHNIQUE: Multidetector CT imaging of the abdomen and pelvis was performed using the standard protocol following bolus administration of intravenous contrast. RADIATION DOSE REDUCTION: This exam was performed according to the departmental dose-optimization program which includes automated exposure control, adjustment of the mA and/or kV according to patient size and/or use of iterative reconstruction technique. CONTRAST:  OMNIPAQUE IOHEXOL 300 MG/ML  SOLN COMPARISON:  CT 08/09/2014 FINDINGS: Lower chest: Lung bases demonstrate no acute airspace disease. Cardiomegaly. Hepatobiliary: The liver is slightly enlarged at 18 cm. Diffuse hypodensity consistent with steatosis. Heterogeneous more focal fat deposition near falciform ligament. No calcified gallstone or biliary dilatation Pancreas: No ductal dilatation. No definitive peripancreatic inflammation. Spleen: Normal in size without focal abnormality. Adrenals/Urinary Tract: Adrenal glands are normal. Kidneys show no hydronephrosis. Cortical scarring in the right greater than left kidneys. Large staghorn calculus upper pole right kidney. Small nonobstructing lower pole stone left kidney. Bladder is unremarkable. Stomach/Bowel: The stomach is nonenlarged. Soft tissue stranding and small volume fluid best seen on coronal views within the  upper abdomen, appears to be centered about the second and third portion of duodenum, coronal series 7 images sixty-one through 69. Negative appendix. Diverticular disease of the colon. Vascular/Lymphatic: Mild aortic atherosclerosis. No aneurysm. No suspicious lymph nodes Reproductive: Enlarged globular uterus with fibroids. No adnexal mass Other: Tiny fluid and stranding within the right anterior pararenal space. Musculoskeletal: No acute or suspicious osseous abnormality IMPRESSION: 1. Soft tissue stranding and small volume fluid within the upper abdomen, appears to be centered about the second and third portion of duodenum. Duodenum appears indistinct with suspected mucosal enhancement, findings are suggestive of duodenitis. Other causes for trace fluid and inflammation in this distribution could include pancreatitis and correlation with enzymes is advised. There is no evidence for perforation. 2.  Hepatomegaly with steatosis. 3. Diverticular disease of the colon without acute inflammatory process. 4. Large staghorn calculus upper pole right kidney. Small nonobstructing stone lower pole left kidney. No hydronephrosis 5. Enlarged globular uterus with fibroids. 6. Aortic atherosclerosis. Aortic Atherosclerosis (ICD10-I70.0). Electronically Signed   By: Jasmine Pang M.D.   On: 06/09/2023 16:30    Procedures Procedures    Medications Ordered in ED Medications  lactated ringers bolus 1,000 mL (0 mLs Intravenous Stopped 06/09/23 1409)  ondansetron (ZOFRAN) injection 4 mg (4 mg Intravenous Given 06/09/23 1224)  morphine (PF) 4 MG/ML injection 4 mg (4 mg Intravenous Given 06/09/23 1224)  iohexol (OMNIPAQUE) 300 MG/ML solution 100 mL (100 mLs Intravenous Contrast Given 06/09/23 1502)    ED Course/ Medical Decision Making/ A&P Clinical Course as of 06/10/23 0656  Thu Jun 09, 2023  1607 Stable HO from JD RUQ more severe  CT pending read Consider RUQ  [CC]    Clinical Course User Index [CC] Glyn Ade, MD                                 Medical Decision Making Amount and/or Complexity of Data Reviewed Labs: ordered. Radiology: ordered.  Risk Prescription drug management.   Medical Decision Making:   AYANNI OSU is a 62 y.o. female who presented to the ED today with abdominal pain, vomiting.  On exam she is generalized tenderness worse in the right upper quadrant.  I am concerned for possible cholecystitis, but also has history of kidney stones, prior sepsis consider pyelonephritis as well.  Also consider obstruction, pancreatitis.  Will obtain CT scan, lab workup.   Patient placed on continuous vitals and telemetry monitoring while in ED which was reviewed periodically.  Reviewed and confirmed nursing documentation for past medical history, family history, social history.  Reassessment and Plan:   I reviewed, notable for sodium 134, elevated lipase mildly, mild transaminitis.  CBC without leukocytosis.  Urinalysis has some bacteria and leukocytes although significant squamous cells as well.  CT scan is pending.  Handoff given to Dr. Doran Durand at 4 PM with plan to follow-up CT scan to reassess.   Patient's presentation is most consistent with acute complicated illness / injury requiring diagnostic workup.           Final Clinical Impression(s) / ED Diagnoses Final diagnoses:  Acute pancreatitis, unspecified complication status, unspecified pancreatitis type    Rx / DC Orders ED Discharge Orders          Ordered    ondansetron (ZOFRAN) 4 MG tablet  Every 6 hours PRN        06/09/23 1645              Laurence Spates, MD 06/10/23 [phone]

## 2023-06-09 NOTE — ED Notes (Signed)
Light green hemolyzed, per lab, recollect.

## 2023-06-09 NOTE — ED Notes (Signed)
3 light green tubes sent on pt, lab called immediately after sending and asked to run test.

## 2023-06-09 NOTE — ED Notes (Signed)
Lab called and states the light green is hemolyzed for the third time. This RN requests a phlebotomist to come draw pt blood work

## 2023-06-09 NOTE — ED Notes (Signed)
Lab states the light green top has hemolyzed once again, lab was never placed in process when sent at 1307. Will resend light green tube for the third time. Lab has been notified that tube will be coming

## 2023-06-09 NOTE — ED Notes (Signed)
Pt requesting something to eat. Dr. Doran Durand notified

## 2023-06-09 NOTE — ED Provider Notes (Signed)
Care of patient received from prior provider at 4:47 PM, please see their note for complete H/P and care plan.  Received handoff per ED course.  Clinical Course as of 06/09/23 1647  Thu Jun 09, 2023  1607 Stable HO from JD RUQ more severe  CT pending read Consider RUQ  [CC]    Clinical Course User Index [CC] Glyn Ade, MD    Reassessment: Patient's history of present on his physical exam is consistent with pancreatitis.  Reevaluated at bedside.  Presentation is most consistent with pancreatitis less consistent with duodenitis given no history of similar.  Symptoms are very mild at this time she is ambulatory she is able to tolerate p.o. intake at this time and does not feel that she needs any further care at this time. Offered admission for her pancreatitis but she does not feel its necessary and pain is currently under control.  She does not want any pain medication at home would rather use Tylenol and Advil as well as antiemetics given gross improvement of her symptoms. Patient ambulatory tolerating p.o. intake on reassessment stable for outpatient care management.  Disposition:  I have considered need for hospitalization, however, considering all of the above, I believe this patient is stable for discharge at this time.  Patient/family educated about specific return precautions for given chief complaint and symptoms.  Patient/family educated about follow-up with PCP.     Patient/family expressed understanding of return precautions and need for follow-up. Patient spoken to regarding all imaging and laboratory results and appropriate follow up for these results. All education provided in verbal form with additional information in written form. Time was allowed for answering of patient questions. Patient discharged.    Emergency Department Medication Summary:   Medications  lactated ringers bolus 1,000 mL (0 mLs Intravenous Stopped 06/09/23 1409)  ondansetron (ZOFRAN) injection  4 mg (4 mg Intravenous Given 06/09/23 1224)  morphine (PF) 4 MG/ML injection 4 mg (4 mg Intravenous Given 06/09/23 1224)  iohexol (OMNIPAQUE) 300 MG/ML solution 100 mL (100 mLs Intravenous Contrast Given 06/09/23 1502)           Glyn Ade, MD 06/09/23 1650

## 2023-09-22 ENCOUNTER — Encounter: Payer: Self-pay | Admitting: Family Medicine

## 2023-09-22 ENCOUNTER — Telehealth: Payer: Self-pay | Admitting: *Deleted

## 2023-09-22 ENCOUNTER — Ambulatory Visit: Payer: Managed Care, Other (non HMO) | Admitting: Family Medicine

## 2023-09-22 VITALS — BP 132/85 | HR 84 | Temp 98.2°F | Resp 16 | Ht 63.5 in | Wt 216.0 lb

## 2023-09-22 DIAGNOSIS — R109 Unspecified abdominal pain: Secondary | ICD-10-CM | POA: Diagnosis not present

## 2023-09-22 DIAGNOSIS — K5790 Diverticulosis of intestine, part unspecified, without perforation or abscess without bleeding: Secondary | ICD-10-CM

## 2023-09-22 DIAGNOSIS — Z720 Tobacco use: Secondary | ICD-10-CM

## 2023-09-22 DIAGNOSIS — R160 Hepatomegaly, not elsewhere classified: Secondary | ICD-10-CM | POA: Diagnosis not present

## 2023-09-22 DIAGNOSIS — F1010 Alcohol abuse, uncomplicated: Secondary | ICD-10-CM

## 2023-09-22 DIAGNOSIS — Z23 Encounter for immunization: Secondary | ICD-10-CM | POA: Diagnosis not present

## 2023-09-22 DIAGNOSIS — Z6837 Body mass index (BMI) 37.0-37.9, adult: Secondary | ICD-10-CM

## 2023-09-22 DIAGNOSIS — Z7689 Persons encountering health services in other specified circumstances: Secondary | ICD-10-CM

## 2023-09-22 DIAGNOSIS — F411 Generalized anxiety disorder: Secondary | ICD-10-CM

## 2023-09-22 DIAGNOSIS — E66812 Obesity, class 2: Secondary | ICD-10-CM

## 2023-09-22 DIAGNOSIS — Z599 Problem related to housing and economic circumstances, unspecified: Secondary | ICD-10-CM

## 2023-09-22 NOTE — Progress Notes (Signed)
Complex Care Management Note Care Guide Note  09/22/2023 Name: LASHONDA COMTOIS MRN: [id number] DOB: 07/21/1961   Complex Care Management Outreach Attempts: An unsuccessful telephone outreach was attempted today to offer the patient information about available complex care management services.  Follow Up Plan:  Additional outreach attempts will be made to offer the patient complex care management information and services.   Encounter Outcome:  No Answer  Gwenevere Ghazi  Care Coordination Care Guide  Direct Dial: [phone]
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
## 2023-09-23 NOTE — Progress Notes (Unsigned)
Complex Care Management Note Care Guide Note  09/23/2023 Name: JAKARRA LEITHEAD MRN: [id number] DOB: 07/30/1961   Complex Care Management Outreach Attempts: A second unsuccessful outreach was attempted today to offer the patient with information about available complex care management services.  Follow Up Plan:  Additional outreach attempts will be made to offer the patient complex care management information and services.   Encounter Outcome:  No Answer  Gwenevere Ghazi  Care Coordination Care Guide  Direct Dial: [phone]

## 2023-09-29 ENCOUNTER — Telehealth: Payer: Self-pay | Admitting: *Deleted

## 2023-09-29 NOTE — Progress Notes (Signed)
 Complex Care Management Note Care Guide Note  09/29/2023 Name: Deanna Goodwin MRN: [id number] DOB: [birth year]   Complex Care Management Outreach Attempts: A third unsuccessful outreach was attempted today to offer the patient with information about available complex care management services.  Follow Up Plan:  No further outreach attempts will be made at this time. We have been unable to contact the patient to offer or enroll patient in complex care management services.  Encounter Outcome:  No Answer  Harlene Satterfield  Care Coordination Care Guide  Direct Dial: [phone]

## 2023-11-03 ENCOUNTER — Telehealth: Payer: Self-pay | Admitting: Family Medicine

## 2023-11-03 ENCOUNTER — Encounter: Payer: Managed Care, Other (non HMO) | Admitting: Family Medicine

## 2023-11-03 NOTE — Telephone Encounter (Signed)
 Called pt to reschedule missed appt; could not leave vm due to full vm box

## 2024-08-18 ENCOUNTER — Encounter (HOSPITAL_COMMUNITY): Payer: Self-pay

## 2024-08-18 ENCOUNTER — Other Ambulatory Visit: Payer: Self-pay

## 2024-08-18 ENCOUNTER — Emergency Department (HOSPITAL_COMMUNITY)

## 2024-08-18 ENCOUNTER — Emergency Department (HOSPITAL_COMMUNITY)
Admission: EM | Admit: 2024-08-18 | Discharge: 2024-08-19 | Disposition: A | Discharge status: Home / Self Care | Attending: Emergency Medicine | Admitting: Emergency Medicine

## 2024-08-18 DIAGNOSIS — B3731 Acute candidiasis of vulva and vagina: Secondary | ICD-10-CM | POA: Diagnosis not present

## 2024-08-18 DIAGNOSIS — K6289 Other specified diseases of anus and rectum: Secondary | ICD-10-CM | POA: Diagnosis present

## 2024-08-18 DIAGNOSIS — E871 Hypo-osmolality and hyponatremia: Secondary | ICD-10-CM | POA: Insufficient documentation

## 2024-08-18 DIAGNOSIS — E876 Hypokalemia: Secondary | ICD-10-CM | POA: Insufficient documentation

## 2024-08-18 DIAGNOSIS — E860 Dehydration: Secondary | ICD-10-CM

## 2024-08-18 LAB — CBC
HCT: 41.5 % (ref 36.0–46.0)
Hemoglobin: 14.7 g/dL (ref 12.0–15.0)
MCH: 34.5 pg — ABNORMAL HIGH (ref 26.0–34.0)
MCHC: 35.4 g/dL (ref 30.0–36.0)
MCV: 97.4 fL (ref 80.0–100.0)
Platelets: 250 K/uL (ref 150–400)
RBC: 4.26 MIL/uL (ref 3.87–5.11)
RDW: 12.9 % (ref 11.5–15.5)
WBC: 7.1 K/uL (ref 4.0–10.5)
nRBC: 0 % (ref 0.0–0.2)

## 2024-08-18 LAB — LIPASE, BLOOD: Lipase: 48 U/L (ref 11–51)

## 2024-08-18 LAB — WET PREP, GENITAL
Clue Cells Wet Prep HPF POC: NONE SEEN
Sperm: NONE SEEN
Trich, Wet Prep: NONE SEEN
WBC, Wet Prep HPF POC: <10 (ref <10)
Yeast Wet Prep HPF POC: NONE SEEN

## 2024-08-18 LAB — URINALYSIS, W/ REFLEX TO CULTURE (INFECTION SUSPECTED)
Bilirubin Urine: NEGATIVE
Glucose, UA: NEGATIVE mg/dL
Ketones, ur: 20 mg/dL — AB
Nitrite: NEGATIVE
Protein, ur: NEGATIVE mg/dL
Specific Gravity, Urine: >1.046 — ABNORMAL HIGH (ref 1.005–1.030)
pH: 6 (ref 5.0–8.0)

## 2024-08-18 LAB — COMPREHENSIVE METABOLIC PANEL WITH GFR
ALT: 20 U/L (ref 0–44)
AST: 63 U/L — ABNORMAL HIGH (ref 15–41)
Albumin: 2.9 g/dL — ABNORMAL LOW (ref 3.5–5.0)
Alkaline Phosphatase: 92 U/L (ref 38–126)
Anion gap: 20 — ABNORMAL HIGH (ref 5–15)
BUN: <5 mg/dL — ABNORMAL LOW (ref 8–23)
CO2: 23 mmol/L (ref 22–32)
Calcium: 8.8 mg/dL — ABNORMAL LOW (ref 8.9–10.3)
Chloride: 88 mmol/L — ABNORMAL LOW (ref 98–111)
Creatinine, Ser: 0.75 mg/dL (ref 0.44–1.00)
GFR, Estimated: >60 mL/min (ref >60)
Glucose, Bld: 137 mg/dL — ABNORMAL HIGH (ref 70–99)
Potassium: 3 mmol/L — ABNORMAL LOW (ref 3.5–5.1)
Sodium: 131 mmol/L — ABNORMAL LOW (ref 135–145)
Total Bilirubin: 1.3 mg/dL — ABNORMAL HIGH (ref 0.0–1.2)
Total Protein: 7.1 g/dL (ref 6.5–8.1)

## 2024-08-18 LAB — POC OCCULT BLOOD, ED: Fecal Occult Bld: NEGATIVE

## 2024-08-18 LAB — CBG MONITORING, ED: Glucose-Capillary: 161 mg/dL — ABNORMAL HIGH (ref 70–99)

## 2024-08-18 LAB — MAGNESIUM: Magnesium: 1.4 mg/dL — ABNORMAL LOW (ref 1.7–2.4)

## 2024-08-18 MED ORDER — MAGNESIUM SULFATE 2 GM/50ML IV SOLN: 2.0000 g | Freq: Once | INTRAVENOUS | Status: DC

## 2024-08-18 MED ORDER — NYSTATIN 100000 UNIT/GM EX POWD
Freq: Once | CUTANEOUS | Status: AC
  Filled 2024-08-18: qty 15

## 2024-08-18 MED ORDER — POTASSIUM CHLORIDE CRYS ER 20 MEQ PO TBCR
40.0000 meq | EXTENDED_RELEASE_TABLET | Freq: Once | ORAL | Status: AC
Start: 2024-08-18 — End: 2024-08-18
  Administered 2024-08-18: 40 meq via ORAL
  Filled 2024-08-18: qty 2

## 2024-08-18 MED ORDER — MAGNESIUM OXIDE -MG SUPPLEMENT 400 (240 MG) MG PO TABS
800.0000 mg | ORAL_TABLET | Freq: Once | ORAL | Status: AC
  Administered 2024-08-18: 800 mg via ORAL
  Filled 2024-08-18: qty 2

## 2024-08-18 MED ORDER — NYSTATIN 100000 UNIT/GM EX CREA: TOPICAL_CREAM | CUTANEOUS | 0 refills | Status: AC

## 2024-08-18 MED ORDER — MORPHINE SULFATE (PF) 4 MG/ML IV SOLN
4.0000 mg | Freq: Once | INTRAVENOUS | Status: AC
  Administered 2024-08-18: 4 mg via INTRAVENOUS
  Filled 2024-08-18: qty 1

## 2024-08-18 MED ORDER — MAGNESIUM CITRATE PO SOLN: 1.0000 | Freq: Once | ORAL | Status: DC

## 2024-08-18 MED ORDER — ONDANSETRON HCL 4 MG/2ML IJ SOLN
4.0000 mg | Freq: Once | INTRAMUSCULAR | Status: AC
  Administered 2024-08-18: 4 mg via INTRAVENOUS
  Filled 2024-08-18: qty 2

## 2024-08-18 MED ORDER — ACETAMINOPHEN 500 MG PO TABS
1000.0000 mg | ORAL_TABLET | Freq: Once | ORAL | Status: AC
  Administered 2024-08-19: 1000 mg via ORAL
  Filled 2024-08-18: qty 2

## 2024-08-18 MED ORDER — LACTATED RINGERS IV BOLUS
1000.0000 mL | Freq: Once | INTRAVENOUS | Status: AC
  Administered 2024-08-18: 1000 mL via INTRAVENOUS

## 2024-08-18 MED ORDER — IOHEXOL 350 MG/ML SOLN
75.0000 mL | Freq: Once | INTRAVENOUS | Status: AC | PRN
  Administered 2024-08-18: 75 mL via INTRAVENOUS

## 2024-08-18 NOTE — ED Triage Notes (Signed)
 C/o genital swelling x 1 months c/o being worse today and states it hurts to walk

## 2024-08-18 NOTE — ED Notes (Signed)
 Vital signs stable.

## 2024-08-18 NOTE — ED Notes (Signed)
 PT O2 saturation 85% on room air. RN notified.

## 2024-08-18 NOTE — ED Provider Notes (Signed)
 Deanna Goodwin EMERGENCY DEPARTMENT AT [facility] Provider Note   CSN: [id number] Arrival date & time: 08/18/24  1426     Patient presents with: Rectal Pain   Deanna Goodwin is a 63 y.o. female who presents to the emergency department with 1 week of progressive nausea, vomiting, abdominal discomfort, and sensation of internal swelling from the rectum to the vagina.  She reports alternating constipation and diarrhea, decreased appetite over the last several weeks, and increased weakness with subjective extremity tingling.  She also reports feeling very dehydrated.  Patient denies hematochezia, melena, fevers, urinary symptoms, or vaginal bleeding/discharge.  No recent trauma or new medications.  No syncope.  No chest pain or shortness of breath.   HPI     Prior to Admission medications   Medication Sig Start Date End Date Taking? Authorizing Provider  acetaminophen  (TYLENOL ) 325 MG tablet Take 650 mg by mouth every 6 (six) hours as needed for moderate pain. Patient not taking: Reported on 09/22/2023    [provider]  amoxicillin -clavulanate (AUGMENTIN ) 875-125 MG tablet Take 1 tablet by mouth every 12 (twelve) hours. Patient not taking: Reported on 09/22/2023 07/20/19   Babara Greig GAILS, PA-C  chlorhexidine  (PERIDEX ) 0.12 % solution Use as directed 15 mLs in the mouth or throat 2 (two) times daily. Patient not taking: Reported on 09/22/2023 07/20/19   Babara Greig GAILS, PA-C  clindamycin  (CLEOCIN ) 150 MG capsule Take 1 capsule (150 mg total) by mouth 3 (three) times daily. Patient not taking: Reported on 09/22/2023 07/21/19   Odis Burnard Jansky, PA-C  HYDROcodone -acetaminophen  (NORCO/VICODIN) 5-325 MG tablet Take 1 tablet by mouth every 4 (four) hours as needed. Patient not taking: Reported on 09/22/2023 07/21/19   Odis Burnard Jansky, PA-C  ibuprofen  (ADVIL ) 800 MG tablet Take 1 tablet (800 mg total) by mouth 3 (three) times daily. Patient not taking: Reported on 09/22/2023  07/20/19   Babara Greig GAILS, PA-C  ondansetron  (ZOFRAN ) 4 MG tablet Take 1 tablet (4 mg total) by mouth every 6 (six) hours as needed for nausea or vomiting. Patient not taking: Reported on 09/22/2023 06/09/23   Jerral Meth, MD    Allergies: Patient has no known allergies.    Review of Systems  Genitourinary:  Positive for pelvic pain.    Updated Vital Signs BP (!) 146/80 (BP Location: Right Arm)   Pulse 86   Temp 98.7 F (37.1 C) (Oral)   Resp 19   Ht 5' 3.5 (1.613 m)   Wt 95.3 kg   LMP 07/01/2014   SpO2 96%   BMI 36.62 kg/m   Physical Exam Vitals and nursing note reviewed. Exam conducted with a chaperone present.  Constitutional:      General: She is not in acute distress.    Appearance: Normal appearance.  HENT:     Head: Normocephalic and atraumatic.     Mouth/Throat:     Comments: Mucous membranes slightly dry.  Eyes:     Extraocular Movements: Extraocular movements intact.     Conjunctiva/sclera: Conjunctivae normal.     Pupils: Pupils are equal, round, and reactive to light.  Cardiovascular:     Rate and Rhythm: Normal rate and regular rhythm.     Pulses: Normal pulses.  Pulmonary:     Effort: Pulmonary effort is normal. No respiratory distress.     Comments: Patient has no difficulty speaking complete sentences. Abdominal:     General: Abdomen is flat.     Palpations: Abdomen is soft.  Tenderness: There is no abdominal tenderness.  Genitourinary:    Exam position: Lithotomy position.     Labia:        Right: Rash and tenderness present.        Left: Rash and tenderness present.      Comments: No vaginal swelling, lesions, discharge, or bleeding.  Cervix normal.  No adnexal tenderness or masses palpated.  Diffuse erythema and mild swelling throughout the groin consistent with Candida infection - tender to palpation.  Rectal exam had no external or internal swelling or abscesses identified.  No bleeding internal hemorrhoids noted.  Hemoccult  negative. Musculoskeletal:        General: Normal range of motion.     Cervical back: Normal range of motion.  Skin:    General: Skin is warm and dry.     Capillary Refill: Capillary refill takes less than 2 seconds.  Neurological:     General: No focal deficit present.     Mental Status: She is alert. Mental status is at baseline.     Comments: Grossly intact.  No focal deficits.  Strength and sensation preserved in all extremities.  No objective numbness or tingling.  Psychiatric:        Mood and Affect: Mood normal.     (all labs ordered are listed, but only abnormal results are displayed) Labs Reviewed  COMPREHENSIVE METABOLIC PANEL WITH GFR - Abnormal; Notable for the following components:      Result Value   Sodium 131 (*)    Potassium 3.0 (*)    Chloride 88 (*)    Glucose, Bld 137 (*)    BUN <5 (*)    Calcium  8.8 (*)    Albumin 2.9 (*)    AST 63 (*)    Total Bilirubin 1.3 (*)    Anion gap 20 (*)    All other components within normal limits  CBC - Abnormal; Notable for the following components:   MCH 34.5 (*)    All other components within normal limits  MAGNESIUM  - Abnormal; Notable for the following components:   Magnesium  1.4 (*)    All other components within normal limits  URINALYSIS, W/ REFLEX TO CULTURE (INFECTION SUSPECTED) - Abnormal; Notable for the following components:   APPearance HAZY (*)    Specific Gravity, Urine >1.046 (*)    Hgb urine dipstick SMALL (*)    Ketones, ur 20 (*)    Leukocytes,Ua TRACE (*)    Bacteria, UA RARE (*)    All other components within normal limits  CBG MONITORING, ED - Abnormal; Notable for the following components:   Glucose-Capillary 161 (*)    All other components within normal limits  WET PREP, GENITAL  LIPASE, BLOOD  POC OCCULT BLOOD, ED    EKG: None  Radiology: CT ABDOMEN PELVIS W CONTRAST Result Date: 08/18/2024 EXAM: CT ABDOMEN AND PELVIS WITH CONTRAST 08/18/2024 05:40:27 PM TECHNIQUE: CT of the abdomen  and pelvis was performed with the administration of 75 mL of iohexol  (OMNIPAQUE ) 350 MG/ML injection. Multiplanar reformatted images are provided for review. Automated exposure control, iterative reconstruction, and/or weight-based adjustment of the mA/kV was utilized to reduce the radiation dose to as low as reasonably achievable. COMPARISON: None available. CLINICAL HISTORY: LLQ abdominal pain. FINDINGS: LOWER CHEST: Linear atelectasis or scarring in the lung bases. LIVER: The liver is unremarkable. GALLBLADDER AND BILE DUCTS: Gallbladder is unremarkable. No biliary ductal dilatation. SPLEEN: Spleen is normal. PANCREAS: Edema and stranding around the body and tail of  the pancreas with a complex fluid collection adjacent to the tail of the pancreas and extending to the spleen hilum measuring 4.3 cm diameter. Changes are consistent with acute pancreatitis with developing peripancreatic collection. Follow-up for resolution of acute process is recommended to exclude an underlying focal pancreatic lesion. ADRENAL GLANDS: No acute abnormality. KIDNEYS, URETERS AND BLADDER: Multiple bilateral renal stones. The largest on the right measures 2.2 cm diameter. No hydronephrosis. No change since prior study. No perinephric or periureteral stranding. Urinary bladder is unremarkable. GI AND BOWEL: Stomach, small bowel, colon, and appendix are normal. There is no bowel obstruction. PERITONEUM AND RETROPERITONEUM: No ascites. No free air. No free fluid in the abdomen. VASCULATURE: Aorta is normal in caliber. LYMPH NODES: No lymphadenopathy. REPRODUCTIVE ORGANS: Diffuse enlargement of the uterus consistent with uterine fibroids. No abnormal adnexal masses. BONES AND SOFT TISSUES: No acute osseous abnormality. No focal soft tissue abnormality. IMPRESSION: 1. Acute pancreatitis with developing peripancreatic collection; recommend imaging follow-up to document resolution and exclude an underlying focal pancreatic lesion. 2. Multiple  bilateral renal stones without hydronephrosis, largest on the right measuring 2.2 cm, unchanged from prior study. 3. Diffuse uterine enlargement consistent with fibroids, without abnormal adnexal masses. Electronically signed by: Elsie Gravely MD 08/18/2024 05:47 PM EST RP Workstation: [id number]     Procedures   Medications Ordered in the ED  nystatin  (MYCOSTATIN /NYSTOP ) topical powder (has no administration in time range)  ondansetron  (ZOFRAN ) injection 4 mg (4 mg Intravenous Given 08/18/24 1659)  morphine  (PF) 4 MG/ML injection 4 mg (4 mg Intravenous Given 08/18/24 1701)  iohexol  (OMNIPAQUE ) 350 MG/ML injection 75 mL (75 mLs Intravenous Contrast Given 08/18/24 1740)  potassium chloride  SA (KLOR-CON  M) CR tablet 40 mEq (40 mEq Oral Given 08/18/24 2228)  lactated ringers  bolus 1,000 mL (1,000 mLs Intravenous New Bag/Given 08/18/24 2226)  magnesium  oxide (MAG-OX) tablet 800 mg (800 mg Oral Given 08/18/24 2227)    Clinical Course as of 08/18/24 2233  Sat Aug 18, 2024  2137 S, feeling rectal and vaginal pressure like something is going to fall out of her. N/V 2 weeks. P/w dehydration concerns, pelvic unremarkable. CT abd early pancreatitis, lipase negative. F/u fluids, nystatin , mag, and K, and then d/c stable [BS]    Clinical Course User Index [BS] Arlee Katz, MD                                 Medical Decision Making Amount and/or Complexity of Data Reviewed Labs: ordered. Radiology: ordered.  Risk Prescription drug management.   Patient presents to the ED for concern of multiple complaints, including N/V/D and the feeling of genital swelling, this involves an extensive number of treatment options.  The differential diagnosis includes: Anorectal abscess Pelvic prolapse Other infectious etiology Gastrointestinal etiology  Co morbidities that complicate the patient evaluation: Hypertension  Additional history obtained: Additional history obtained from  Outside  Medical Records  External records from outside source obtained and reviewed including medical history, surgical history, medications, allergies. The patient was a reliable historian, providing a clear, detailed, and consistent account of the presenting symptoms and relevant medical history. The information was obtained directly from the patient and statements were documented in the patient's own words when possible. No discrepancies were noted between the history provided and available collateral sources.     Lab Tests: I ordered, and personally interpreted labs.   The pertinent results include:  Urinalysis -microhematuria, trace leukocytes CBC -no acute  findings POC CBG -161 Wet prep -negative Hemoccult -negative Lipase -WNL Magnesium  -1.4 CMP -hypokalemia, slight hyponatremia  Imaging Studies ordered: I ordered imaging studies including: CT abdomen pelvis with contrast I independently visualized and interpreted imaging which showed: Acute pancreatitis with developing peripancreatic collection; recommend imaging follow-up to document resolution and exclude an underlying focal pancreatic lesion.  Multiple bilateral renal stones without hydronephrosis, largest on the right measuring 2.2 cm, unchanged from prior study.  Diffuse uterine enlargement consistent with fibroids, without abnormal adnexal masses. I agree with the radiologist interpretation  Cardiac Monitoring: The patient was maintained on a cardiac monitor.  I personally viewed and interpreted the cardiac monitored which showed an underlying rhythm of: Sinus rhythm  Medicines ordered and prescription drug management: I ordered medications: morphine  for pain management Zofran  for nausea Lactated ringer  bolus 1000 mL for rehydration Potassium supplement for hypokalemia Nystatin  topical powder for Candida infection Reevaluation of the patient after these medicines showed that the patient improved I have reviewed the  patients home medicines and have made adjustments as needed  Test Considered: No additional diagnostic testing was considered based on the patient's presenting symptoms, risk factors, and initial clinical assessment.  The approach to diagnostic testing prioritized exclusion of life-threatening conditions  Problem List / ED Course: Problem List: Vaginal/rectal discomfort Nausea/vomiting/diarrhea Emergency Department Course: The patient presented with multiple complaints including vaginal and rectal discomfort, and nausea/vomiting/diarrhea. Initial assessment included history, physical exam, and review of prior medical records.  Given her abdominal discomfort, subjective dehydration, and weakness, a broad evaluation was performed.  Laboratory studies were notable for hypokalemia, mild hyponatremia, hypomagnesia, and elevated anion gap consistent with dehydration.  CBC was unremarkable, lipase was normal, and urinalysis showed only trace leukocytes.  CT abdomen/pelvis demonstrated early pancreatitis and nonobstructing kidney stones without diverticulitis, abscess, or obstruction.  Pelvic and rectal exams were largely reassuring, with no masses or swelling identified other than nonbleeding hemorrhoids, however, I diffuse groin fungal infection with erythema and tenderness was present and likely accounts for the patient's perceived swelling.  Neurologic exam remained normal despite subjective tingling.  The patient was treated with IV fluids, potassium repletion, antiemetics, analgesia, and topical nystatin . She remained hemodynamically stable and improved condition.  At the time of handoff to Dr. Arlee she was tolerating treatment well and anticipated to be appropriate for discharge after final reassessment by Dr. Arlee.  Reevaluation: After the interventions noted above, I reevaluated the patient and found that they have :improved   Dispostion: Care of this patient was signed out to Dr. Arlee at  shift change.  At the time of handoff, the patient remained hemodynamically stable, in no acute distress, and reported symptomatic improvement.  She is awaiting completion of lactated ringer  bolus, potassium chloride , and nystatin  treatment.  All imaging and labs have been resulted and reviewed, including CT findings.  No further acute issues were identified on pelvic or rectal exam.  Anticipated disposition is discharge home pending Dr. Lauretha final reassessment and confirmation of continued clinical stability.      Final diagnoses:  None    ED Discharge Orders     None          Deanna Goodwin, Deanna Goodwin 08/18/24 2233    Deanna Motto, MD 08/19/24 1057

## 2024-08-18 NOTE — ED Triage Notes (Addendum)
 Pt c.o rectal swelling with constipation. Pt c.o internal swelling from my rectum to my vagina Pt also reports decreased appetite. Pt also c.o numbness in the fingers and legs x 1 month.

## 2024-08-18 NOTE — ED Notes (Signed)
 Patient transported to CT

## 2024-08-18 NOTE — Discharge Instructions (Signed)
 I discussed the plan for discharge with the patient and/or their surrogate at bedside prior to discharge and they were in agreement with the plan and verbalized understanding of the return precautions provided. All questions answered to the best of my ability. Ultimately, the patient was discharged in stable condition with stable vital signs. I am reassured that they are capable of close follow up and good social support at home.

## 2024-08-18 NOTE — ED Provider Notes (Signed)
  Physical Exam  BP (!) 146/80 (BP Location: Right Arm)   Pulse 86   Temp 98.7 F (37.1 C) (Oral)   Resp 19   Ht 5' 3.5 (1.613 m)   Wt 95.3 kg   LMP 07/01/2014   SpO2 96%   BMI 36.62 kg/m   Physical Exam Vitals and nursing note reviewed.  Constitutional:      General: She is not in acute distress.    Appearance: She is well-developed.  HENT:     Head: Normocephalic and atraumatic.     Mouth/Throat:     Mouth: Mucous membranes are moist.     Comments: Improvement in patient hydration status with repeat evaluation. Eyes:     Conjunctiva/sclera: Conjunctivae normal.  Cardiovascular:     Rate and Rhythm: Normal rate and regular rhythm.     Heart sounds: No murmur heard. Pulmonary:     Effort: Pulmonary effort is normal. No respiratory distress.     Breath sounds: Normal breath sounds.  Abdominal:     Palpations: Abdomen is soft.     Tenderness: There is no abdominal tenderness.  Musculoskeletal:        General: No swelling.     Cervical back: Neck supple.  Skin:    General: Skin is warm and dry.     Capillary Refill: Capillary refill takes less than 2 seconds.  Neurological:     Mental Status: She is alert.  Psychiatric:        Mood and Affect: Mood normal.     Procedures  Procedures  ED Course / MDM   Clinical Course as of 08/19/24 0102  Sat Aug 18, 2024  2137 S, feeling rectal and vaginal pressure like something is going to fall out of her. N/V 2 weeks. P/w dehydration concerns, pelvic unremarkable. CT abd early pancreatitis, lipase negative. F/u fluids, nystatin , mag, and K, and then d/c stable [BS]    Clinical Course User Index [BS] Arlee Katz, MD   Medical Decision Making Amount and/or Complexity of Data Reviewed Labs: ordered. Radiology: ordered.  Risk OTC drugs. Prescription drug management.   For workup today, patient with electrolyte derangement, as well as dehydration in the setting of recent nausea and vomiting episodes.  Pelvic exam  overall reassuring, evidence of candidal infection, provided nystatin .  Patient provided fluids, and electrolyte repletion, with improvement in presentation, and able to tolerate p.o. intake without difficulty.  Patient is also able to walk without difficulty.  Overall, patient is stable for discharge with continued hydration at home, and provided prescription for nystatin  cream for home to help with management of candidal infection which is likely creating pressure in the vaginal region of the patient.  Overall, patient is stable for discharge at this time.  Recommend follow-up with PCP in 3 to 4 days.       Arlee Katz, MD 08/19/24 0104    Ula Prentice SAUNDERS, MD 08/19/24 2216

## 2024-08-23 ENCOUNTER — Emergency Department (HOSPITAL_COMMUNITY)

## 2024-08-23 ENCOUNTER — Encounter (HOSPITAL_COMMUNITY): Payer: Self-pay | Admitting: Emergency Medicine

## 2024-08-23 ENCOUNTER — Other Ambulatory Visit: Payer: Self-pay

## 2024-08-23 ENCOUNTER — Emergency Department (HOSPITAL_COMMUNITY)
Admission: EM | Admit: 2024-08-23 | Discharge: 2024-08-24 | Disposition: A | Discharge status: Home / Self Care | Attending: Emergency Medicine | Admitting: Emergency Medicine

## 2024-08-23 DIAGNOSIS — R269 Unspecified abnormalities of gait and mobility: Secondary | ICD-10-CM | POA: Diagnosis not present

## 2024-08-23 DIAGNOSIS — M6281 Muscle weakness (generalized): Secondary | ICD-10-CM | POA: Diagnosis not present

## 2024-08-23 DIAGNOSIS — R202 Paresthesia of skin: Secondary | ICD-10-CM | POA: Diagnosis not present

## 2024-08-23 DIAGNOSIS — R200 Anesthesia of skin: Secondary | ICD-10-CM | POA: Diagnosis present

## 2024-08-23 DIAGNOSIS — R29898 Other symptoms and signs involving the musculoskeletal system: Secondary | ICD-10-CM

## 2024-08-23 LAB — COMPREHENSIVE METABOLIC PANEL WITH GFR
ALT: 23 U/L (ref 0–44)
AST: 77 U/L — ABNORMAL HIGH (ref 15–41)
Albumin: 3.1 g/dL — ABNORMAL LOW (ref 3.5–5.0)
Alkaline Phosphatase: 95 U/L (ref 38–126)
Anion gap: 13 (ref 5–15)
BUN: <5 mg/dL — ABNORMAL LOW (ref 8–23)
CO2: 27 mmol/L (ref 22–32)
Calcium: 8.7 mg/dL — ABNORMAL LOW (ref 8.9–10.3)
Chloride: 91 mmol/L — ABNORMAL LOW (ref 98–111)
Creatinine, Ser: 0.68 mg/dL (ref 0.44–1.00)
GFR, Estimated: >60 mL/min (ref >60)
Glucose, Bld: 151 mg/dL — ABNORMAL HIGH (ref 70–99)
Potassium: 3.5 mmol/L (ref 3.5–5.1)
Sodium: 131 mmol/L — ABNORMAL LOW (ref 135–145)
Total Bilirubin: 0.8 mg/dL (ref 0.0–1.2)
Total Protein: 6.9 g/dL (ref 6.5–8.1)

## 2024-08-23 LAB — URINALYSIS, ROUTINE W REFLEX MICROSCOPIC
Bilirubin Urine: NEGATIVE
Glucose, UA: NEGATIVE mg/dL
Hgb urine dipstick: NEGATIVE
Ketones, ur: 5 mg/dL — AB
Nitrite: NEGATIVE
Protein, ur: NEGATIVE mg/dL
Specific Gravity, Urine: 1.008 (ref 1.005–1.030)
pH: 5 (ref 5.0–8.0)

## 2024-08-23 LAB — CBC WITH DIFFERENTIAL/PLATELET
Abs Immature Granulocytes: 0.03 K/uL (ref 0.00–0.07)
Basophils Absolute: 0 K/uL (ref 0.0–0.1)
Basophils Relative: 0 %
Eosinophils Absolute: 0.1 K/uL (ref 0.0–0.5)
Eosinophils Relative: 1 %
HCT: 43 % (ref 36.0–46.0)
Hemoglobin: 14.9 g/dL (ref 12.0–15.0)
Immature Granulocytes: 0 %
Lymphocytes Relative: 18 %
Lymphs Abs: 1.2 K/uL (ref 0.7–4.0)
MCH: 34.3 pg — ABNORMAL HIGH (ref 26.0–34.0)
MCHC: 34.7 g/dL (ref 30.0–36.0)
MCV: 98.9 fL (ref 80.0–100.0)
Monocytes Absolute: 0.6 K/uL (ref 0.1–1.0)
Monocytes Relative: 8 %
Neutro Abs: 4.9 K/uL (ref 1.7–7.7)
Neutrophils Relative %: 73 %
Platelets: 276 K/uL (ref 150–400)
RBC: 4.35 MIL/uL (ref 3.87–5.11)
RDW: 13 % (ref 11.5–15.5)
WBC: 6.8 K/uL (ref 4.0–10.5)
nRBC: 0 % (ref 0.0–0.2)

## 2024-08-23 LAB — LIPASE, BLOOD: Lipase: 38 U/L (ref 11–51)

## 2024-08-23 LAB — ETHANOL: Alcohol, Ethyl (B): <15 mg/dL (ref <15)

## 2024-08-23 LAB — MAGNESIUM: Magnesium: 1.4 mg/dL — ABNORMAL LOW (ref 1.7–2.4)

## 2024-08-23 MED ORDER — HYDROCORTISONE ACETATE 25 MG RE SUPP: 25.0000 mg | Freq: Two times a day (BID) | RECTAL | 0 refills | Status: AC

## 2024-08-23 MED ORDER — MAGNESIUM SULFATE 2 GM/50ML IV SOLN
2.0000 g | Freq: Once | INTRAVENOUS | Status: AC
  Administered 2024-08-23: 2 g via INTRAVENOUS
  Filled 2024-08-23: qty 50

## 2024-08-23 MED ORDER — SODIUM CHLORIDE 0.9 % IV BOLUS
1000.0000 mL | Freq: Once | INTRAVENOUS | Status: AC
  Administered 2024-08-23: 1000 mL via INTRAVENOUS

## 2024-08-23 NOTE — ED Notes (Signed)
 Pt to MRI
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
## 2024-08-23 NOTE — ED Notes (Signed)
 Attempt to ambulate pt, unable to safely stand without assistance, once standing pt unsteady and wobbling back and forth, unable to take step forward. Pt safely returned to stretcher

## 2024-08-23 NOTE — ED Notes (Signed)
 Pt placed on 2L. O2 was low 80s.

## 2024-08-23 NOTE — ED Notes (Signed)
 Pt attempted to provide urine, but was unsuccessful.

## 2024-08-23 NOTE — ED Notes (Signed)
 Pt to CT

## 2024-08-23 NOTE — ED Provider Notes (Signed)
 11:25 PM Assumed care from Dr. Towana, please see their note for full history, physical and decision making until this point. In brief this is a 63 y.o. year old female who presented to the ED tonight with Numbness     Multiple symptoms. Recent pancreatitis dx. Drinks quite a bit. Can feel touch. Good motor. Neuro recommended MRI, reassuring. Pending d/c.  Patient asked for hemorrhoid cream Rx prior to D/C.  Discharge instructions, including strict return precautions for new or worsening symptoms, given. Patient and/or family verbalized understanding and agreement with the plan as described.   Labs, studies and imaging reviewed by myself and considered in medical decision making if ordered. Imaging interpreted by radiology.  Labs Reviewed  COMPREHENSIVE METABOLIC PANEL WITH GFR - Abnormal; Notable for the following components:      Result Value   Sodium 131 (*)    Chloride 91 (*)    Glucose, Bld 151 (*)    BUN <5 (*)    Calcium  8.7 (*)    Albumin 3.1 (*)    AST 77 (*)    All other components within normal limits  CBC WITH DIFFERENTIAL/PLATELET - Abnormal; Notable for the following components:   MCH 34.3 (*)    All other components within normal limits  MAGNESIUM  - Abnormal; Notable for the following components:   Magnesium  1.4 (*)    All other components within normal limits  URINALYSIS, ROUTINE W REFLEX MICROSCOPIC - Abnormal; Notable for the following components:   APPearance HAZY (*)    Ketones, ur 5 (*)    Leukocytes,Ua SMALL (*)    Bacteria, UA RARE (*)    All other components within normal limits  ETHANOL  LIPASE, BLOOD  TSH  VITAMIN B12  FOLATE    MR BRAIN WO CONTRAST  Final Result    CT Head Wo Contrast  Final Result      No follow-ups on file.    Jaysun Wessels, Selinda, MD 08/25/24 [phone]

## 2024-08-23 NOTE — ED Triage Notes (Addendum)
 Patient from home BIB [facility] w/ complaints of bilateral leg, hand and arm numbness x1 week now. This has been a constant numbness  Overall globally weak. No other reports of neurological deficits noted. Patient complains of nausea currently and emesis every day. Aox4 GCS 15.   148/84 BP HR 93 Spo2-97% RA Resp 20

## 2024-08-24 LAB — TSH: TSH: 5.313 u[IU]/mL — ABNORMAL HIGH (ref 0.350–4.500)

## 2024-08-24 LAB — VITAMIN B12: Vitamin B-12: 459 pg/mL (ref 180–914)

## 2024-08-24 LAB — FOLATE: Folate: 3.7 ng/mL — ABNORMAL LOW (ref >5.9)

## 2024-08-24 MED ORDER — HYDROCORTISONE (PERIANAL) 2.5 % EX CREA: 1.0000 | TOPICAL_CREAM | Freq: Two times a day (BID) | CUTANEOUS | 0 refills | Status: DC

## 2024-08-24 NOTE — ED Notes (Signed)
 Pt provided with discharge and follow up instructions, medications discussed, pt verbalized understanding. LDA removed, VSS, pt ambulatory out of ED w/ all paperwork and belongings in NAD.

## 2024-08-27 ENCOUNTER — Observation Stay (HOSPITAL_COMMUNITY)

## 2024-08-27 ENCOUNTER — Other Ambulatory Visit: Payer: Self-pay

## 2024-08-27 ENCOUNTER — Encounter (HOSPITAL_COMMUNITY): Payer: Self-pay

## 2024-08-27 ENCOUNTER — Observation Stay (HOSPITAL_COMMUNITY)
Admission: EM | Admit: 2024-08-27 | Discharge: 2024-09-06 | DRG: 094 | Disposition: A | Discharge status: Skilled Nursing Facility | Attending: Emergency Medicine | Admitting: Emergency Medicine

## 2024-08-27 DIAGNOSIS — Z720 Tobacco use: Secondary | ICD-10-CM | POA: Diagnosis present

## 2024-08-27 DIAGNOSIS — E876 Hypokalemia: Secondary | ICD-10-CM | POA: Diagnosis not present

## 2024-08-27 DIAGNOSIS — R531 Weakness: Secondary | ICD-10-CM | POA: Diagnosis not present

## 2024-08-27 DIAGNOSIS — Z8679 Personal history of other diseases of the circulatory system: Secondary | ICD-10-CM

## 2024-08-27 LAB — URINALYSIS, ROUTINE W REFLEX MICROSCOPIC
Bilirubin Urine: NEGATIVE
Glucose, UA: NEGATIVE mg/dL
Hgb urine dipstick: NEGATIVE
Ketones, ur: 20 mg/dL — AB
Nitrite: NEGATIVE
Protein, ur: 30 mg/dL — AB
Specific Gravity, Urine: 1.013 (ref 1.005–1.030)
pH: 7 (ref 5.0–8.0)

## 2024-08-27 LAB — COMPREHENSIVE METABOLIC PANEL WITH GFR
ALT: 23 U/L (ref 0–44)
AST: 81 U/L — ABNORMAL HIGH (ref 15–41)
Albumin: 2.8 g/dL — ABNORMAL LOW (ref 3.5–5.0)
Alkaline Phosphatase: 95 U/L (ref 38–126)
Anion gap: 15 (ref 5–15)
BUN: 5 mg/dL — ABNORMAL LOW (ref 8–23)
CO2: 23 mmol/L (ref 22–32)
Calcium: 8.5 mg/dL — ABNORMAL LOW (ref 8.9–10.3)
Chloride: 96 mmol/L — ABNORMAL LOW (ref 98–111)
Creatinine, Ser: 0.59 mg/dL (ref 0.44–1.00)
GFR, Estimated: >60 mL/min (ref >60)
Glucose, Bld: 91 mg/dL (ref 70–99)
Potassium: 3.3 mmol/L — ABNORMAL LOW (ref 3.5–5.1)
Sodium: 134 mmol/L — ABNORMAL LOW (ref 135–145)
Total Bilirubin: 0.9 mg/dL (ref 0.0–1.2)
Total Protein: 6.8 g/dL (ref 6.5–8.1)

## 2024-08-27 LAB — CBC
HCT: 47.4 % — ABNORMAL HIGH (ref 36.0–46.0)
Hemoglobin: 15.2 g/dL — ABNORMAL HIGH (ref 12.0–15.0)
MCH: 34.5 pg — ABNORMAL HIGH (ref 26.0–34.0)
MCHC: 32.1 g/dL (ref 30.0–36.0)
MCV: 107.7 fL — ABNORMAL HIGH (ref 80.0–100.0)
Platelets: 215 K/uL (ref 150–400)
RBC: 4.4 MIL/uL (ref 3.87–5.11)
RDW: 13.4 % (ref 11.5–15.5)
WBC: 7 K/uL (ref 4.0–10.5)
nRBC: 0 % (ref 0.0–0.2)

## 2024-08-27 LAB — MAGNESIUM: Magnesium: 1.5 mg/dL — ABNORMAL LOW (ref 1.7–2.4)

## 2024-08-27 LAB — RAPID URINE DRUG SCREEN, HOSP PERFORMED
Amphetamines: NOT DETECTED
Barbiturates: NOT DETECTED
Benzodiazepines: NOT DETECTED
Cocaine: NOT DETECTED
Opiates: NOT DETECTED
Tetrahydrocannabinol: POSITIVE — AB

## 2024-08-27 LAB — ETHANOL: Alcohol, Ethyl (B): <15 mg/dL (ref <15)

## 2024-08-27 MED ORDER — FOLIC ACID 1 MG PO TABS
1.0000 mg | ORAL_TABLET | Freq: Once | ORAL | Status: AC
  Administered 2024-08-27: 1 mg via ORAL
  Filled 2024-08-27: qty 1

## 2024-08-27 MED ORDER — LORAZEPAM 2 MG/ML IJ SOLN
2.0000 mg | Freq: Once | INTRAMUSCULAR | Status: AC | PRN
  Administered 2024-08-27: 2 mg via INTRAVENOUS
  Filled 2024-08-27: qty 1

## 2024-08-27 MED ORDER — KETOROLAC TROMETHAMINE 15 MG/ML IJ SOLN
15.0000 mg | Freq: Once | INTRAMUSCULAR | Status: AC
  Administered 2024-08-27: 15 mg via INTRAVENOUS
  Filled 2024-08-27: qty 1

## 2024-08-27 MED ORDER — LACTATED RINGERS IV BOLUS
1000.0000 mL | Freq: Once | INTRAVENOUS | Status: AC
  Administered 2024-08-27: 1000 mL via INTRAVENOUS

## 2024-08-27 MED ORDER — MAGNESIUM SULFATE 2 GM/50ML IV SOLN
2.0000 g | Freq: Once | INTRAVENOUS | Status: AC
  Administered 2024-08-27: 2 g via INTRAVENOUS
  Filled 2024-08-27: qty 50

## 2024-08-27 MED ORDER — POTASSIUM CHLORIDE CRYS ER 20 MEQ PO TBCR
40.0000 meq | EXTENDED_RELEASE_TABLET | Freq: Once | ORAL | Status: AC
  Administered 2024-08-27: 40 meq via ORAL
  Filled 2024-08-27: qty 2

## 2024-08-27 NOTE — Consult Note (Signed)
 NEUROLOGY CONSULT NOTE   Date of service: August 27, 2024 Patient Name: Deanna Goodwin MRN:  [id number] DOB:  10-Aug-1961 Chief Complaint: Progressive ascending limb weakness and sensory loss Requesting Provider: Freddi Hamilton, MD  History of Present Illness  Deanna Goodwin is a 63 y.o. female with a PMHx of HTN, seizures and kidney stones who presents to the ED with a 3 week history of progressive weakness and numbness that began in her hands and feet. The numbness manifests as a burning and tingling sensation, worse distally than proximally. The numbness began in her feet and hands simultaneously, then spread proximally up her limbs and is now also involving her neck and the bottom half of her face. She endorses some difficulty swallowing. No double vision or aphasia. She has had some confusion. Today is her 3rd presentation to the ED. On Thanksgiving she presented to the ED (second visit) with difficulty ambulating. At that time she was only able to make small, shuffling steps. Since then, her ambulation has further worsened to the point that she is no longer able to ambulate. She started wearing Depends one day ago, due to inability to make it to the bathroom in time, but has not had intrinsic urinary or bowel incontinence. Did have N/V for 3 days right around the time her symptoms started. Denies SOB, but endorses that her skin has turned blue. No CP, recent vaccination or recent infection.   ROS  Comprehensive ROS performed and pertinent positives documented in HPI    Past History   Past Medical History:  Diagnosis Date   Hypertension    Kidney stones    Seizures (HCC)     Past Surgical History:  Procedure Laterality Date   CYSTOSCOPY WITH RETROGRADE PYELOGRAM, URETEROSCOPY AND STENT PLACEMENT Right 06/26/2014   Procedure: CYSTOSCOPY WITH STENT PLACEMENT;  Surgeon: Gretel Ferrara, MD;  Location: [facility];  Service: Urology;  Laterality: Right;   KIDNEY STONE SURGERY Left 2013/2015    LITHOTRIPSY      Family History: History reviewed. No pertinent family history.  Social History  reports that she has been smoking cigarettes. She has a 18 pack-year smoking history. She has never used smokeless tobacco. She reports current alcohol use. She reports that she does not use drugs.  No Known Allergies  Medications   Current Facility-Administered Medications:    folic acid (FOLVITE) tablet 1 mg, 1 mg, Oral, Once, Freddi Hamilton, MD   lactated ringers  bolus 1,000 mL, 1,000 mL, Intravenous, Once, Freddi Hamilton, MD  Current Outpatient Medications:    acetaminophen  (TYLENOL ) 325 MG tablet, Take 650 mg by mouth every 6 (six) hours as needed for moderate pain. (Patient not taking: Reported on 09/22/2023), Disp: , Rfl:    amoxicillin -clavulanate (AUGMENTIN ) 875-125 MG tablet, Take 1 tablet by mouth every 12 (twelve) hours. (Patient not taking: Reported on 09/22/2023), Disp: 14 tablet, Rfl: 0   chlorhexidine  (PERIDEX ) 0.12 % solution, Use as directed 15 mLs in the mouth or throat 2 (two) times daily. (Patient not taking: Reported on 09/22/2023), Disp: 240 mL, Rfl: 0   clindamycin  (CLEOCIN ) 150 MG capsule, Take 1 capsule (150 mg total) by mouth 3 (three) times daily. (Patient not taking: Reported on 09/22/2023), Disp: 30 capsule, Rfl: 0   HYDROcodone -acetaminophen  (NORCO/VICODIN) 5-325 MG tablet, Take 1 tablet by mouth every 4 (four) hours as needed. (Patient not taking: Reported on 09/22/2023), Disp: 10 tablet, Rfl: 0   hydrocortisone  (ANUSOL -HC) 2.5 % rectal cream, Place 1 Application rectally 2 (two) times  daily., Disp: 30 g, Rfl: 0   hydrocortisone  (ANUSOL -HC) 25 MG suppository, Place 1 suppository (25 mg total) rectally 2 (two) times daily., Disp: 12 suppository, Rfl: 0   ibuprofen  (ADVIL ) 800 MG tablet, Take 1 tablet (800 mg total) by mouth 3 (three) times daily. (Patient not taking: Reported on 09/22/2023), Disp: 21 tablet, Rfl: 0   nystatin  cream (MYCOSTATIN ), Apply to  affected area 2 times daily, Disp: 30 g, Rfl: 0   ondansetron  (ZOFRAN ) 4 MG tablet, Take 1 tablet (4 mg total) by mouth every 6 (six) hours as needed for nausea or vomiting. (Patient not taking: Reported on 09/22/2023), Disp: 20 tablet, Rfl: 0  Vitals   Vitals:   08/27/24 1836 08/27/24 1837 08/27/24 1838 08/27/24 2151  BP: (!) 143/70   (!) 146/79  Pulse: 90   72  Resp: 16   17  Temp: 98.3 F (36.8 C)   97.8 F (36.6 C)  TempSrc: Oral   Oral  SpO2: 99% 98%  100%  Weight:   95 kg   Height:   5' 3 (1.6 m)     Body mass index is 37.1 kg/m.   Physical Exam   Constitutional: Appears well-developed and well-nourished.  Psych: Dysthymic affect Eyes: No scleral injection.  HENT: No OP obstruction.  Head: Normocephalic.  Respiratory: Effort normal, non-labored breathing. No shortness of breath noted.  Skin: Distal upper and lower extremities are cool to touch. No edema.   Neurologic Examination   Mental Status: Alert, oriented x 5, thought content appropriate.  Speech fluent with intact naming and comprehension. Equivocal subtle dysarthria. Able to follow all commands without difficulty. Cranial Nerves: II: Temporal visual fields intact with no extinction to DSS. PERRL  III,IV, VI: No ptosis. EOMI. No nystagmus. Mild esotropia becomes apparent transiently with upgaze.  V: Temp sensation equal bilaterally  VII: Smile symmetric VIII: Hearing intact to voice IX,X: No hoarseness XI: Symmetric shoulder shrug XII: Midline tongue extension Motor: BUE 3/5 deltoids, 4-/5 triceps, biceps and grip BLE 2/5 hip flexion, 3/5 knee extension, knee flexion and ADF/APF  Mildly decreased tone x 4. Bulk normal x 4.  Sensory: Temp sensation is impaired in a proximal to distal gradient in BUE and BLE, worse in the feet than the hands. Fine touch also impaired in a proximal to distal gradient, worse distally.   Deep Tendon Reflexes: Biceps, triceps, brachjoradialis, patellar and achilles reflexes  tested bilaterally. All are absent except for left brachioradialis, which is 1+.  Toes downgoing bilaterally.  Cerebellar: No ataxia with FNF bilaterally. Unable to test H-S due to weakness.   Gait: Deferred   Labs/Imaging/Neurodiagnostic studies   CBC:  Recent Labs  Lab 09-16-24 1701 08/27/24 1854  WBC 6.8 7.0  NEUTROABS 4.9  --   HGB 14.9 15.2*  HCT 43.0 47.4*  MCV 98.9 107.7*  PLT 276 215   Basic Metabolic Panel:  Lab Results  Component Value Date   NA 134 (L) 08/27/2024   K 3.3 (L) 08/27/2024   CO2 23 08/27/2024   GLUCOSE 91 08/27/2024   BUN 5 (L) 08/27/2024   CREATININE 0.59 08/27/2024   CALCIUM  8.5 (L) 08/27/2024   GFRNONAA >60 08/27/2024   GFRAA >60 10/16/2017   Lipid Panel:  Lab Results  Component Value Date   LDLCALC 87 12/20/2016   HgbA1c: No results found for: HGBA1C Urine Drug Screen:     Component Value Date/Time   LABOPIA NONE DETECTED 08/27/2024 2014   COCAINSCRNUR NONE DETECTED 08/27/2024 2014  LABBENZ NONE DETECTED 08/27/2024 2014   AMPHETMU NONE DETECTED 08/27/2024 2014   THCU POSITIVE (A) 08/27/2024 2014   LABBARB NONE DETECTED 08/27/2024 2014    Alcohol Level     Component Value Date/Time   [facility] <15 08/23/2024 1701     ASSESSMENT  Jolayne E Higinbotham is a 63 y.o. female with a PMHx of HTN, seizures and kidney stones who presents to the ED with a 3 week history of progressive weakness and numbness that began in her hands and feet.  - Exam reveals areflexia, diffuse limb weakness and dysarthria.  - MRI brain (11/27): No acute intracranial abnormality. 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  - Labs with mild hypokalemia of 3.4, normal Na, normal Mg, low Ca but also has low albumin. AST elevated at 68. BUN low at 5. Cr normal. WBC normal. Glucose 106. Recent folate level was low at 3.7.  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     RECOMMENDATIONS  - Will need fluoro guided LP due to high BMI. Discussed labs with EDP.  - MRI of cervical spine w/wo contrast - If LP reveals albuminocytologic dissociation, will need to treat with IVIG or PLEX.  - Respiratory therapy consult for NIF/FVC - Frequent neuro checks.  - Cardiac telemetry and pulse oximetry - PT/OT/Speech - NPO until passes swallow evaluation - IVF - Folate supplementation - Thiamine, B12 and copper levels.   Addendum: MRI C-spine w/wo contrast: No acute findings.  ______________________________________________________________________    Bonney SHARK, Gery Sabedra, MD [facility]

## 2024-08-27 NOTE — ED Triage Notes (Signed)
 Patient bib [facility] from home with continued weakness in both legs and tingling and numbness in all extremities.  Was seen here for the same thing and discharged on the 28th. Has been having multiple falls at home and family is unable to take care of her. Her neurology appointment is tomorrow

## 2024-08-27 NOTE — H&P (Signed)
 History and Physical      Deanna Goodwin FMW:[id number] DOB: 1961/07/05 DOA: 08/27/2024; DOS: 08/27/2024  PCP: Tanda Bleacher, MD *** Patient coming from: home ***  I have personally briefly reviewed patient's old medical records in [facility]  Chief Complaint: ***  HPI: Deanna Goodwin is a 63 y.o. female with medical history significant for *** who is admitted to [facility] on 08/27/2024 with *** after presenting from home*** to [facility] ED complaining of ***.    ***       ***   ED Course:  Vital signs in the ED were notable for the following: ***  Labs were notable for the following: ***  Per my interpretation, EKG in ED demonstrated the following:  ***  Imaging in the ED, per corresponding formal radiology read, was notable for the following:  ***  While in the ED, the following were administered: ***  Subsequently, the patient was admitted  ***  ***red    Review of Systems: As per HPI otherwise 10 point review of systems negative.   Past Medical History:  Diagnosis Date   Hypertension    Kidney stones    Seizures (HCC)     Past Surgical History:  Procedure Laterality Date   CYSTOSCOPY WITH RETROGRADE PYELOGRAM, URETEROSCOPY AND STENT PLACEMENT Right 06/26/2014   Procedure: CYSTOSCOPY WITH STENT PLACEMENT;  Surgeon: Gretel Ferrara, MD;  Location: [facility] ORS;  Service: Urology;  Laterality: Right;   KIDNEY STONE SURGERY Left 2013/2015   LITHOTRIPSY      Social History:  reports that she has been smoking cigarettes. She has a 18 pack-year smoking history. She has never used smokeless tobacco. She reports current alcohol use. She reports that she does not use drugs.   No Known Allergies  History reviewed. No pertinent family history.  Family history reviewed and not pertinent ***   Prior to Admission medications   Medication Sig Start Date End Date Taking? Authorizing Provider  acetaminophen  (TYLENOL ) 325 MG tablet Take 650 mg by mouth  every 6 (six) hours as needed for moderate pain. Patient not taking: Reported on 09/22/2023    [provider]  amoxicillin -clavulanate (AUGMENTIN ) 875-125 MG tablet Take 1 tablet by mouth every 12 (twelve) hours. Patient not taking: Reported on 09/22/2023 07/20/19   Babara Greig GAILS, PA-C  chlorhexidine  (PERIDEX ) 0.12 % solution Use as directed 15 mLs in the mouth or throat 2 (two) times daily. Patient not taking: Reported on 09/22/2023 07/20/19   Babara Greig GAILS, PA-C  clindamycin  (CLEOCIN ) 150 MG capsule Take 1 capsule (150 mg total) by mouth 3 (three) times daily. Patient not taking: Reported on 09/22/2023 07/21/19   Odis Burnard Jansky, PA-C  HYDROcodone -acetaminophen  (NORCO/VICODIN) 5-325 MG tablet Take 1 tablet by mouth every 4 (four) hours as needed. Patient not taking: Reported on 09/22/2023 07/21/19   Odis Burnard Jansky, PA-C  hydrocortisone  (ANUSOL -HC) 2.5 % rectal cream Place 1 Application rectally 2 (two) times daily. 08/24/24   Mesner, Selinda, MD  hydrocortisone  (ANUSOL -HC) 25 MG suppository Place 1 suppository (25 mg total) rectally 2 (two) times daily. 08/23/24   Towana Ozell BROCKS, MD  ibuprofen  (ADVIL ) 800 MG tablet Take 1 tablet (800 mg total) by mouth 3 (three) times daily. Patient not taking: Reported on 09/22/2023 07/20/19   Babara Greig GAILS, PA-C  nystatin  cream (MYCOSTATIN ) Apply to affected area 2 times daily 08/18/24   Arlee Katz, MD  ondansetron  (ZOFRAN ) 4 MG tablet Take 1 tablet (4 mg total) by mouth  every 6 (six) hours as needed for nausea or vomiting. Patient not taking: Reported on 09/22/2023 06/09/23   Jerral Meth, MD     Objective    Physical Exam: Vitals:   08/27/24 1836 08/27/24 1837 08/27/24 1838 08/27/24 2151  BP: (!) 143/70   (!) 146/79  Pulse: 90   72  Resp: 16   17  Temp: 98.3 F (36.8 C)   97.8 F (36.6 C)  TempSrc: Oral   Oral  SpO2: 99% 98%  100%  Weight:   95 kg   Height:   5' 3 (1.6 m)     General: appears to be stated age; alert,  oriented Skin: warm, dry, no rash Head:  AT/[place] Mouth:  Oral mucosa membranes appear moist, normal dentition Neck: supple; trachea midline Heart:  RRR; did not appreciate any M/R/G Lungs: CTAB, did not appreciate any wheezes, rales, or rhonchi Abdomen: + BS; soft, ND, NT Vascular: 2+ pedal pulses b/l; 2+ radial pulses b/l Extremities: no peripheral edema, no muscle wasting   ***   *** Neuro: strength and sensation intact in upper and lower extremities b/l  *** Neuro: 5/5 strength of the proximal and distal flexors and extensors of the upper and lower extremities bilaterally; sensation intact in upper and lower extremities b/l; cranial nerves II through XII grossly intact; no pronator drift; no evidence suggestive of slurred speech, dysarthria, or facial droop; Normal muscle tone. No tremors. *** Neuro: In the setting of the patient's current mental status and associated inability to follow instructions, unable to perform full neurologic exam at this time.  As such, assessment of strength, sensation, and cranial nerves is limited at this time. Patient noted to spontaneously move all 4 extremities. No tremors.  ***        Labs on Admission: I have personally reviewed following labs and imaging studies  CBC: Recent Labs  Lab 08/23/24 1701 08/27/24 1854  WBC 6.8 7.0  NEUTROABS 4.9  --   HGB 14.9 15.2*  HCT 43.0 47.4*  MCV 98.9 107.7*  PLT 276 215   Basic Metabolic Panel: Recent Labs  Lab 08/23/24 1701 08/27/24 1854 08/27/24 2100  NA 131* 134*  --   K 3.5 3.3*  --   CL 91* 96*  --   CO2 27 23  --   GLUCOSE 151* 91  --   BUN <5* 5*  --   CREATININE 0.68 0.59  --   CALCIUM  8.7* 8.5*  --   MG 1.4*  --  1.5*   GFR: Estimated Creatinine Clearance: 80.9 mL/min (by C-G formula based on SCr of 0.59 mg/dL). Liver Function Tests: Recent Labs  Lab 08/23/24 1701 08/27/24 1854  AST 77* 81*  ALT 23 23  ALKPHOS 95 95  BILITOT 0.8 0.9  PROT 6.9 6.8  ALBUMIN 3.1* 2.8*    Recent Labs  Lab 08/23/24 1701  LIPASE 38   No results for input(s): AMMONIA in the last 168 hours. Coagulation Profile: No results for input(s): INR, PROTIME in the last 168 hours. Cardiac Enzymes: No results for input(s): CKTOTAL, CKMB, CKMBINDEX, TROPONINI in the last 168 hours. BNP (last 3 results) No results for input(s): PROBNP in the last 8760 hours. HbA1C: No results for input(s): HGBA1C in the last 72 hours. CBG: No results for input(s): GLUCAP in the last 168 hours. Lipid Profile: No results for input(s): CHOL, HDL, LDLCALC, TRIG, CHOLHDL, LDLDIRECT in the last 72 hours. Thyroid Function Tests: No results for input(s): TSH, T4TOTAL, FREET4, T3FREE,  THYROIDAB in the last 72 hours. Anemia Panel: No results for input(s): VITAMINB12, FOLATE, FERRITIN, TIBC, IRON, RETICCTPCT in the last 72 hours. Urine analysis:    Component Value Date/Time   COLORURINE YELLOW 08/27/2024 2014   APPEARANCEUR CLOUDY (A) 08/27/2024 2014   LABSPEC 1.013 08/27/2024 2014   PHURINE 7.0 08/27/2024 2014   GLUCOSEU NEGATIVE 08/27/2024 2014   HGBUR NEGATIVE 08/27/2024 2014   BILIRUBINUR NEGATIVE 08/27/2024 2014   KETONESUR 20 (A) 08/27/2024 2014   PROTEINUR 30 (A) 08/27/2024 2014   UROBILINOGEN 1.0 06/26/2014 1105   NITRITE NEGATIVE 08/27/2024 2014   LEUKOCYTESUR TRACE (A) 08/27/2024 2014    Radiological Exams on Admission: No results found.    Assessment/Plan   Principal Problem:   Generalized  weakness   ***            ***                     ***                      ***                     ***                     ***                     ***                      ***                     ***                     ***                     ***                     ***                    ***                   ***  DVT prophylaxis: SCD's ***  Code Status: Full code*** Family Communication: none*** Disposition Plan: Per Rounding Team Consults called: none***;  Admission status: ***     I SPENT GREATER THAN 75 *** MINUTES IN CLINICAL CARE TIME/MEDICAL DECISION-MAKING IN COMPLETING THIS ADMISSION.      Eva NOVAK Kevionna Heffler DO [facility]  From 7PM - 7AM   08/27/2024, 11:56 PM   ***

## 2024-08-27 NOTE — ED Provider Notes (Signed)
  Provider Note MRN:  [id number]  Arrival date & time: 08/27/24    ED Course and Medical Decision Making  Assumed care of patient at sign-out or upon transfer.  Progressive weakness largely affecting the upper extremities over the past week.  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  Plan is for fluoroscopy guided LP, MRI, evaluation by respiratory therapy, admission.  11:50 PM update: Accepted for admission by hospitalist.  Procedures  Final Clinical Impressions(s) / ED Diagnoses     ICD-10-CM   1. Weakness  R53.1       ED Discharge Orders     None       Discharge Instructions   None     Ozell HERO. Theadore, MD [facility] Emergency [facility] [email or web address]    Theadore Ozell HERO, MD 08/27/24 2352

## 2024-08-27 NOTE — ED Provider Notes (Signed)
 [place] EMERGENCY DEPARTMENT AT [facility] Provider Note   CSN: [id number] Arrival date & time: 08/27/24  8170     Patient presents with: No chief complaint on file.   Deanna Goodwin is a 63 y.o. female.   HPI 63 year old female presents with weakness and numbness.  The symptoms have been getting progressively worse.  Started off about a week ago according to her.  Was seen here on 11/27 and the symptoms have been getting worse since discharge.  She denies any fevers, cough, chest pain, shortness of breath, headache or neck or back pain.  The numbness and weakness started in both legs from the knees down in both arms from the elbows down but now is up to her thighs and upper arms.  She feels like her entire neck is numb as well as part of her face.  She states that since Thanksgiving when she was discharged she has been unable to walk and has been having to use an office chair to get around.  She had a relatively recent diagnosis of pancreatitis but no abdominal pain or vomiting currently.  She states she drinks occasional alcohol but not every day and not heavily.  Prior to Admission medications   Medication Sig Start Date End Date Taking? Authorizing Provider  acetaminophen  (TYLENOL ) 325 MG tablet Take 650 mg by mouth every 6 (six) hours as needed for moderate pain. Patient not taking: Reported on 09/22/2023    [provider]  amoxicillin -clavulanate (AUGMENTIN ) 875-125 MG tablet Take 1 tablet by mouth every 12 (twelve) hours. Patient not taking: Reported on 09/22/2023 07/20/19   Babara Greig GAILS, PA-C  chlorhexidine  (PERIDEX ) 0.12 % solution Use as directed 15 mLs in the mouth or throat 2 (two) times daily. Patient not taking: Reported on 09/22/2023 07/20/19   Babara Greig GAILS, PA-C  clindamycin  (CLEOCIN ) 150 MG capsule Take 1 capsule (150 mg total) by mouth 3 (three) times daily. Patient not taking: Reported on 09/22/2023 07/21/19   Odis Burnard Jansky, PA-C   HYDROcodone -acetaminophen  (NORCO/VICODIN) 5-325 MG tablet Take 1 tablet by mouth every 4 (four) hours as needed. Patient not taking: Reported on 09/22/2023 07/21/19   Odis Burnard Jansky, PA-C  hydrocortisone  (ANUSOL -HC) 2.5 % rectal cream Place 1 Application rectally 2 (two) times daily. 08/24/24   Mesner, Selinda, MD  hydrocortisone  (ANUSOL -HC) 25 MG suppository Place 1 suppository (25 mg total) rectally 2 (two) times daily. 08/23/24   Towana Ozell BROCKS, MD  ibuprofen  (ADVIL ) 800 MG tablet Take 1 tablet (800 mg total) by mouth 3 (three) times daily. Patient not taking: Reported on 09/22/2023 07/20/19   Babara Greig GAILS, PA-C  nystatin  cream (MYCOSTATIN ) Apply to affected area 2 times daily 08/18/24   Arlee Katz, MD  ondansetron  (ZOFRAN ) 4 MG tablet Take 1 tablet (4 mg total) by mouth every 6 (six) hours as needed for nausea or vomiting. Patient not taking: Reported on 09/22/2023 06/09/23   Jerral Meth, MD    Allergies: Patient has no known allergies.    Review of Systems  Constitutional:  Negative for fever.  Respiratory:  Negative for cough and shortness of breath.   Cardiovascular:  Negative for chest pain.  Gastrointestinal:  Negative for abdominal pain.  Musculoskeletal:  Negative for back pain and neck pain.  Neurological:  Positive for weakness and numbness. Negative for headaches.    Updated Vital Signs BP (!) 146/79 (BP Location: Right Arm)   Pulse 72   Temp 97.8 F (36.6 C) (Oral)  Resp 17   Ht 5' 3 (1.6 m)   Wt 95 kg   LMP 07/01/2014   SpO2 100%   BMI 37.10 kg/m   Physical Exam Vitals and nursing note reviewed.  Constitutional:      General: She is not in acute distress.    Appearance: She is well-developed. She is not ill-appearing or diaphoretic.  HENT:     Head: Normocephalic and atraumatic.  Eyes:     Extraocular Movements: Extraocular movements intact.  Cardiovascular:     Rate and Rhythm: Normal rate and regular rhythm.     Pulses:           Radial pulses are 2+ on the right side and 2+ on the left side.       Dorsalis pedis pulses are 2+ on the right side and 2+ on the left side.     Heart sounds: Normal heart sounds.  Pulmonary:     Effort: Pulmonary effort is normal.     Breath sounds: Normal breath sounds.  Abdominal:     Palpations: Abdomen is soft.     Tenderness: There is no abdominal tenderness.  Musculoskeletal:     Cervical back: No rigidity.  Skin:    General: Skin is warm and dry.  Neurological:     Mental Status: She is alert.     Comments: Awake, alert, no slurred speech. No facial droop. Patient has diffuse poor effort and symmetric weakness of all 4 extremities. She can do finger to nose bilaterally but very slowly. She has intact sensation in both hands/feet.     (all labs ordered are listed, but only abnormal results are displayed) Labs Reviewed  COMPREHENSIVE METABOLIC PANEL WITH GFR - Abnormal; Notable for the following components:      Result Value   Sodium 134 (*)    Potassium 3.3 (*)    Chloride 96 (*)    BUN 5 (*)    Calcium  8.5 (*)    Albumin 2.8 (*)    AST 81 (*)    All other components within normal limits  CBC - Abnormal; Notable for the following components:   Hemoglobin 15.2 (*)    HCT 47.4 (*)    MCV 107.7 (*)    MCH 34.5 (*)    All other components within normal limits  MAGNESIUM  - Abnormal; Notable for the following components:   Magnesium  1.5 (*)    All other components within normal limits  URINALYSIS, ROUTINE W REFLEX MICROSCOPIC - Abnormal; Notable for the following components:   APPearance CLOUDY (*)    Ketones, ur 20 (*)    Protein, ur 30 (*)    Leukocytes,Ua TRACE (*)    Bacteria, UA RARE (*)    All other components within normal limits  RAPID URINE DRUG SCREEN, HOSP PERFORMED - Abnormal; Notable for the following components:   Tetrahydrocannabinol POSITIVE (*)    All other components within normal limits  ETHANOL  VITAMIN B1  IGG CSF INDEX  DRAW EXTRA CLOT TUBE     EKG: EKG Interpretation Date/Time:  Monday August 27 2024 19:59:23 EST Ventricular Rate:  75 PR Interval:  160 QRS Duration:  89 QT Interval:  397 QTC Calculation: 444 R Axis:   -37  Text Interpretation: Sinus rhythm Left axis deviation Borderline low voltage, extremity leads Abnormal R-wave progression, late transition Confirmed by Freddi Hamilton [phone]) on 08/27/2024 8:11:00 PM  Radiology: No results found.   Procedures   Medications Ordered in the ED  magnesium  sulfate IVPB 2 g 50 mL (2 g Intravenous New Bag/Given 08/27/24 2331)  folic acid (FOLVITE) tablet 1 mg (1 mg Oral Given 08/27/24 2324)  lactated ringers  bolus 1,000 mL (1,000 mLs Intravenous New Bag/Given 08/27/24 2258)  potassium chloride  SA (KLOR-CON  M) CR tablet 40 mEq (40 mEq Oral Given 08/27/24 2324)                                    Medical Decision Making Amount and/or Complexity of Data Reviewed External Data Reviewed: notes. Labs: ordered.    Details: Mild hypomagnesemia, mild hypokalemia ECG/medicine tests: ordered and independent interpretation performed.    Details: No ischemia  Risk Prescription drug management.   Patient presents with progressive weakness.  She is breathing well.  She had a negative MRI on her last visit.  She reports she can no longer walk.  Neurology was consulted, Dr. Lindzen will see patient and neurorecommendations pending. Care transferred to Dr. Theadore.     Final diagnoses:  None    ED Discharge Orders     None          Freddi Hamilton, MD 08/27/24 2336

## 2024-08-28 ENCOUNTER — Observation Stay (HOSPITAL_COMMUNITY)

## 2024-08-28 ENCOUNTER — Encounter (HOSPITAL_COMMUNITY): Payer: Self-pay | Admitting: Internal Medicine

## 2024-08-28 DIAGNOSIS — Z720 Tobacco use: Secondary | ICD-10-CM | POA: Diagnosis not present

## 2024-08-28 DIAGNOSIS — E876 Hypokalemia: Secondary | ICD-10-CM | POA: Diagnosis not present

## 2024-08-28 DIAGNOSIS — Z8679 Personal history of other diseases of the circulatory system: Secondary | ICD-10-CM | POA: Diagnosis not present

## 2024-08-28 DIAGNOSIS — R531 Weakness: Secondary | ICD-10-CM | POA: Diagnosis not present

## 2024-08-28 LAB — CBC WITH DIFFERENTIAL/PLATELET
Abs Immature Granulocytes: 0.01 K/uL (ref 0.00–0.07)
Basophils Absolute: 0 K/uL (ref 0.0–0.1)
Basophils Relative: 0 %
Eosinophils Absolute: 0 K/uL (ref 0.0–0.5)
Eosinophils Relative: 1 %
HCT: 40 % (ref 36.0–46.0)
Hemoglobin: 13.4 g/dL (ref 12.0–15.0)
Immature Granulocytes: 0 %
Lymphocytes Relative: 16 %
Lymphs Abs: 1.1 K/uL (ref 0.7–4.0)
MCH: 34.2 pg — ABNORMAL HIGH (ref 26.0–34.0)
MCHC: 33.5 g/dL (ref 30.0–36.0)
MCV: 102 fL — ABNORMAL HIGH (ref 80.0–100.0)
Monocytes Absolute: 0.7 K/uL (ref 0.1–1.0)
Monocytes Relative: 10 %
Neutro Abs: 5.3 K/uL (ref 1.7–7.7)
Neutrophils Relative %: 73 %
Platelets: 196 K/uL (ref 150–400)
RBC: 3.92 MIL/uL (ref 3.87–5.11)
RDW: 13.4 % (ref 11.5–15.5)
WBC: 7.1 K/uL (ref 4.0–10.5)
nRBC: 0 % (ref 0.0–0.2)

## 2024-08-28 LAB — MENINGITIS/ENCEPHALITIS PANEL (CSF)

## 2024-08-28 LAB — MAGNESIUM: Magnesium: 2.1 mg/dL (ref 1.7–2.4)

## 2024-08-28 LAB — COMPREHENSIVE METABOLIC PANEL WITH GFR
ALT: 20 U/L (ref 0–44)
AST: 68 U/L — ABNORMAL HIGH (ref 15–41)
Albumin: 2.5 g/dL — ABNORMAL LOW (ref 3.5–5.0)
Alkaline Phosphatase: 88 U/L (ref 38–126)
Anion gap: 18 — ABNORMAL HIGH (ref 5–15)
BUN: 5 mg/dL — ABNORMAL LOW (ref 8–23)
CO2: 25 mmol/L (ref 22–32)
Calcium: 8.4 mg/dL — ABNORMAL LOW (ref 8.9–10.3)
Chloride: 93 mmol/L — ABNORMAL LOW (ref 98–111)
Creatinine, Ser: 0.59 mg/dL (ref 0.44–1.00)
GFR, Estimated: >60 mL/min (ref >60)
Glucose, Bld: 106 mg/dL — ABNORMAL HIGH (ref 70–99)
Potassium: 3.4 mmol/L — ABNORMAL LOW (ref 3.5–5.1)
Sodium: 136 mmol/L (ref 135–145)
Total Bilirubin: 0.9 mg/dL (ref 0.0–1.2)
Total Protein: 5.8 g/dL — ABNORMAL LOW (ref 6.5–8.1)

## 2024-08-28 LAB — MRSA NEXT GEN BY PCR, NASAL: MRSA by PCR Next Gen: NOT DETECTED

## 2024-08-28 LAB — CSF CELL COUNT WITH DIFFERENTIAL
Other Cells, CSF: NONE SEEN
RBC Count, CSF: 1 /mm3 — ABNORMAL HIGH
Tube #: 3
WBC, CSF: 2 /mm3 (ref 0–5)

## 2024-08-28 LAB — PROTEIN AND GLUCOSE, CSF
Glucose, CSF: 68 mg/dL (ref 40–70)
Total  Protein, CSF: 64 mg/dL — ABNORMAL HIGH (ref 15–45)

## 2024-08-28 MED ORDER — ACETAMINOPHEN 325 MG PO TABS
650.0000 mg | ORAL_TABLET | Freq: Four times a day (QID) | ORAL | Status: DC | PRN
  Administered 2024-08-28 – 2024-09-05 (×15): 650 mg via ORAL
  Filled 2024-08-28 (×15): qty 2

## 2024-08-28 MED ORDER — LIDOCAINE 1 % OPTIME INJ - NO CHARGE
5.0000 mL | Freq: Once | INTRAMUSCULAR | Status: DC
  Filled 2024-08-28: qty 6

## 2024-08-28 MED ORDER — POTASSIUM CHLORIDE CRYS ER 20 MEQ PO TBCR
40.0000 meq | EXTENDED_RELEASE_TABLET | Freq: Two times a day (BID) | ORAL | Status: AC
  Administered 2024-08-28 (×2): 40 meq via ORAL
  Filled 2024-08-28 (×2): qty 2

## 2024-08-28 MED ORDER — POLYETHYLENE GLYCOL 3350 17 G PO PACK
17.0000 g | PACK | Freq: Two times a day (BID) | ORAL | Status: DC
  Administered 2024-08-28 – 2024-09-02 (×8): 17 g via ORAL
  Filled 2024-08-28 (×11): qty 1

## 2024-08-28 MED ORDER — GADOBUTROL 1 MMOL/ML IV SOLN
10.0000 mL | Freq: Once | INTRAVENOUS | Status: AC | PRN
  Administered 2024-08-28: 10 mL via INTRAVENOUS

## 2024-08-28 MED ORDER — THIAMINE HCL 100 MG/ML IJ SOLN: 100.0000 mg | Freq: Every day | INTRAMUSCULAR | Status: DC

## 2024-08-28 MED ORDER — THIAMINE HCL 100 MG/ML IJ SOLN
500.0000 mg | Freq: Three times a day (TID) | INTRAVENOUS | Status: AC
  Administered 2024-08-28 – 2024-08-29 (×6): 500 mg via INTRAVENOUS
  Filled 2024-08-28: qty 500
  Filled 2024-08-28 (×4): qty 5
  Filled 2024-08-28 (×3): qty 500

## 2024-08-28 MED ORDER — ONDANSETRON HCL 4 MG/2ML IJ SOLN: 4.0000 mg | Freq: Four times a day (QID) | INTRAMUSCULAR | Status: DC | PRN

## 2024-08-28 MED ORDER — ACETAMINOPHEN 650 MG RE SUPP: 650.0000 mg | Freq: Four times a day (QID) | RECTAL | Status: DC | PRN

## 2024-08-28 MED ORDER — SENNOSIDES-DOCUSATE SODIUM 8.6-50 MG PO TABS
1.0000 | ORAL_TABLET | Freq: Two times a day (BID) | ORAL | Status: DC
  Administered 2024-08-28 – 2024-09-03 (×10): 1 via ORAL
  Filled 2024-08-28 (×11): qty 1

## 2024-08-28 MED ORDER — SODIUM CHLORIDE 0.9 % IV SOLN: INTRAVENOUS | Status: DC

## 2024-08-28 MED ORDER — NICOTINE POLACRILEX 2 MG MT GUM: 2.0000 mg | CHEWING_GUM | OROMUCOSAL | Status: DC | PRN

## 2024-08-28 MED ORDER — IMMUNE GLOBULIN (HUMAN) 10 GM/100ML IV SOLN
400.0000 mg/kg | INTRAVENOUS | Status: AC
  Administered 2024-08-28 – 2024-09-01 (×5): 25 g via INTRAVENOUS
  Filled 2024-08-28 (×6): qty 250

## 2024-08-28 MED ORDER — NICOTINE 14 MG/24HR TD PT24: 14.0000 mg | MEDICATED_PATCH | Freq: Every day | TRANSDERMAL | Status: DC | PRN

## 2024-08-28 MED ORDER — TRAMADOL HCL 50 MG PO TABS: 50.0000 mg | ORAL_TABLET | Freq: Four times a day (QID) | ORAL | Status: DC | PRN

## 2024-08-28 MED ORDER — MELATONIN 3 MG PO TABS
3.0000 mg | ORAL_TABLET | Freq: Every evening | ORAL | Status: DC | PRN
  Administered 2024-08-28 – 2024-09-05 (×7): 3 mg via ORAL
  Filled 2024-08-28 (×7): qty 1

## 2024-08-28 MED ORDER — THIAMINE HCL 100 MG/ML IJ SOLN
250.0000 mg | Freq: Every day | INTRAVENOUS | Status: AC
  Administered 2024-08-30 – 2024-09-04 (×6): 250 mg via INTRAVENOUS
  Filled 2024-08-28 (×6): qty 2.5

## 2024-08-28 MED ORDER — FOLIC ACID 1 MG PO TABS
1.0000 mg | ORAL_TABLET | Freq: Every day | ORAL | Status: DC
  Administered 2024-08-28 – 2024-09-06 (×10): 1 mg via ORAL
  Filled 2024-08-28 (×10): qty 1

## 2024-08-28 MED ORDER — IBUPROFEN 200 MG PO TABS
600.0000 mg | ORAL_TABLET | Freq: Four times a day (QID) | ORAL | Status: DC | PRN
  Administered 2024-08-28 – 2024-09-04 (×13): 600 mg via ORAL
  Filled 2024-08-28 (×13): qty 3

## 2024-08-28 NOTE — Evaluation (Signed)
 Occupational Therapy Evaluation Patient Details Name: Deanna Goodwin MRN: [id number] DOB: Apr 04, 1961 Today's Date: 08/28/2024   History of Present Illness   Pt is a 63 y.o. female presenting 12/1 with weakness. Found to have hypokalemia, hypomagnesia.     PMH: HTN, neuropathy.     Clinical Impressions PTA, pt lived with family and was independent in ADL, IADL, working, and driving until onset weakness. Upon eval, with generalized weakness, decreased balance, safety, blurred vision, and STM. Pt scored a 2 on the Short Blessed test of Cognition indicating WFL basic cognitive function but did display mild memory deficits and inconsistent history provided events this morning (whether she attempted to stand with PT). Due to significant decline in functional status, recommending intensive multidisciplinary rehabilitation >3 hours/day to optimize safety and independence in ADL.       If plan is discharge home, recommend the following:   Two people to help with walking and/or transfers;A lot of help with bathing/dressing/bathroom;Assistance with cooking/housework;Assist for transportation;Help with stairs or ramp for entrance     Functional Status Assessment   Patient has had a recent decline in their functional status and demonstrates the ability to make significant improvements in function in a reasonable and predictable amount of time.     Equipment Recommendations   Other (comment) (defer)     Recommendations for Other Services   Rehab consult;Speech consult     Precautions/Restrictions   Precautions Precautions: Fall Restrictions Weight Bearing Restrictions Per Provider Order: No     Mobility Bed Mobility Overal bed mobility: Needs Assistance Bed Mobility: Rolling, Sidelying to Sit, Sit to Sidelying Rolling: Min assist, Used rails Sidelying to sit: Min assist, HOB elevated, Used rails     Sit to sidelying: Mod assist, Used rails General bed mobility comments:  Min assist to roll several times for repositioning and prior to rising, using rail with cues for technique. Min assist for trunk support to rise, able to weakly grip rail to complete. Mod assist for guidance of trunk onto bed and LEs into bed.    Transfers Overall transfer level: Needs assistance Equipment used: Rolling walker (2 wheels) Transfers: Sit to/from Stand Sit to Stand: Max assist, From elevated surface           General transfer comment: Max assist for boost and balance, wide BOS, significant sway with reduced trunk control.      Balance Overall balance assessment: Needs assistance Sitting-balance support: No upper extremity supported, Feet supported Sitting balance-Leahy Scale: Fair Sitting balance - Comments: CGA EOB   Standing balance support: Bilateral upper extremity supported, Reliant on assistive device for balance Standing balance-Leahy Scale: Zero                             ADL either performed or assessed with clinical judgement   ADL Overall ADL's : Needs assistance/impaired Eating/Feeding: Set up;Bed level   Grooming: Minimal assistance;Sitting Grooming Details (indicate cue type and reason): for sitting balance intermittently Upper Body Bathing: Minimal assistance;Sitting   Lower Body Bathing: Maximal assistance;Sitting/lateral leans   Upper Body Dressing : Minimal assistance;Sitting   Lower Body Dressing: Maximal assistance;Sitting/lateral leans   Toilet Transfer: Maximal assistance Toilet Transfer Details (indicate cue type and reason): STS only this session         Functional mobility during ADLs: Maximal assistance (for STS)       Vision Baseline Vision/History: 1 Wears glasses Ability to See in Adequate Light: 0 Adequate Patient  Visual Report: Blurring of vision;Other (comment) (blurring of vision and reporting vision seems transient) Vision Assessment?: Yes Tracking/Visual Pursuits: Able to track stimulus in all  quads without difficulty Convergence: Within functional limits Visual Fields: No apparent deficits Additional Comments: OT to continue to assess functionally     Perception         Praxis         Pertinent Vitals/Pain Pain Assessment Pain Assessment: Faces Faces Pain Scale: Hurts a little bit Pain Location: finger tips bil, Rt knee, bil feet Pain Descriptors / Indicators: Sore Pain Intervention(s): Limited activity within patient's tolerance, Monitored during session     Extremity/Trunk Assessment Upper Extremity Assessment Upper Extremity Assessment: Generalized weakness;Right hand dominant;RUE deficits/detail;LUE deficits/detail RUE Deficits / Details: generalized weakness, 3+/5 grip/push/pull, 3/5 able to take some resistance but not sustain for 3+/5. shakiness with finger to thumb opposition LUE Deficits / Details: generalized weakness, 3+/5 grip/push/pull, 3/5 able to take some resistance but not sustain for 3+/5. shakiness with finger to thumb opposition   Lower Extremity Assessment Lower Extremity Assessment: Defer to PT evaluation       Communication Communication Communication: Impaired Factors Affecting Communication: Reduced clarity of speech   Cognition Arousal: Alert Behavior During Therapy: WFL for tasks assessed/performed Cognition: Cognition impaired       Memory impairment (select all impairments): Short-term memory   Executive functioning impairment (select all impairments): Problem solving OT - Cognition Comments: pt scoring a 2 on the short blessed test with mild deficits in STM. pt with questionable recall of PT session initially reported she stood up with the physical therapist but later reports she has not stood up today.                 Following commands: Intact       Cueing  General Comments   Cueing Techniques: Verbal cues;Gestural cues      Exercises     Shoulder Instructions      Home Living Family/patient expects to  be discharged to:: Private residence Living Arrangements: Spouse/significant other;Children Available Help at Discharge: Family;Available PRN/intermittently Type of Home: House Home Access: Stairs to enter [employer] of Steps: 7 Entrance Stairs-Rails: Right;Left Home Layout: One level     Bathroom Shower/Tub: Chief Strategy Officer: Standard Bathroom Accessibility: No   Home Equipment: None          Prior Functioning/Environment Prior Level of Function : Independent/Modified Independent;Driving;Working/employed;History of Falls (last six months)             Mobility Comments: ind, recent fall, on ground 4 hours ADLs Comments: ind, working in billing/collections for [employer]    OT Problem List: Decreased strength;Decreased activity tolerance;Impaired balance (sitting and/or standing);Decreased safety awareness   OT Treatment/Interventions: Self-care/ADL training;Therapeutic exercise;DME and/or AE instruction;Neuromuscular education;Therapeutic activities;Patient/family education;Balance training;Cognitive remediation/compensation;Visual/perceptual remediation/compensation      OT Goals(Current goals can be found in the care plan section)   Acute Rehab OT Goals Patient Stated Goal: get better OT Goal Formulation: With patient Time For Goal Achievement: 09/11/24 Potential to Achieve Goals: Good   OT Frequency:  Min 2X/week    Co-evaluation              AM-PAC OT 6 Clicks Daily Activity     Outcome Measure Help from another person eating meals?: A Little Help from another person taking care of personal grooming?: A Little Help from another person toileting, which includes using toliet, bedpan, or urinal?: Total Help from another person bathing (including  washing, rinsing, drying)?: A Lot Help from another person to put on and taking off regular upper body clothing?: A Little Help from another person to put on and taking off regular  lower body clothing?: Total 6 Click Score: 13   End of Session Equipment Utilized During Treatment: Gait belt;Rolling walker (2 wheels) Nurse Communication: Mobility status  Activity Tolerance: Patient tolerated treatment well Patient left: in bed;with call bell/phone within reach;with bed alarm set;with nursing/sitter in room  OT Visit Diagnosis: Unsteadiness on feet (R26.81);Muscle weakness (generalized) (M62.81);Other symptoms and signs involving cognitive function                Time: 9076-9054 OT Time Calculation (min): 22 min Charges:  OT General Charges $OT Visit: 1 Visit OT Evaluation $OT Eval Moderate Complexity: 1 Mod  Elma JONETTA Lebron FREDERICK, OTR/L [facility] Acute Rehabilitation Office: [phone]   Elma JONETTA Lebron 08/28/2024, 12:48 PM

## 2024-08-28 NOTE — Plan of Care (Signed)
  Problem: Education: Goal: Knowledge of General Education information will improve Description: Including pain rating scale, medication(s)/side effects and non-pharmacologic comfort measures Outcome: Progressing   Problem: Health Behavior/Discharge Planning: Goal: Ability to manage health-related needs will improve Outcome: Not Progressing   Problem: Clinical Measurements: Goal: Ability to maintain clinical measurements within normal limits will improve Outcome: Not Progressing Goal: Will remain free from infection Outcome: Progressing Goal: Diagnostic test results will improve Outcome: Progressing Goal: Respiratory complications will improve Outcome: Progressing Goal: Cardiovascular complication will be avoided Outcome: Progressing   Problem: Activity: Goal: Risk for activity intolerance will decrease Outcome: Not Progressing

## 2024-08-28 NOTE — Progress Notes (Addendum)
 PROGRESS NOTE    Deanna Goodwin  FMW:[id number] DOB: 12/09/1960 DOA: 08/27/2024 PCP: Tanda Bleacher, MD   Brief Narrative:  The patient is a 63 year old obese African-American female with a past medical history significant for but not limited to essential hypertension history of seizures, and history of nephrolithiasis status post ureteroscopy and stent placement with lithotripsy who presents with a 1 week history of ascending weakness involving all 4 extremities with primarily in bilateral upper extremities.  The weakness started the distal aspects and started insulin proximal progression.  She was recently seen in the ED on 1127 with similar symptoms and the brain MRI done showed no acute process and no evidence of infarct.  Given her continued worsening and generalized weakness she presented again and neurology was consulted and recommended MRI C-spine which was negative.  There is high concern for Guillain-Barr syndrome so we will neurology is obtaining a diagnostic LP and then likely can start her on IVIG or Plex after LP results have returned.  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  No overt acute viral process leading up to development of the symptoms and no recent Influenza Vaccine Administration.  No evidence of respiratory compromise at this time, but will closely monitor serial NIF/VC q12h with RT consult.  MRI cervical spine showed evidence of acute cervical spine process, while MRI brain performed on 08/23/2024 after initiation of these symptoms, showed no evidence of acute process, including no evidence of acute ischemic infarct. Neurology consulted and recommended a Diagnostic LP to be performed under fluoroscopy and sending CSF for Cell Count in Tubes 1 and 4, Protein, Glucose, Meningitis  Encephalitis Panel, CSF IgG, Cx and VDRL. Neurology is planning to start Tx w/ IVIG or Plex after LP results have returned. Continue Close monitoring ensuing presence of deep tendon reflexes. PT/OT recommending CIR. Repeat CMP, CBC in the morning and start Gentle IVF Hydration with NS @ 75 mL/hr   Hypokalemia: K+ went from 3.3 -> 3.4. Replete w/ po KCL 40 mEQ BID x2. CTM and Replete as Necessary. Repeat CMP in the AM   Hypomagnesemia: Improved. Mag Level went from 1.5 -> 2.1. CTM and Replete as Necessary. Repeat CMP in the AM    Asymptomatic Pyuria: Presenting U/A suggestive of pyuria but does not meet quantitative threshold for significance in a female. Additionally, no acute urinary symptoms.  Overall, these findings appear most consistent with asymptomatic and insignificant pyuria, and do not appear to warrant independent antibiotic coverage for such.  In the absence of objective fever and in the absence of leukocytosis, SIRS grazier for sepsis are not currently met.  Additionally, she appears hemodynamically stable. CTM   Elevated AST: AST went fom 81 -> 68. CTM and Trend and repeat CMP in the AM and if LFTs continue to be elevated or not improving will consider right upper quadrant ultrasound and acute hepatitis panel.  Elevated Anion Gap: AG is now 18, Chloride is 93, and CO2 is now 23. CTM and Trend and Repeat CMP in the AM   Essential Hypertension: Does not appear to be on any medications from [facility]. CTM BP per Protocol. Last BP reading was now on the softer side at 92/80   Chronic Tobacco Abuse: Patient conveyed she is a current smoker, having smoked a pack per day for nearly 40 years.  Counseling given. C/w Nicotine 14 mg TD Patch and Nicotine Prolacrilex 2 mg po PRN Smoking Cessation    Hypoalbuminemia: Patient's Albumin Lvl ranging from 2.5-3.1. CTM & Trend & repeat CMP in the AM  Class I Obesity: Complicates overall prognosis and care. Estimated body mass index is 33.66 kg/m as  calculated from the following:   Height as of this encounter: 5' 3 (1.6 m).   Weight as of this encounter: 86.2 kg. Weight Loss and Dietary Counseling given   DVT prophylaxis: SCDs Start: 08/28/24 0008    Code Status: Full Code Family Communication: No family present @ bedside  Disposition Plan:  Level of care: Progressive Status is: Inpatient Remains inpatient appropriate because: Needs further improvement as PT OT recommending CIR and she will need clearance by neurology she is being worked up for ascending weakness which was highly concerning for [employer]   Consultants:  Neurology  Procedures:  As delineated as above  Antimicrobials:  Anti-infectives (From admission, onward)    None       Subjective: Seen and examined at bedside and she is still feeling weak.  Denied any shortness of breath.  No nausea or vomiting.  Denies lightheadedness or dizziness.  No other concerns or complaints at this time and awaiting LP to be done  Objective: Vitals:   08/28/24 0533 08/28/24 0700 08/28/24 0908 08/28/24 1138  BP: 125/61 (!) 111/96  92/80  Pulse: 78     Resp: 14 18 14 19   Temp:  97.8 F (36.6 C)  97.8 F (36.6 C)  TempSrc: Oral Oral  Oral  SpO2: 97% 98%  98%  Weight: 86.2 kg     Height:        Intake/Output Summary (Last 24 hours) at 08/28/2024 1443 Last data filed at 08/28/2024 0800 Gross per 24 hour  Intake 1690 ml  Output --  Net 1690 ml   Filed Weights   08/27/24 1838 08/28/24 0312 08/28/24 0533  Weight: 95 kg 86.2 kg 86.2 kg   Examination: Physical Exam:  Constitutional: WN/WD obese chronically ill-appearing female who appears overall comfortable Respiratory: Diminished to auscultation bilaterally, no wheezing, rales, rhonchi or crackles. Normal respiratory effort and patient is not tachypenic. No accessory muscle use.  Wearing supplemental oxygen via nasal cannula for comfort Cardiovascular: RRR, no murmurs / rubs / gallops. S1 and S2 auscultated. No  extremity edema.  Abdomen: Soft, non-tender, distended 2/2 body habitus. Bowel sounds positive.  GU: Deferred. Musculoskeletal: No clubbing / cyanosis of digits/nails. No joint deformity upper and lower extremities.  Skin: No rashes, lesions, ulcers on a limited skin evaluation. No induration; Warm and dry.  Neurologic: CN 2-12 grossly intact with no focal deficits but does have some weakness noted. Romberg sign cerebellar reflexes not assessed.  Psychiatric: Awake and alert   Data Reviewed: I have personally reviewed following labs and imaging studies  CBC: Recent Labs  Lab 08/23/24 1701 08/27/24 1854 08/28/24 0117  WBC 6.8 7.0 7.1  NEUTROABS 4.9  --  5.3  HGB 14.9 15.2* 13.4  HCT 43.0 47.4* 40.0  MCV 98.9 107.7* 102.0*  PLT 276 215 196   Basic Metabolic Panel: Recent Labs  Lab 08/23/24 1701 08/27/24 1854 08/27/24 2100 08/28/24 0117  NA 131* 134*  --  136  K 3.5 3.3*  --  3.4*  CL 91* 96*  --  93*  CO2 27 23  --  25  GLUCOSE 151* 91  --  106*  BUN <5* 5*  --  5*  CREATININE 0.68 0.59  --  0.59  CALCIUM  8.7* 8.5*  --  8.4*  MG 1.4*  --  1.5* 2.1   GFR: Estimated Creatinine Clearance: 76.8 mL/min (by C-G formula based on SCr of 0.59 mg/dL). Liver Function Tests: Recent Labs  Lab 08/23/24 1701 08/27/24 1854 08/28/24 0117  AST 77* 81* 68*  ALT 23 23 20   ALKPHOS 95 95 88  BILITOT 0.8 0.9 0.9  PROT 6.9 6.8 5.8*  ALBUMIN 3.1* 2.8* 2.5*   Recent Labs  Lab 08/23/24 1701  LIPASE 38   No results for input(s): AMMONIA in the last 168 hours. Coagulation Profile: No results for input(s): INR, PROTIME in the last 168 hours. Cardiac Enzymes: No results for input(s): CKTOTAL, CKMB, CKMBINDEX, TROPONINI in the last 168 hours. BNP (last 3 results) No results for input(s): PROBNP in the last 8760 hours. HbA1C: No results for input(s): HGBA1C in the last 72 hours. CBG: No results for input(s): GLUCAP in the last 168 hours. Lipid Profile: No  results for input(s): CHOL, HDL, LDLCALC, TRIG, CHOLHDL, LDLDIRECT in the last 72 hours. Thyroid Function Tests: No results for input(s): TSH, T4TOTAL, FREET4, T3FREE, THYROIDAB in the last 72 hours. Anemia Panel: No results for input(s): VITAMINB12, FOLATE, FERRITIN, TIBC, IRON, RETICCTPCT in the last 72 hours. Sepsis Labs: No results for input(s): PROCALCITON, LATICACIDVEN in the last 168 hours.  Recent Results (from the past 240 hours)  Wet prep, genital     Status: None   Collection Time: 08/18/24  6:46 PM  Result Value Ref Range Status   Yeast Wet Prep HPF POC NONE SEEN NONE SEEN Final    Comment: Specimen diluted due to transport tube containing more than 1 ml of saline, interpret results with caution.   Trich, Wet Prep NONE SEEN NONE SEEN Final   Clue Cells Wet Prep HPF POC NONE SEEN NONE SEEN Final   WBC, Wet Prep HPF POC <10 <10 Final   Sperm NONE SEEN  Final    Comment: Performed at [facility], [address] [place], [place] [address]  MRSA Next Gen by PCR, Nasal     Status: None   Collection Time: 08/28/24  3:08 AM   Specimen: Nasal Mucosa; Nasal Swab  Result Value Ref Range Status   MRSA by PCR Next Gen NOT DETECTED NOT DETECTED Final    Comment: (NOTE) The GeneXpert MRSA Assay (FDA approved for NASAL specimens only), is one component of a comprehensive MRSA colonization surveillance program. It is not intended to diagnose MRSA infection nor to guide or monitor treatment for MRSA infections. Test performance is not FDA approved in patients less than 33 years old. Performed at [facility], [address], [place], [place] [address]   CSF culture w Gram Stain     Status: None (Preliminary result)   Collection Time: 08/28/24 10:44 AM   Specimen: PATH Cytology CSF; Cerebrospinal Fluid  Result Value Ref Range Status   Specimen Description CSF  Final   Special Requests NONE  Final   Gram Stain   Final    NO WBC  SEEN NO ORGANISMS SEEN CYTOSPIN SMEAR Performed at [facility], [address], [place], [place] [address]    Culture PENDING  Incomplete   Report Status PENDING  Incomplete    Radiology Studies: DG Lumbar Puncture Fluoro Guide Result Date: 08/28/2024 CLINICAL DATA:  Patient admitted with ascending weakness and numbness with concern for Guillain-Barre syndrome. Diagnostic lumbar puncture requested. EXAM:  LUMBAR PUNCTURE UNDER FLUOROSCOPY PROCEDURE: An appropriate skin entry site was determined fluoroscopically. Operator donned sterile gloves and mask. Skin site was marked, then prepped with Betadine, draped in usual sterile fashion, and infiltrated locally with 1% lidocaine . A 20 gauge spinal needle advanced into the thecal sac at L2-L3. Clear colorless CSF spontaneously returned, with opening pressure of 24 cm water. 12.4 ml CSF were collected and divided among 4 sterile vials for the requested laboratory studies. Closing pressure 15 cm water The needle was then removed. The patient tolerated the procedure well and there were no complications. FLUOROSCOPY: Radiation Exposure Index (as provided by the fluoroscopic device): 26.1 mGy Kerma IMPRESSION: Technically successful lumbar puncture under fluoroscopy. This exam was performed by Warren Dais, NP, and was supervised and interpreted by Dr. Philip. Electronically Signed   By: Juliene Philip M.D.   On: 08/28/2024 11:39   MR Cervical Spine W and Wo Contrast Result Date: 08/28/2024 EXAM: MRI CERVICAL SPINE WITH AND WITHOUT CONTRAST 08/28/2024 12:25:32 AM TECHNIQUE: Multiplanar multisequence MRI of the cervical spine was performed without and with the administration of 10 mL of gadobutrol (GADAVIST) 1 mmol/mL injection. COMPARISON: None available. CLINICAL HISTORY: Myelopathy, acute, cervical spine; ?Guillain-Barr. FINDINGS: BONES AND ALIGNMENT: Normal alignment. Normal vertebral body heights. Marrow signal is unremarkable. No abnormal enhancement.  SPINAL CORD: Normal spinal cord size. Normal spinal cord signal. SOFT TISSUES: No paraspinal mass. SKULL BASE: Partially empty sella turcica. C2-C3: No significant disc herniation. No spinal canal stenosis or neural foraminal narrowing. C3-C4: No significant disc herniation. No spinal canal stenosis or neural foraminal narrowing. C4-C5: No significant disc herniation. No spinal canal stenosis or neural foraminal narrowing. C5-C6: No significant disc herniation. No spinal canal stenosis or neural foraminal narrowing. C6-C7: No significant disc herniation. No spinal canal stenosis or neural foraminal narrowing. C7-T1: No significant disc herniation. No spinal canal stenosis or neural foraminal narrowing. IMPRESSION: 1. No acute findings. Electronically signed by: Franky Stanford MD 08/28/2024 12:35 AM EST RP Workstation: [id number]   Scheduled Meds:  folic acid  1 mg Oral Daily   lidocaine  1 %  5 mL Other Once   potassium chloride   40 mEq Oral BID   [START ON 09/05/2024] thiamine (VITAMIN B1) injection  100 mg Intravenous Daily   Continuous Infusions:  sodium chloride      thiamine (VITAMIN B1) injection 500 mg (08/28/24 0820)   Followed by   NOREEN ON 08/30/2024] thiamine (VITAMIN B1) injection      LOS: 0 days   Alejandro Marker, DO [facility] Available via Epic secure chat 7am-7pm After these hours, please refer to coverage provider listed on [email or web address] 08/28/2024, 2:43 PM

## 2024-08-28 NOTE — Hospital Course (Addendum)
 The patient is a 63 year old obese African-American female with a past medical history significant for but not limited to essential hypertension history of seizures, and history of nephrolithiasis status post ureteroscopy and stent placement with lithotripsy who presents with a 1 week history of ascending weakness involving all 4 extremities with primarily in bilateral upper extremities.  The weakness started the distal aspects and started insulin proximal progression.  She was recently seen in the ED on 1127 with similar symptoms and the brain MRI done showed no acute process and no evidence of infarct.  Given her continued worsening and generalized weakness she presented again and neurology was consulted and recommended MRI C-spine which was negative.  There is high concern for Guillain-Barr syndrome so we will neurology is obtaining a diagnostic LP and then likely can start her on IVIG or Plex after LP results have returned.  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  No overt acute viral process leading up to development of the symptoms and no recent Influenza Vaccine Administration.  No evidence of respiratory compromise at this time, but will closely monitor serial NIF/VC q12h with RT consult.  MRI cervical spine showed evidence of acute cervical spine process, while MRI brain performed on 08/23/2024 after initiation of these symptoms, showed no evidence of acute process, including no evidence of acute ischemic infarct. Neurology consulted and recommended a Diagnostic LP to be performed under fluoroscopy and sending CSF for Cell Count in Tubes 1 and 4, Protein, Glucose, Meningitis Encephalitis Panel, CSF IgG, Cx and VDRL. Neurology is planning to start Tx w/ IVIG or Plex after LP results have returned. Continue  Close monitoring ensuing presence of deep tendon reflexes. PT/OT recommending CIR. Repeat CMP, CBC in the morning and start Gentle IVF Hydration with NS @ 75 mL/hr   Hypokalemia: K+ went from 3.3 -> 3.4. Replete w/ po KCL 40 mEQ BID x2. CTM and Replete as Necessary. Repeat CMP in the AM   Hypomagnesemia: Improved. Mag Level went from 1.5 -> 2.1. CTM and Replete as Necessary. Repeat CMP in the AM    Asymptomatic Pyuria: Presenting U/A suggestive of pyuria but does not meet quantitative threshold for significance in a female. Additionally, no acute urinary symptoms.  Overall, these findings appear most consistent with asymptomatic and insignificant pyuria, and do not appear to warrant independent antibiotic coverage for such.  In the absence of objective fever and in the absence of leukocytosis, SIRS grazier for sepsis are not currently met.  Additionally, she appears hemodynamically stable. CTM   Elevated AST: AST went fom 81 -> 68. CTM and Trend and repeat CMP in the AM and if LFTs continue to be elevated or not improving will consider right upper quadrant ultrasound and acute hepatitis panel.  Elevated Anion Gap: AG is now 18, Chloride is 93, and CO2 is now 23. CTM and Trend and Repeat CMP in the AM   Essential Hypertension: Does not appear to be on any medications from [facility]. CTM BP per Protocol. Last BP reading was now on the softer side at 92/80   Chronic Tobacco Abuse: Patient conveyed she is a current smoker, having smoked a pack per day for nearly 40 years. Counseling given. C/w Nicotine 14 mg TD Patch and Nicotine Prolacrilex 2 mg po PRN Smoking Cessation    Hypoalbuminemia: Patient's Albumin  Lvl ranging from 2.5-3.1. CTM & Trend & repeat CMP in the AM  Class I Obesity: Complicates overall prognosis and care. Estimated body mass index is 33.66 kg/m as calculated from the following:   Height as of this encounter: 5' 3 (1.6 m).   Weight as of this encounter: 86.2 kg. Weight Loss and Dietary  Counseling given

## 2024-08-28 NOTE — Progress Notes (Signed)
 NIF -50 cmh20, VC 1.41 L/min. Patient showed great effort!

## 2024-08-28 NOTE — Evaluation (Signed)
 Physical Therapy Evaluation Patient Details Name: Deanna Goodwin MRN: [id number] DOB: [birth year] Today's Date: 08/28/2024  History of Present Illness  Pt is a 63 y.o. female presenting 12/1 with weakness. Found to have hypokalemia, hypomagnesia.     PMH: HTN, neuropathy.  Clinical Impression  Pt admitted with above complications. 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 Reports one fall recently where she laid on the floor for 4 hours before having EMS assist. Feels weaker today than yesterday. Still having pain in hands and feet as well as Lt knee in particular. She is oriented x3, showing some mild cognitive deficits (specifically memory - OT notified for further assessment into cognition.) She required up to mod assist for bed mobility and max assist to stand from elevated bed surface with RW x2. Proximal weakness preventing hip extension into neutral and fully upright trunk in standing today. LEs grossly ~3/5 but further testing deferred due to Neuro NP arrival to speak with pt. Patient will benefit from intensive inpatient follow-up therapy, >3 hours/day. Pt currently with functional limitations due to the deficits listed below (see PT Problem List). Pt will benefit from acute skilled PT to increase their independence and safety with mobility to allow discharge.           If plan is discharge home, recommend the following: Two people to help with walking and/or transfers;A lot of help with bathing/dressing/bathroom;Assistance with cooking/housework;Assist for transportation;Help with stairs or ramp for entrance   Can travel by private vehicle        Equipment Recommendations Wheelchair (measurements PT);Wheelchair cushion (measurements PT);Hoyer lift;Hospital bed  Recommendations for Other Services  Rehab consult    Functional Status Assessment Patient has had a recent decline in their functional status and demonstrates the ability to make significant  improvements in function in a reasonable and predictable amount of time.     Precautions / Restrictions Precautions Precautions: Fall Restrictions Weight Bearing Restrictions Per Provider Order: No      Mobility  Bed Mobility Overal bed mobility: Needs Assistance Bed Mobility: Rolling, Sidelying to Sit, Sit to Sidelying Rolling: Min assist, Used rails Sidelying to sit: Min assist, HOB elevated, Used rails     Sit to sidelying: Mod assist, Used rails General bed mobility comments: Min assist to roll several times for repositioning and prior to rising, using rail with cues for technique. Min assist for trunk support to rise, able to weakly grip rail to complete. Mod assist for guidance of trunk onto bed and LEs into bed.    Transfers Overall transfer level: Needs assistance Equipment used: Rolling walker (2 wheels) Transfers: Sit to/from Stand Sit to Stand: Max assist, From elevated surface           General transfer comment: Max assist for boost and balance, wide BOS, significant sway with reduced trunk control. Practiced x2 from EOB with adequate grip on RW for support but too weak to safely pivot to recliner with this device and +1 support. Recommend Stedy and discussed with pt. Able to perform lateral scoots along bed with Mod assist and use of bed pad to reduce friction.    Ambulation/Gait               General Gait Details: Deferred for safety at this time.  Stairs            Wheelchair Mobility     Tilt Bed    Modified Rankin (Stroke Patients Only)       Balance Overall  balance assessment: Needs assistance Sitting-balance support: No upper extremity supported, Feet supported Sitting balance-Leahy Scale: Fair Sitting balance - Comments: CGA EOB   Standing balance support: Bilateral upper extremity supported, Reliant on assistive device for balance Standing balance-Leahy Scale: Zero Standing balance comment: Second attempt with standing able to  lock needs but could not extend hips to neutral.                             Pertinent Vitals/Pain Pain Assessment Pain Assessment: Faces Faces Pain Scale: Hurts a little bit Pain Location: finger tips bil, Rt knee, bil feet Pain Descriptors / Indicators: Sore Pain Intervention(s): Limited activity within patient's tolerance, Monitored during session, Repositioned    Home Living Family/patient expects to be discharged to:: Private residence Living Arrangements: Spouse/significant other;Children Available Help at Discharge: Family;Available PRN/intermittently Type of Home: House Home Access: Stairs to enter Entrance Stairs-Rails: Doctor, General Practice of Steps: 7   Home Layout: One level Home Equipment: None      Prior Function Prior Level of Function : Independent/Modified Independent;Driving;Working/employed;History of Falls (last six months)             Mobility Comments: ind, recent fall, on ground 4 hours ADLs Comments: ind, working in billing/collections for [employer]     Extremity/Trunk Assessment   Upper Extremity Assessment Upper Extremity Assessment: Defer to OT evaluation    Lower Extremity Assessment Lower Extremity Assessment: RLE deficits/detail;LLE deficits/detail RLE Deficits / Details: SLR against gravity with drift. ankle and hallux DF 3+/5 (Further assessment limited by neuro entering room to speak with pt.) LLE Deficits / Details: SLR against gravity with drift. ankle and hallux DF 3-/5 (Further assessment limited by neuro entering room to speak with pt.)       Communication   Communication Communication: Impaired Factors Affecting Communication: Reduced clarity of speech    Cognition Arousal: Alert Behavior During Therapy: WFL for tasks assessed/performed   PT - Cognitive impairments: No apparent impairments, Memory                         Following commands: Intact       Cueing Cueing Techniques:  Verbal cues, Gestural cues     General Comments General comments (skin integrity, edema, etc.): Noted gross pocketing of food in mouth after having finished breakfast a bit earlier, also with slurred speech. Occasional cough. RN notified for oral care. Neuro NP notified. VSS throughout on [place], HR slightly above 100.    Exercises     Assessment/Plan    PT Assessment Patient needs continued PT services  PT Problem List Decreased strength;Decreased range of motion;Decreased activity tolerance;Decreased mobility;Decreased balance;Decreased coordination;Decreased cognition;Decreased knowledge of use of DME;Decreased knowledge of precautions;Impaired sensation;Impaired tone;Obesity       PT Treatment Interventions DME instruction;Gait training;Stair training;Functional mobility training;Therapeutic activities;Therapeutic exercise;Balance training;Neuromuscular re-education;Patient/family education    PT Goals (Current goals can be found in the Care Plan section)  Acute Rehab PT Goals Patient Stated Goal: Get well PT Goal Formulation: With patient Time For Goal Achievement: 09/11/24 Potential to Achieve Goals: Good    Frequency Min 3X/week     Co-evaluation               AM-PAC PT 6 Clicks Mobility  Outcome Measure Help needed turning from your back to your side while in a flat bed without using bedrails?: A Little Help needed moving from lying on your back to  sitting on the side of a flat bed without using bedrails?: A Lot Help needed moving to and from a bed to a chair (including a wheelchair)?: Total Help needed standing up from a chair using your arms (e.g., wheelchair or bedside chair)?: Total Help needed to walk in hospital room?: Total Help needed climbing 3-5 steps with a railing? : Total 6 Click Score: 9    End of Session Equipment Utilized During Treatment: Gait belt;Oxygen Activity Tolerance: Other (comment) (Limited by Weakness) Patient left: in bed;with call  bell/phone within reach;with bed alarm set;with nursing/sitter in room (Neuro) Nurse Communication: Mobility status;Need for lift equipment (Pocketing food) PT Visit Diagnosis: Unsteadiness on feet (R26.81);Other abnormalities of gait and mobility (R26.89);Muscle weakness (generalized) (M62.81);History of falling (Z91.81);Difficulty in walking, not elsewhere classified (R26.2);Other symptoms and signs involving the nervous system (R29.898)    Time: 9267-9241 PT Time Calculation (min) (ACUTE ONLY): 26 min   Charges:   PT Evaluation $PT Eval Moderate Complexity: 1 Mod PT Treatments $Therapeutic Activity: 8-22 mins PT General Charges $$ ACUTE PT VISIT: 1 Visit         Leontine Roads, PT, DPT [facility]  Rehabilitation Services Physical Therapist Office: [phone] Website: [email or web address]   Leontine GORMAN Roads 08/28/2024, 8:37 AM

## 2024-08-28 NOTE — Progress Notes (Signed)
   Inpatient Rehab Admissions Coordinator :  Per therapy recommendations, patient was screened for CIR candidacy by Heron Leavell RN MSN.  Medical work up ongoing. R/O GBS. Will await further workup before placing rehab consult. Please call me with any questions.  Heron Leavell RN MSN Admissions Coordinator [phone]
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
## 2024-08-28 NOTE — Discharge Instructions (Signed)
 [employer] assistance programs Crisis assistance programs  -[facility]elessness Arts development officer. If you are experiencing homelessness in [place], [place] , your first point of contact should be Pensions consultant. You can reach Coordinated Entry by calling (336) [phone] or by emailing [email or web address].  Community access points: [employer] [phone] [address], [place]) every Tuesday from 9am-10am. [facility] ([address] [place]) every Wednesday from 8am-9am.   -[place] Coordinated Re-entry Jayson Michael: Dial 211 and request. Offers referrals to homeless shelters in the area.    -The [employer] 507-604-8943) offers several services to local families, as funding allows. The Emergency Assistance Program (EAP), which they administer, provides household goods, free food, clothing, and financial aid to people in need in the [place]  area. The EAP program does have some qualification, and counselors will interview clients for financial assistance by written referral only. Referrals need to be made by the Department of Social Services or by other EAP approved human services agencies or charities in the area.  -[facility] of [employer], which can be reached at 340-450-2385, offers emergency assistance programs for those in need of help, such as food, rent assistance, a soup kitchen, shelter, and clothing. They are based in [facility] [place]  but provide a number of services to those that qualify for assistance.   [facility] Department of Social Services may be able to offer temporary financial assistance and cash grants for paying rent and utilities, Help may be provided for local county residents who may be experiencing personal crisis when other resources, including government programs, are not available. Call [phone]  -High [employer] is a [employer] agency, The organization can offer emergency assistance for paying rent, Caremark Rx, utilities, food, household products and furniture. They offer extensive emergency and transitional housing for families, children and single women, and also run a Boy's and [employer]. Thrift Shops, Secondary school teacher, and other aid offered too. [address], [place], [place]  [address], [phone]  -[place] Low Income Energy Assistance Program -- This is offered for [facility] families. The federal government created [employer] Program provides a one-time cash grant payment to help eligible low-income families pay their electric and heating bills. [address], [place], [place]  [address], [phone]  -[place] Emergency Assistance -- A program offers emergency utility and rent funds for greater [employer] area residents. The program can also provide counseling and referrals to charities and government programs. Also provides food and a free meal program that serves lunch Mondays - Saturdays and dinner seven days per week to individuals in the community. [address] [place], [place]  [address], [phone]  -[employer] - Offers affordable apartment and housing communities across      [place] and [place]. The low income and seniors can access public housing, rental assistance to qualified applicants, and apply for the section 8 rent subsidy program. Other programs include Chiropractor and Engineer, maintenance. [address], [place],   [address], dial [phone].  -[facility] provides transitional housing to veterans and the disabled. Clients will also access other services too, including assistance in applying for Disability, life skills classes, case management, and assistance in finding permanent housing. [address], [place], [place]  [place] [address], call [phone]  -[facility] [place] Transitional Housing through [facility] is for people who were just  evicted or that are formerly homeless. The non-profit will also help then gain self-sufficiency, find a home or apartment to live in, and also provides information on rent assistance when needed. Phone [phone]  -The [place] [facility] [facility] helps low income, elderly, or disabled residents in seven counties in the [place] ([place], [place], [place], [place], [place], [place], [place], and [place]) save energy and reduce their utility bills by improving energy efficiency. Phone [phone].  -[employer] is located in the [place] [facility] in the [employer], [address], [address], [place], [place] [address]. Parking is in the rear of the building. Phone: [phone]   General Email: info@gsohc .org  GHC provides free housing counseling assistance in locating affordable rental housing or housing with support services for families and individuals in crisis and the chronically homeless. We provide potential resources for other housing needs like utilities. Our trained counselors also work with clients on budgeting and financial literacy in effort to empower them to take control of their financial situations. [employer] collaborates with homeless service providers and other stakeholders as part of the [employer] COC (Continuum of Care). The (COC) is a regional/local planning body that coordinates housing and services funding for homeless families and individuals. The role of [facility] in the COC is through housing counseling to work with people we serve on diversion strategies for those that are at imminent risk of becoming homeless. We also work with the Coordinated Assessment/Entry Specialist who attempts to find temporary solutions and/or connects the people  to Housing First, Rapid Re-housing or transitional housing programs. Our Homelessness Prevention Housing Counselors meet with clients on business days (Monday-Fridays, except scheduled holidays) from 8:30 am to 4:30 pm.  Legal assistance for evictions, foreclosure, and more -If you need free legal advice on civil issues, such as foreclosures, evictions, Electronics engineer, government programs, domestic issues and more, Landscape architect of [place]  [facility]) is a Associate Professor firm that provides free legal services and counsel to lower income people, seniors, disabled, and others, The goal is to ensure everyone has access to justice and fair representation. Call them at [phone].  [facility] for Housing and Community Studies can provide info about obtaining legal assistance with evictions. Phone [phone].  Data processing manager  The [employer], [employer]. offers job and Dispensing optician. Resources are focused on helping students obtain the skills and experiences that are necessary to compete in today's challenging and tight job market. The non-profit faith-based community action agency offers internship trainings as well as classroom instruction. Classes are tailored to meet the needs of people in the [facility] region. [place], [place] [address], [phone]  Foreclosure prevention/Debt Services Family Services of the [employer] Credit Counseling Service inludes debt and foreclosure prevention programs for local families. This includes money management, financial advice, budget review and development of a written action plan with a Pensions consultant to help solve specific individual financial problems. In addition, housing and mortgage counselors can also provide pre- and post-purchase homeownership counseling, default resolution counseling (to prevent foreclosure) and reverse mortgage counseling. A Debt Management Program allows  people and families with a high level of credit card or medical debt to consolidate and repay consumer debt and loans to creditors and rebuild positive credit ratings and scores. Contact (336) D7650557.  Community clinics in [place] -Health Department [facility]: [address] [place], [place], [address]. [phone].  -Health Department [facility]: [phone]  [address], [facility], [address]. [phone].  -[facility] offers medical care through a group of doctors, pharmacies and other healthcare related agencies that offer services for low income, uninsured adults in [place]. Also offers adult Dental care and assistance with applying for an [employer]. Call [phone].   [facility]. This center provides low-cost health care to those without health insurance. Services offered include an onsite pharmacy. Phone [phone]. [address], [address], [place].  -Medication Assistance Program serves as a link between pharmaceutical companies and patients to provide low cost or free prescription medications. This service is available for residents who meet certain income restrictions and have no insurance coverage. PLEASE CALL [phone] Jonette Nestle) OR [phone] ([place])  -One Step Further: Materials engineer, The [employer] Support & Nutrition Program, [employer]. Call [phone]/ [phone].  Food pantry and assistance -[facility]ban [facility]-Food Bank: [address], [place] [address]. Phone [phone]  -[facility] Food Pantry: [address], [place], [place] [address]. [phone].  -Missionary Ministry: has the purpose of visiting the sick and shut-ins and provide for needs in the surrounding communities. Call ([phone]. Email: [email or web address] This program provides: Food box for seniors, Financial assistance, Food to meet basic  nutritional needs.  -Meals on Wheels with Senior Resources: [facility] residents age 37 and over who are homebound and unable to obtain and prepare a nutritious meal for themselves are eligible for this service. There may be a waiting list in certain parts of [facility] if the route in that area is full. If you are in [facility] and [place] call [phone] to register. For all other areas call [phone] to register.  -Greater Dietitian: https://findfood.[email or web address].[email or web address]  TRANSPORTATION: -[employer] Department of Health: Call [facility] and [employer] at [phone] for details. AttractionGuides.es  -Access [place]: Access [place] is the [employer]'s shared-ride transportation service for eligible riders who have a disability that prevents them from riding the fixed route bus. Call [phone]. Access [place] riders must pay a fare of $1.50 per trip, or may purchase a 10-ride punch card for $14.00 ($1.40 per ride) or a 40-ride punch card for $48.00 ($1.20 per ride).  -The Shepherd's WHEELS rideshare transportation service is provided for senior citizens (60+) who live independently within [place] city limits and are unable to drive or have limited access to transportation. Call [phone] to schedule an appointment.  -[place] Transportation: For Medicare or Medicaid recipients call [phone]?[facility], wheelchair Carloyn Chi, and ambulatory quotes available.   FLEEING VIOLENCE: -Family Services of the [place]- 24/7 Crisis line [phone]) -[facility] Justice Centers: (336) [phone]-[phone]) [phone])  [place] 2-1-1 is another useful way to locate resources in the community. Visit ShedSizes.ch to find service information online. If you need additional assistance, 2-1-1 Referral Specialists are available 24 hours a day, every day by dialing  2-1-1 or [phone] from any phone. The call is free, confidential, and available in any language.  Affordable Housing Search http://www.nchousingsearch.[facility])   M-F 8a-3p [address] [place]  [place], [place] [address] [phone] Services include: laundry, barbering, support groups, case management, phone & computer access, showers, AA/NA mtgs, mental health/substance abuse nurse, job skills class, disability information, VA assistance, spiritual classes, etc. Winter Shelter available when temperatures are less than 32 degrees.   HOMELESS SHELTERS [facility] Night [facility] at [facility]- Call [phone] ext. [phone]  or ext. [phone]. Located at [address], [place], [place] [address]  [facility] Mens Shelter- Call [phone]. Located at [address] [place] [address].  [facility] [employer]. Call [phone]. Office located at [address], [place] [address].  Pathways Family Housing through [place] [phone].  [facility] Family Shelter- Call [phone]. Located at [address] [place], [place], [place] [address].  Room at the Inn-For Pregnant mothers. Call [phone]. Located at [address] [place], [address].  [facility]-For men in [place]. Call [phone]. [facility]-Shelter in [place]. Call [phone].  Home of [employer] for [employer] [phone]. Office located at [address], [place], [address].  [employer] be agreeable to help with chores. Call [phone] ext. [phone].  Men's: [address] [place], [place] [address]. Women's: [facility]  [address] [place], [place] [address]  Crisis Services Therapeutic Alternatives Mobile Crisis Management- [phone]  [facility] [address], [place], [place] [address]. Phone: [phone] Rent/Utility Assistance in  [facility]:  INNOVATIVE PATHWAYS [address] [place], [place] [address] [phone] Mon 8:00am - 6:00pm; Tue 8:00am - 6:00pm; Wed 8:00am - 6:00pm; Thu 8:00am - 6:00pm; Fri 8:00am - 6:00pm; Email: [email or web address] Eligibility: Residents of [place], [place], [place], [place], [place] and [place] that meet income limits. Call or text for eligibility screening.   [facility] MINISTRY [address] [place], [place], [place] [address] [phone] (Main: Rental Assistance) [phone] (Main: Utility Assistance) Mon 8:30am - 5:00pm; Tue 8:30am - 5:00pm; Wed 8:30am - 5:00pm; Thu 8:30am - 5:00pm; Fri 8:30am - 5:00pm; Website: http://www.greensborourbanministry.org/emergency-assistance-program Eligibility: People who have an unexpected crisis or emergency that can be verified. Must have some form of income and meet income limits. At the first of the month, only helps with rent/mortgage assistance for those who have court ordered eviction notices. Call for application information. Call for exact documents that will be needed. Examples of documents that may be needed: Photo ID, Social Security cards for everyone in the household, and proof of income for previous 2 months. Copy of eviction notice for rent assistance and copy of final notice for utility assistance. Statements or receipts of bills for previous 2 months.   [facility] - [place] [address], [place], [place] [address] [phone] (Main) [phone] (Alternate) Mon 9:00am - 5:00pm; Tue 9:00am - 5:00pm; Wed 9:00am - 5:00pm; Thu 9:00am - 5:00pm; Fri 9:00am - 5:00pm; Website: http://[email or web address].salvationarmy.org/[place]/emergency-financial-assistance Email: [email or web address] Eligibility: People experiencing a housing crisis with past-due rent and/or utilities and meet income limits. Must be willing to take part in 6 Call or visit website to download  application. Return complete application by mail or email only. Documents: Help with Utilities: Photo ID, proof of household income, copies of monthly bills or receipts, and a final disconnection/shut-off notice. Help with Rent or Mortgage: Photo ID, proof of income, copies of monthly bills or receipts, and eviction notice. Help with Household Goods: Photo ID, proof of household income, copies of monthly bills or receipts, and a fire or flood report.  [facility] [place] [address], [place], [place] [address] [phone] (Main) Mon 8:00am - 5:00pm; Tue 8:00am - 5:00pm; Wed 8:00am - 5:00pm; Thu 8:00am - 5:00pm; Fri 8:00am - 12:00pm; Website: http://southernusa.salvationarmy.org/[place]/emergency-financial-assistance Email: [email or web address] Call for eligibility information. Apply :Utilities Assistance: Visit office by 8:30am on 1st and 4th Monday of each  month to pick up application. Rent and Mortgage Assistance: Visit office by 8:30am on 2nd and 3rd Monday of each month to pick up application. NOTE: If Monday falls on a holiday applications can be picked up the following Tuesday. Documents required will be listed on application.  [facility] [place] [phone] (Main) Seen by appointment only. Call for more information. Eligibility: Meet income limits. Apply: Call for information on how to schedule an appointment. Each month there is a specific day to call to schedule an appointment. It is stated on the agency voicemail message. Appointments fill up quickly each month. Documents: Photo ID, copy of current utility bill.  [facility] [place] [address] [place], [place] [address] [phone] (Main) Tue 9:00am - 4:00pm; Wed 9:00am - 4:00pm; Thu 9:00am - 4:00pm; Website: http://www.[email or web address] Email: [email or web address] [email or web address] Eligibility: Utility Assistance: Meet income limits and be a Haematologist. [employer] customers do not qualify. Must not have received utility assistance for another agency within the last 90 days. Rent Assistance: Residents of [place] who meet income limits. Must not have received rent assistance for another agency within the last 90 days. Apply: Call to schedule an appointment. Documents: Utility Assistance: Photo ID, City of [employer], copy of lease (if not paying a mortgage), proof of income, and monthly expenses. Rent Assistance: Photo ID, W-9 from the landlord, copy of the lease, proof of income, and a list of monthly expenses.  [facility] [place] [address] [place], [place] [address] [phone] (Main: Help With Rent) [phone] (Main: Help With Utilities) Mon 9:00am - 4:00pm; Tue 9:00am - 4:00pm; Wed 9:00am - 4:00pm; Thu 9:00am - 4:00pm; Fri 9:00am - 4:00pm; Website: MotivationalSites.no Email: [email or web address] Eligibility: People experiencing a financial crisis. Apply: Call to schedule an appointment Wednesday, 7:30am. Documents: Photo ID, Social Security card, proof of income, and proof of address. Other documents may be required, depending on service. Call for more information.  LOW INCOME ENERGY ASSISTANCE PROGRAM DEPARTMENT OF SOCIAL SERVICES - [facility] [address], [place], [place] [address] [phone] (Main) Mon 8:00am - 5:00pm; Tue 8:00am - 5:00pm; Wed 8:00am - 5:00pm; Thu 8:00am - 5:00pm; Fri 8:00am - 5:00pm; Website: [email or web address] Eligibility: Meet income limits and resource guidelines. Each household is only eligible once, even if multiple members apply. Apply: Call to see if funds are available. Visit to complete an application, call to have 1 mailed, or apply online at [email or web address] NOTE: Households with a person age 47  and over or a person with a documented disability can apply beginning December 1. Other households can apply beginning January 1. Documents: Photo ID, birth certificate, proof of household income, copy of utility bill, latest bank statement, the names and Social Security numbers for everyone in the household, and proof of disability if under age 103.  LOW INCOME ENERGY ASSISTANCE PROGRAM DEPARTMENT OF SOCIAL SERVICES - [facility] [address] [place], [place], [place] [address] [phone] (Main) Mon 8:00am - 5:00pm; Tue 8:00am - 5:00pm; Wed 8:00am - 5:00pm; Thu 8:00am - 5:00pm; Fri 8:00am - 5:00pm; Website: [email or web address] Eligibility: Meet income limits and resource guidelines. Each household is only eligible once, even if multiple members apply. Apply: Call to see if funds are available. Visit to complete an application, call to have 1 mailed, or apply online at [email or web address] NOTE: Households with a person age 47 and over or a person with a documented  disability can apply beginning December 1. Other households can apply beginning January 1. Documents: Photo ID, birth certificate, proof of household income, copy of utility bill, latest bank statement, the names and Social Security numbers for everyone in the household, and proof of disability if under age 11.

## 2024-08-28 NOTE — ED Notes (Signed)
 Pt dropped to 77% on RA while sleeping. 2L [place] placed with sats improving to 98%.

## 2024-08-28 NOTE — Progress Notes (Signed)
 NEUROLOGY CONSULT FOLLOW UP NOTE   Date of service: August 28, 2024 Patient Name: Deanna Goodwin MRN:  [id number] DOB:  [birth year]  Interval Hx/subjective  Patient reports continued ascending weakness in upper and lower extremities, states she is having difficulty swallowing, speaking with a little bit of dyspnea.  Lumbar puncture is pending.  She has remained hemodynamically stable and afebrile overnight Vitals   Vitals:   08/28/24 0245 08/28/24 0312 08/28/24 0533 08/28/24 0700  BP: 126/62 (!) 143/88 125/61 (!) 111/96  Pulse: 85 89 78   Resp: 16 16 14 18   Temp:  97.9 F (36.6 C)  97.8 F (36.6 C)  TempSrc:  Oral Oral Oral  SpO2: 100% 100% 97% 98%  Weight:  86.2 kg 86.2 kg   Height:  5' 3 (1.6 m)       Body mass index is 33.66 kg/m.  Physical Exam   Constitutional: Appears well-developed and well-nourished.  Psych: Affect blunted Eyes: No scleral injection.  HENT: No OP obstrucion.  Head: Normocephalic.  Cardiovascular: Normal rate and regular rhythm.  Respiratory: Effort normal, non-labored breathing.  Skin: WDI.   Neurologic Examination    NEURO:  Mental Status: Patient is alert and oriented to person to place time and situation but does have some forgetfulness Speech/Language: speech is with very mild dysarthria and no aphasia  Cranial Nerves:  II: PERRL.  III, IV, VI: EOMI. Eyelids elevate symmetrically.  V: Sensation is intact to light touch and symmetrical to face.  VII: Smile is symmetrical.  VIII: hearing intact to voice. IX, X: Voice is mildly dysarthric XII: tongue is midline without fasciculations. Motor: 4 -/5 strength to bilateral upper extremities, proximal and distal, 3/5 strength hip flexion, 2/5 strength knee flexion, knee extension, dorsiflexion and plantarflexion Tone: is normal and bulk is normal Sensation- Intact to light touch bilaterally but diminished in bilateral upper and lower extremities, worse in the hands and feet and improving  at knee and upper arm level DTRs: Absent Gait- deferred    Medications  Current Facility-Administered Medications:    acetaminophen  (TYLENOL ) tablet 650 mg, 650 mg, Oral, Q6H PRN **OR** acetaminophen  (TYLENOL ) suppository 650 mg, 650 mg, Rectal, Q6H PRN, Howerter, Justin B, DO   folic acid (FOLVITE) tablet 1 mg, 1 mg, Oral, Daily, Haadi Santellan, MD   melatonin tablet 3 mg, 3 mg, Oral, QHS PRN, Howerter, Justin B, DO   nicotine (NICODERM CQ - dosed in mg/24 hours) patch 14 mg, 14 mg, Transdermal, Daily PRN, Howerter, Justin B, DO   nicotine polacrilex (NICORETTE) gum 2 mg, 2 mg, Oral, PRN, Howerter, Justin B, DO   ondansetron  (ZOFRAN ) injection 4 mg, 4 mg, Intravenous, Q6H PRN, Howerter, Justin B, DO   thiamine (VITAMIN B1) 500 mg in sodium chloride  0.9 % 50 mL IVPB, 500 mg, Intravenous, Q8H **FOLLOWED BY** [START ON 08/30/2024] thiamine (VITAMIN B1) 250 mg in sodium chloride  0.9 % 50 mL IVPB, 250 mg, Intravenous, Daily **FOLLOWED BY** [START ON 09/05/2024] thiamine (VITAMIN B1) injection 100 mg, 100 mg, Intravenous, Daily, Keesha Pellum, MD  Labs and Diagnostic Imaging   CBC:  Recent Labs  Lab 08/23/24 1701 08/27/24 1854 08/28/24 0117  WBC 6.8 7.0 7.1  NEUTROABS 4.9  --  5.3  HGB 14.9 15.2* 13.4  HCT 43.0 47.4* 40.0  MCV 98.9 107.7* 102.0*  PLT 276 215 196    Basic Metabolic Panel:  Lab Results  Component Value Date   NA 136 08/28/2024   K 3.4 (L) 08/28/2024   CO2  25 08/28/2024   GLUCOSE 106 (H) 08/28/2024   BUN 5 (L) 08/28/2024   CREATININE 0.59 08/28/2024   CALCIUM  8.4 (L) 08/28/2024   GFRNONAA >60 08/28/2024   GFRAA >60 10/16/2017   Lipid Panel:  Lab Results  Component Value Date   LDLCALC 87 12/20/2016   HgbA1c: No results found for: HGBA1C Urine Drug Screen:     Component Value Date/Time   LABOPIA NONE DETECTED 08/27/2024 2014   COCAINSCRNUR NONE DETECTED 08/27/2024 2014   LABBENZ NONE DETECTED 08/27/2024 2014   AMPHETMU NONE DETECTED  08/27/2024 2014   THCU POSITIVE (A) 08/27/2024 2014   LABBARB NONE DETECTED 08/27/2024 2014    Alcohol Level     Component Value Date/Time   [facility] <15 08/27/2024 2100    MRI Brain and cervical spine (Personally reviewed): No acute abnormalities   Assessment   Deanna Goodwin is a 63 y.o. female with history of hypertension who presents with ascending weakness and numbness of bilateral upper and lower extremities.  Patient reports that symptoms started about a week ago and have been steadily increasing.  She does report some difficulty swallowing and speaking as well as mild dyspnea.  Given ascending weakness and numbness as well as areflexia, Guillan Barre syndrome is high on the differential.  Lumbar puncture under fluoroscopy is pending.  Plan to start treatment with IVIG or Plex if albumin no cytologic dissociation is seen on LP.  Recommendations  -Lumbar puncture under fluoroscopy, send CSF for cell count and tubes 1 and 4, protein, glucose, meningitis encephalitis panel, CSF IgG, culture and VDRL - Every 12 hour NIF and vital capacity - Plan to start treatment with IVIG or Plex after lumbar puncture results have returned. - Neurology will continue to follow ______________________________________________________________________ Patient seen by NP and then by MD, MD to edit note as needed.  Signed, Cortney E Everitt Clint Kill, NP [facility]   NEUROHOSPITALIST ADDENDUM Performed a face to face diagnostic evaluation.   I have reviewed the contents of history and physical exam as documented by PA/ARNP/Resident and agree with above documentation.  I have discussed and formulated the above plan as documented. Edits to the note have been made as needed.  Impression/Key exam findings/Plan: ascending weakness that is progressively worsening. Given symptoms have been worsening over the last week and a half to two week, with areflexia, high clinical suspicion for GBS. 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  I discussed treatment options with patient including IVIG vs PLEX. Discussed mode of administration, risks and benefits and side effects. She opted to go with IVIG since its shorter and she does not want a central line for PLEX. IVIG ordered for tonight.  Plan discussed with Dr. Sherrill with the Hospitalist team.  I personally spent a total of 60 minutes in the care of the patient today including preparing to see the patient, getting/reviewing separately obtained history, performing a medically appropriate exam/evaluation, counseling and educating, placing orders, referring and communicating with other health care professionals, documenting clinical information in the EHR, independently interpreting results, communicating results, and coordinating care.     Deanna Gopal, MD [facility] [phone]   If 7pm to 7am, please call on call as listed on AMION.

## 2024-08-28 NOTE — Progress Notes (Signed)
 NIF -40 cmh20, VC 1.4 L/min.  Excellent effort given

## 2024-08-28 NOTE — Plan of Care (Signed)
  Problem: Nutrition: Goal: Adequate nutrition will be maintained Outcome: Progressing   Problem: Coping: Goal: Level of anxiety will decrease Outcome: Progressing   Problem: Elimination: Goal: Will not experience complications related to bowel motility Outcome: Progressing Goal: Will not experience complications related to urinary retention Outcome: Progressing   Problem: Safety: Goal: Ability to remain free from injury will improve Outcome: Progressing   Problem: Skin Integrity: Goal: Risk for impaired skin integrity will decrease Outcome: Progressing   Problem: Education: Goal: Knowledge of General Education information will improve Description: Including pain rating scale, medication(s)/side effects and non-pharmacologic comfort measures Outcome: Not Progressing   Problem: Health Behavior/Discharge Planning: Goal: Ability to manage health-related needs will improve Outcome: Not Progressing   Problem: Clinical Measurements: Goal: Ability to maintain clinical measurements within normal limits will improve Outcome: Not Progressing   Problem: Activity: Goal: Risk for activity intolerance will decrease Outcome: Not Progressing   Problem: Pain Managment: Goal: General experience of comfort will improve and/or be controlled Outcome: Not Progressing

## 2024-08-29 ENCOUNTER — Inpatient Hospital Stay (HOSPITAL_COMMUNITY)

## 2024-08-29 DIAGNOSIS — R531 Weakness: Secondary | ICD-10-CM | POA: Diagnosis not present

## 2024-08-29 LAB — COMPREHENSIVE METABOLIC PANEL WITH GFR
ALT: 17 U/L (ref 0–44)
AST: 55 U/L — ABNORMAL HIGH (ref 15–41)
Albumin: 2.2 g/dL — ABNORMAL LOW (ref 3.5–5.0)
Alkaline Phosphatase: 71 U/L (ref 38–126)
Anion gap: 11 (ref 5–15)
BUN: <5 mg/dL — ABNORMAL LOW (ref 8–23)
CO2: 29 mmol/L (ref 22–32)
Calcium: 8.4 mg/dL — ABNORMAL LOW (ref 8.9–10.3)
Chloride: 96 mmol/L — ABNORMAL LOW (ref 98–111)
Creatinine, Ser: 0.5 mg/dL (ref 0.44–1.00)
GFR, Estimated: >60 mL/min (ref >60)
Glucose, Bld: 104 mg/dL — ABNORMAL HIGH (ref 70–99)
Potassium: 4.4 mmol/L (ref 3.5–5.1)
Sodium: 136 mmol/L (ref 135–145)
Total Bilirubin: 0.8 mg/dL (ref 0.0–1.2)
Total Protein: 5.8 g/dL — ABNORMAL LOW (ref 6.5–8.1)

## 2024-08-29 LAB — CBC WITH DIFFERENTIAL/PLATELET
Abs Immature Granulocytes: 0.02 K/uL (ref 0.00–0.07)
Basophils Absolute: 0 K/uL (ref 0.0–0.1)
Basophils Relative: 0 %
Eosinophils Absolute: 0 K/uL (ref 0.0–0.5)
Eosinophils Relative: 1 %
HCT: 35.5 % — ABNORMAL LOW (ref 36.0–46.0)
Hemoglobin: 12.1 g/dL (ref 12.0–15.0)
Immature Granulocytes: 1 %
Lymphocytes Relative: 23 %
Lymphs Abs: 1 K/uL (ref 0.7–4.0)
MCH: 33.7 pg (ref 26.0–34.0)
MCHC: 34.1 g/dL (ref 30.0–36.0)
MCV: 98.9 fL (ref 80.0–100.0)
Monocytes Absolute: 0.4 K/uL (ref 0.1–1.0)
Monocytes Relative: 10 %
Neutro Abs: 2.8 K/uL (ref 1.7–7.7)
Neutrophils Relative %: 65 %
Platelets: 187 K/uL (ref 150–400)
RBC: 3.59 MIL/uL — ABNORMAL LOW (ref 3.87–5.11)
RDW: 13.1 % (ref 11.5–15.5)
WBC: 4.2 K/uL (ref 4.0–10.5)
nRBC: 0 % (ref 0.0–0.2)

## 2024-08-29 LAB — LYME DISEASE SEROLOGY W/REFLEX: Lyme Total Antibody EIA: NEGATIVE

## 2024-08-29 LAB — PHOSPHORUS: Phosphorus: 2.9 mg/dL (ref 2.5–4.6)

## 2024-08-29 LAB — MAGNESIUM: Magnesium: 1.6 mg/dL — ABNORMAL LOW (ref 1.7–2.4)

## 2024-08-29 MED ORDER — IOHEXOL 350 MG/ML SOLN
75.0000 mL | Freq: Once | INTRAVENOUS | Status: AC | PRN
  Administered 2024-08-29: 75 mL via INTRAVENOUS

## 2024-08-29 MED ORDER — GABAPENTIN 100 MG PO CAPS
200.0000 mg | ORAL_CAPSULE | Freq: Three times a day (TID) | ORAL | Status: DC
  Administered 2024-08-29 – 2024-08-30 (×4): 200 mg via ORAL
  Filled 2024-08-29 (×4): qty 2

## 2024-08-29 MED ORDER — MORPHINE SULFATE (PF) 2 MG/ML IV SOLN
1.0000 mg | Freq: Once | INTRAVENOUS | Status: AC
  Administered 2024-08-29: 1 mg via INTRAVENOUS
  Filled 2024-08-29: qty 1

## 2024-08-29 MED ORDER — ENOXAPARIN SODIUM 40 MG/0.4ML IJ SOSY
40.0000 mg | PREFILLED_SYRINGE | INTRAMUSCULAR | Status: DC
  Administered 2024-08-29 – 2024-09-06 (×9): 40 mg via SUBCUTANEOUS
  Filled 2024-08-29 (×9): qty 0.4

## 2024-08-29 MED ORDER — LIDOCAINE 4 % EX CREA: TOPICAL_CREAM | Freq: Two times a day (BID) | CUTANEOUS | Status: DC | PRN

## 2024-08-29 MED ORDER — HYDRALAZINE HCL 20 MG/ML IJ SOLN: 10.0000 mg | Freq: Three times a day (TID) | INTRAMUSCULAR | Status: DC | PRN

## 2024-08-29 MED ORDER — MAGNESIUM SULFATE 2 GM/50ML IV SOLN
2.0000 g | Freq: Once | INTRAVENOUS | Status: AC
  Administered 2024-08-29: 2 g via INTRAVENOUS
  Filled 2024-08-29: qty 50

## 2024-08-29 MED ORDER — ACETAMINOPHEN 325 MG PO TABS
325.0000 mg | ORAL_TABLET | Freq: Once | ORAL | Status: AC
  Administered 2024-08-29: 325 mg via ORAL
  Filled 2024-08-29: qty 1

## 2024-08-29 NOTE — Progress Notes (Signed)
 PROGRESS NOTE    Deanna Goodwin  FMW:[id number] DOB: 01-30-1961 DOA: 08/27/2024 PCP: Tanda Bleacher, MD    Brief Narrative:  63 year old female with a past medical history significant for but not limited to hypertension history of seizures, and history of nephrolithiasis status post ureteroscopy and stent placement with lithotripsy who presents with a 1 week history of ascending weakness involving all 4 extremities with primarily in bilateral upper extremities. Pt was recently seen in the ED on 11/27 with similar symptoms and brain MRI done showed no acute process and no evidence of infarct. Given her continued worsening and generalized weakness she presented again and neurology was consulted and recommended MRI C-spine which was negative.  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  Neurology on board, started patient on IVIG.    Today, patient reports significant pain in her lower extremities, back, reports being uncomfortable.  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 MRI cervical spine showed evidence of acute cervical spine process MRI brain performed on 08/23/2024 shows no evidence of acute ischemic infarct Neurology on board, s/p LP under fluoroscopy by IR on 12/2 showed albumin no cytologic dissociation consistent with GBS, other results pending Started patient on IVIG on 12/2 Continue gabapentin, topical lidocaine  Closely monitor serial NIF/VC q12h with RT consult Requiring [place] O2 for comfort as no hypoxia has been noted PT/OT recommending CIR  Mass in oral cavity Dysphagia Unsure if this is tonsillar mass versus other, reports chronic but increasing in size causing some swallowing/speech difficulties CT maxillofacial  ordered SLP on board for modified barium swallow   Hypokalemia/hypomagnesemia Replace as needed    Asymptomatic Pyuria Completely asymptomatic Monitor   Elevated AST Unknown etiology Recent history of ?mild acute pancreatitis on CT in 08/18/2024, repeat CT abdomen indicated to rule out focal pancreatic lesion CT abdomen/pelvis pending Daily CMP  Essential Hypertension Initially BP soft, currently uncontrolled possibly due to generalized pain versus IVIG  Hydralazine IV as needed  Chronic Tobacco Abuse Smokes a pack per day for nearly 40 years Advised to quit  C/w Nicotine 14 mg TD Patch and Nicotine Prolacrilex 2 mg po as needed   Hypoalbuminemia Follow-up   Class I Obesity Lifestyle modification advised    DVT prophylaxis: enoxaparin  (LOVENOX ) injection 40 mg Start: 08/29/24 1045 SCDs Start: 08/28/24 0008    Code Status: Full Code Family Communication: None at bedside  Disposition Plan:  Level of care: Progressive Status is: Inpatient Remains inpatient appropriate because: Level of care  Consultants:  Neurology  Procedures:  LP on 12/2  Antimicrobials:  Anti-infectives (From admission, onward)    None        Objective: Vitals:   08/29/24 0300 08/29/24 0828 08/29/24 1158 08/29/24 1205  BP: (!) 153/95 (!) 120/99 (!) 182/102 (!) 158/80  Pulse: 82 95 97 93  Resp: 16 13 19 17   Temp: 98.3 F (36.8 C) 98 F (36.7 C) 98 F (36.7 C)   TempSrc: Oral Oral Oral Oral  SpO2: 99% 99% 97% 98%  Weight:      Height:        Intake/Output Summary (Last 24 hours) at 08/29/2024 1455 Last data filed at 08/29/2024 1422 Gross per 24 hour  Intake 2911.25 ml  Output 2650 ml  Net 261.25 ml  Filed Weights   08/28/24 0312 08/28/24 0533 08/29/24 0208  Weight: 86.2 kg 86.2 kg 86.8 kg   Examination: Physical Exam: General: NAD  Cardiovascular: S1, S2 present Respiratory: Diminished breath sounds bilaterally, no accessory muscle use Abdomen: Soft, nontender,  nondistended, bowel sounds present Musculoskeletal: No bilateral pedal edema noted Skin: Normal Psychiatry: Normal mood  Neurology: Noted extremity weakness/numbness, otherwise no obvious neurologic deficits noted    Data Reviewed: I have personally reviewed following labs and imaging studies  CBC: Recent Labs  Lab 08/23/24 1701 08/27/24 1854 08/28/24 0117 08/29/24 0523  WBC 6.8 7.0 7.1 4.2  NEUTROABS 4.9  --  5.3 2.8  HGB 14.9 15.2* 13.4 12.1  HCT 43.0 47.4* 40.0 35.5*  MCV 98.9 107.7* 102.0* 98.9  PLT 276 215 196 187   Basic Metabolic Panel: Recent Labs  Lab 08/23/24 1701 08/27/24 1854 08/27/24 2100 08/28/24 0117 08/29/24 0523  NA 131* 134*  --  136 136  K 3.5 3.3*  --  3.4* 4.4  CL 91* 96*  --  93* 96*  CO2 27 23  --  25 29  GLUCOSE 151* 91  --  106* 104*  BUN <5* 5*  --  5* <5*  CREATININE 0.68 0.59  --  0.59 0.50  CALCIUM  8.7* 8.5*  --  8.4* 8.4*  MG 1.4*  --  1.5* 2.1 1.6*  PHOS  --   --   --   --  2.9   GFR: Estimated Creatinine Clearance: 77.2 mL/min (by C-G formula based on SCr of 0.5 mg/dL). Liver Function Tests: Recent Labs  Lab 08/23/24 1701 08/27/24 1854 08/28/24 0117 08/29/24 0523  AST 77* 81* 68* 55*  ALT 23 23 20 17   ALKPHOS 95 95 88 71  BILITOT 0.8 0.9 0.9 0.8  PROT 6.9 6.8 5.8* 5.8*  ALBUMIN 3.1* 2.8* 2.5* 2.2*   Recent Labs  Lab 08/23/24 1701  LIPASE 38   No results for input(s): AMMONIA in the last 168 hours. Coagulation Profile: No results for input(s): INR, PROTIME in the last 168 hours. Cardiac Enzymes: No results for input(s): CKTOTAL, CKMB, CKMBINDEX, TROPONINI in the last 168 hours. BNP (last 3 results) No results for input(s): PROBNP in the last 8760 hours. HbA1C: No results for input(s): HGBA1C in the last 72 hours. CBG: No results for input(s): GLUCAP in the last 168 hours. Lipid Profile: No results for input(s): CHOL, HDL, LDLCALC, TRIG, CHOLHDL, LDLDIRECT in the last 72  hours. Thyroid Function Tests: No results for input(s): TSH, T4TOTAL, FREET4, T3FREE, THYROIDAB in the last 72 hours. Anemia Panel: No results for input(s): VITAMINB12, FOLATE, FERRITIN, TIBC, IRON, RETICCTPCT in the last 72 hours. Sepsis Labs: No results for input(s): PROCALCITON, LATICACIDVEN in the last 168 hours.  Recent Results (from the past 240 hours)  MRSA Next Gen by PCR, Nasal     Status: None   Collection Time: 08/28/24  3:08 AM   Specimen: Nasal Mucosa; Nasal Swab  Result Value Ref Range Status   MRSA by PCR Next Gen NOT DETECTED NOT DETECTED Final    Comment: (NOTE) The GeneXpert MRSA Assay (FDA approved for NASAL specimens only), is one component of a comprehensive MRSA colonization surveillance program. It is not intended to diagnose MRSA infection nor to guide or monitor treatment for MRSA infections. Test performance is not FDA approved in patients less than 36 years old. Performed at [facility], [address] [place], [place] [address]   CSF culture w Gram Stain  Status: None (Preliminary result)   Collection Time: 08/28/24 10:44 AM   Specimen: PATH Cytology CSF; Cerebrospinal Fluid  Result Value Ref Range Status   Specimen Description CSF  Final   Special Requests NONE  Final   Gram Stain NO WBC SEEN NO ORGANISMS SEEN CYTOSPIN SMEAR   Final   Culture   Final    NO GROWTH < 24 HOURS Performed at [facility], [address], [place], [place] [address]    Report Status PENDING  Incomplete    Radiology Studies: DG Lumbar Puncture Fluoro Guide Result Date: 08/28/2024 CLINICAL DATA:  Patient admitted with ascending weakness and numbness with concern for Guillain-Barre syndrome. Diagnostic lumbar puncture requested. EXAM: LUMBAR PUNCTURE UNDER FLUOROSCOPY PROCEDURE: An appropriate skin entry site was determined fluoroscopically. Operator donned sterile gloves and mask. Skin site was marked, then prepped with Betadine,  draped in usual sterile fashion, and infiltrated locally with 1% lidocaine . A 20 gauge spinal needle advanced into the thecal sac at L2-L3. Clear colorless CSF spontaneously returned, with opening pressure of 24 cm water. 12.4 ml CSF were collected and divided among 4 sterile vials for the requested laboratory studies. Closing pressure 15 cm water The needle was then removed. The patient tolerated the procedure well and there were no complications. FLUOROSCOPY: Radiation Exposure Index (as provided by the fluoroscopic device): 26.1 mGy Kerma IMPRESSION: Technically successful lumbar puncture under fluoroscopy. This exam was performed by Warren Dais, NP, and was supervised and interpreted by Dr. Philip. Electronically Signed   By: Juliene Philip M.D.   On: 08/28/2024 11:39   MR Cervical Spine W and Wo Contrast Result Date: 08/28/2024 EXAM: MRI CERVICAL SPINE WITH AND WITHOUT CONTRAST 08/28/2024 12:25:32 AM TECHNIQUE: Multiplanar multisequence MRI of the cervical spine was performed without and with the administration of 10 mL of gadobutrol (GADAVIST) 1 mmol/mL injection. COMPARISON: None available. CLINICAL HISTORY: Myelopathy, acute, cervical spine; ?Guillain-Barr. FINDINGS: BONES AND ALIGNMENT: Normal alignment. Normal vertebral body heights. Marrow signal is unremarkable. No abnormal enhancement. SPINAL CORD: Normal spinal cord size. Normal spinal cord signal. SOFT TISSUES: No paraspinal mass. SKULL BASE: Partially empty sella turcica. C2-C3: No significant disc herniation. No spinal canal stenosis or neural foraminal narrowing. C3-C4: No significant disc herniation. No spinal canal stenosis or neural foraminal narrowing. C4-C5: No significant disc herniation. No spinal canal stenosis or neural foraminal narrowing. C5-C6: No significant disc herniation. No spinal canal stenosis or neural foraminal narrowing. C6-C7: No significant disc herniation. No spinal canal stenosis or neural foraminal narrowing. C7-T1: No  significant disc herniation. No spinal canal stenosis or neural foraminal narrowing. IMPRESSION: 1. No acute findings. Electronically signed by: Franky Stanford MD 08/28/2024 12:35 AM EST RP Workstation: [id number]   Scheduled Meds:  enoxaparin  (LOVENOX ) injection  40 mg Subcutaneous Q24H   folic acid  1 mg Oral Daily   gabapentin  200 mg Oral TID   lidocaine  1 %  5 mL Other Once   polyethylene glycol  17 g Oral BID   senna-docusate  1 tablet Oral BID   [START ON 09/05/2024] thiamine (VITAMIN B1) injection  100 mg Intravenous Daily   Continuous Infusions:  Immune Globulin 10% 207 mL/hr at 08/29/24 0735   thiamine (VITAMIN B1) injection 500 mg (08/29/24 1432)   Followed by   NOREEN ON 08/30/2024] thiamine (VITAMIN B1) injection      LOS: 1 day   Lebron JINNY Cage, MD [facility] Available via Epic secure chat 7am-7pm After these hours, please refer to coverage  provider listed on [email or web address] 08/29/2024, 2:55 PM

## 2024-08-29 NOTE — Evaluation (Addendum)
 Clinical/Bedside Swallow Evaluation Patient Details  Name: Deanna Goodwin MRN: [id number] Date of Birth: [birth year]  Today's Date: 08/29/2024 Time: SLP Start Time (ACUTE ONLY): 0740 SLP Stop Time (ACUTE ONLY): 0807 SLP Time Calculation (min) (ACUTE ONLY): 27 min  Past Medical History:  Past Medical History:  Diagnosis Date   Hypertension    Kidney stones    Seizures (HCC)    Past Surgical History:  Past Surgical History:  Procedure Laterality Date   CYSTOSCOPY WITH RETROGRADE PYELOGRAM, URETEROSCOPY AND STENT PLACEMENT Right 06/26/2014   Procedure: CYSTOSCOPY WITH STENT PLACEMENT;  Surgeon: Gretel Ferrara, MD;  Location: [facility];  Service: Urology;  Laterality: Right;   KIDNEY STONE SURGERY Left 2013/2015   LITHOTRIPSY     HPI:  Deanna Goodwin is a 63 y.o. female presenting from home on 08/27/24 with generalized aches and weakness. 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 PmHx significant for HTN, neuropathy, and seizures. SLP consulted for clinical swallow evaluation due to increasing oral weakness and concerns for reduced oral transit of solids. Observed large oral mass vs edema on hard palate. Pt aware of this oral abnormality and reported it has increased in size over the past two years.    Assessment / Plan / Recommendation  Clinical Impression:    Deanna Goodwin presents with a mild oral dysphagia per clinical swallow assessment completed today. Recommend a mechanical altered diet and thin liquids as tolerated. Recommend PO meds whole in applesauce. Will plan for MBSS for further objective assessment.   Pt received with a moderate-large amount of scrambled egg residue in oral cavity; cleared with prompted liquid washes x2-3. Main concern is large superfluous tissue  extending from bilaterally over the hard palate. This tissue appeared to challenge oral prep and transit efficiency. Pt reported this tissue abnormality has been present for about two years, though started out much smaller and has progressively increased in size. Her dentist evaluated the tissue some time ago and recommended follow up with oral surgeon; however pt did not follow up. Picture below:    OME observed to be grossly WNL, though pt does report some acute generalized oral weakness. Speech does appear to be slightly slurred. Delayed verbal response time and some possible confusion noted in session. Speech/language assessment requested per provider.   No overt or subtle s/s observed across consistencies. Pt declined dry solid trial. An MBSS is recommended given progressive weakness and concern for GBS to rule out silent aspiration and make safest, least restrictive diet recommendation.   Plan: SLP will continue to follow. Plan for MBSS tomorrow, 12/4, per radiology schedule and plan for follow up speech/language/cognitive assessment. Pt in agreement with plan.      SLP Visit Diagnosis: Dysphagia, oral phase (R13.11)    Aspiration Risk  Mild aspiration risk    Diet Recommendation Dysphagia 2 (Fine chop);Thin liquid    Liquid Administration via: Straw;Cup Medication Administration: Whole meds with puree (as tolerated; whole in puree likely easiest) Supervision: Staff to assist with self feeding Compensations: Minimize environmental distractions;Slow rate;Small sips/bites;Follow solids with liquid Postural Changes: Seated upright at 90 degrees    Other Recommendations Recommended Consults:  (needs follow up with dentist and/or oral surgeon) Oral Care Recommendations: Oral care BID      Assistance Recommended at Discharge  TBD  Functional Status Assessment  Patient has had a recent decline in their functional status and demonstrates the ability to make significant improvements in  function in a reasonable and predictable amount of time.  Frequency and Duration min 1 x/week  2 weeks       Prognosis Prognosis for improved oropharyngeal function: Good      Swallow Study   General Date of Onset: 08/27/24 HPI: Deanna Goodwin is a 63 y.o. female presenting from home on 08/27/24 with generalized aches and weakness. 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 PmHx significant for HTN, neuropathy, and seizures. SLP consulted for clinical swallow evaluation due to increasing oral weakness and concerns for reduced oral transit of solids. Observed large oral mass vs edema on hard palate. Pt aware of this oral abnormality and reported it has increased in size over the past two years. Type of Study: Bedside Swallow Evaluation Previous Swallow Assessment: none per chart Diet Prior to this Study: Regular;Thin liquids (Level 0) Temperature Spikes Noted: No Respiratory Status: Nasal cannula (2L) History of Recent Intubation: No Behavior/Cognition: Alert;Cooperative;Pleasant mood (delayed response time) Oral Cavity Assessment: Edema (vs oral mass on hard palate) Oral Cavity - Dentition: Adequate natural dentition Vision: Functional for self-feeding Self-Feeding Abilities: Needs set up (due to UE weakness) Patient Positioning: Upright in bed Baseline Vocal Quality: Normal Volitional Cough: Strong Volitional Swallow: Able to elicit    Oral/Motor/Sensory Function Overall Oral Motor/Sensory Function: Within functional limits   Ice Chips Ice chips: Not tested   Thin Liquid Thin Liquid: Impaired Oral Phase Impairments: Reduced labial seal Oral Phase Functional Implications: Prolonged oral transit    Nectar Thick Nectar Thick Liquid: Not tested   Honey Thick Honey Thick  Liquid: Not tested   Puree Puree: Impaired Oral Phase Functional Implications: Prolonged oral transit   Solid     Solid: Impaired Oral Phase Functional Implications: Oral residue;Impaired mastication;Prolonged oral transit      Ethanael Veith J Jlynn Ly 08/29/2024,8:32 AM

## 2024-08-29 NOTE — Plan of Care (Signed)
  Problem: SLP Dysphagia Goals Goal: Misc Dysphagia Goal Flowsheets (Taken 08/29/2024 0815) Misc Dysphagia Goal: Pt will tolerate least restrictive diet with adequate oral clearance, no overt or subtle s/s of aspiration and no concerns for pulmonary compromise.

## 2024-08-29 NOTE — Progress Notes (Signed)
 RT obtained NIF and VC on patient with good patient effort.   NIF -38 VC 1.42 L

## 2024-08-29 NOTE — Progress Notes (Signed)
   Inpatient Rehabilitation Admissions Coordinator   I will place rehab consult for full assessment for possible CIR admit.  Heron Leavell, RN, MSN Rehab Admissions Coordinator [phone] 08/29/2024 8:56 AM

## 2024-08-29 NOTE — Plan of Care (Signed)
  Problem: Education: Goal: Knowledge of General Education information will improve Description Including pain rating scale, medication(s)/side effects and non-pharmacologic comfort measures Outcome: Progressing   Problem: Clinical Measurements: Goal: Cardiovascular complication will be avoided Outcome: Progressing   Problem: Safety: Goal: Ability to remain free from injury will improve Outcome: Progressing   

## 2024-08-29 NOTE — Progress Notes (Signed)
 NIF > -40 VC : 1.54 L  Great patient effort.

## 2024-08-29 NOTE — Progress Notes (Signed)
 NEUROLOGY CONSULT FOLLOW UP NOTE   Date of service: August 29, 2024 Patient Name: Deanna Goodwin MRN:  [id number] DOB:  11/09/1960  Interval Hx/subjective    NiF with great effort this AM.   Patient c/o pain to bilateral feet. Denies any complication with first IVIG administration.   Vitals   Vitals:   08/28/24 2100 08/28/24 2247 08/29/24 0208 08/29/24 0300  BP: (!) 153/80 (!) 146/66  (!) 153/95  Pulse: 78 86 77 82  Resp: 17 17 12 16   Temp:  98.2 F (36.8 C)  98.3 F (36.8 C)  TempSrc:  Oral  Oral  SpO2: 99% 99% 98% 99%  Weight:   86.8 kg   Height:         Body mass index is 33.9 kg/m.  Physical Exam   Constitutional: Appears well-developed and well-nourished.  Psych: Affect blunted Eyes: No scleral injection.  HENT: No OP obstrucion.  Head: Normocephalic.  Cardiovascular: Normal rate and regular rhythm.  Respiratory: Effort normal, non-labored breathing.  Skin: WDI.   Neurologic Examination    NEURO:  Mental Status: Patient is alert and oriented to person to place time and situation but does have some forgetfulness Speech/Language: speech is with very mild dysarthria and no aphasia  Cranial Nerves:  II: PERRL.  III, IV, VI: EOMI. Eyelids elevate symmetrically.  V: Sensation is intact to light touch and symmetrical to face.  VII: Smile is symmetrical.  VIII: hearing intact to voice. IX, X: Voice is mildly dysarthric XII: tongue is midline without fasciculations. Motor:  4 -/5 strength to bilateral upper extremities, proximal and distal,  3/5 strength hip flexion, 2/5 strength knee flexion, knee extension, dorsiflexion and plantarflexion Tone: is normal and bulk is normal Sensation- Intact to light touch bilaterally but diminished in bilateral upper and lower extremities, worse in the hands and feet and improving at knee and upper arm level DTRs: Absent Gait- deferred   Medications  Current Facility-Administered Medications:    0.9 %  sodium  chloride infusion, , Intravenous, Continuous, Sheikh, Omair Latif, DO, Last Rate: 75 mL/hr at 08/28/24 2300, Infusion Verify at 08/28/24 2300   acetaminophen  (TYLENOL ) tablet 650 mg, 650 mg, Oral, Q6H PRN, 650 mg at 08/29/24 0651 **OR** acetaminophen  (TYLENOL ) suppository 650 mg, 650 mg, Rectal, Q6H PRN, Howerter, Justin B, DO   folic acid (FOLVITE) tablet 1 mg, 1 mg, Oral, Daily, Stephan Draughn, MD, 1 mg at 08/28/24 9180   ibuprofen  (ADVIL ) tablet 600 mg, 600 mg, Oral, Q6H PRN, Sheikh, Omair Latif, DO, 600 mg at 08/28/24 2346   Immune Globulin 10% (PRIVIGEN) IV infusion 25 g, 400 mg/kg (Adjusted), Intravenous, Q24 Hr x 5, Del Overfelt, MD, Last Rate: 207 mL/hr at 08/28/24 2300, Infusion Verify at 08/28/24 2300   lidocaine  (XYLOCAINE ) injection 1 % - NO CHARGE, 5 mL, Other, Once, Covington, Jamie R, NP   magnesium  sulfate IVPB 2 g 50 mL, 2 g, Intravenous, Once, Ezenduka, Nkeiruka J, MD   melatonin tablet 3 mg, 3 mg, Oral, QHS PRN, Howerter, Justin B, DO, 3 mg at 08/28/24 2118   nicotine (NICODERM CQ - dosed in mg/24 hours) patch 14 mg, 14 mg, Transdermal, Daily PRN, Howerter, Justin B, DO   nicotine polacrilex (NICORETTE) gum 2 mg, 2 mg, Oral, PRN, Howerter, Justin B, DO   ondansetron  (ZOFRAN ) injection 4 mg, 4 mg, Intravenous, Q6H PRN, Howerter, Justin B, DO   polyethylene glycol (MIRALAX / GLYCOLAX) packet 17 g, 17 g, Oral, BID, Sheikh, Omair Latif, DO, 17 g  at 08/28/24 2118   senna-docusate (Senokot-S) tablet 1 tablet, 1 tablet, Oral, BID, Sherrill Cable [place], [place], 1 tablet at 08/28/24 2118   thiamine (VITAMIN B1) 500 mg in sodium chloride  0.9 % 50 mL IVPB, 500 mg, Intravenous, Q8H, Last Rate: 110 mL/hr at 08/29/24 0600, 500 mg at 08/29/24 0600 **FOLLOWED BY** [START ON 08/30/2024] thiamine (VITAMIN B1) 250 mg in sodium chloride  0.9 % 50 mL IVPB, 250 mg, Intravenous, Daily **FOLLOWED BY** [START ON 09/05/2024] thiamine (VITAMIN B1) injection 100 mg, 100 mg, Intravenous, Daily, Elleana Stillson,  Sherrine Salberg, MD  Labs and Diagnostic Imaging   CBC:  Recent Labs  Lab 08/28/24 0117 08/29/24 0523  WBC 7.1 4.2  NEUTROABS 5.3 2.8  HGB 13.4 12.1  HCT 40.0 35.5*  MCV 102.0* 98.9  PLT 196 187    Basic Metabolic Panel:  Lab Results  Component Value Date   NA 136 08/29/2024   K 4.4 08/29/2024   CO2 29 08/29/2024   GLUCOSE 104 (H) 08/29/2024   BUN <5 (L) 08/29/2024   CREATININE 0.50 08/29/2024   CALCIUM  8.4 (L) 08/29/2024   GFRNONAA >60 08/29/2024   GFRAA >60 10/16/2017   Lipid Panel:  Lab Results  Component Value Date   LDLCALC 87 12/20/2016   HgbA1c: No results found for: HGBA1C Urine Drug Screen:     Component Value Date/Time   LABOPIA NONE DETECTED 08/27/2024 2014   COCAINSCRNUR NONE DETECTED 08/27/2024 2014   LABBENZ NONE DETECTED 08/27/2024 2014   AMPHETMU NONE DETECTED 08/27/2024 2014   THCU POSITIVE (A) 08/27/2024 2014   LABBARB NONE DETECTED 08/27/2024 2014    Alcohol Level     Component Value Date/Time   [facility] <15 08/27/2024 2100    MRI Brain and cervical spine (Personally reviewed): No acute abnormalities   Assessment   Deanna Goodwin is a 63 y.o. female with history of hypertension who presents with ascending weakness and numbness of bilateral upper and lower extremities.  Patient reports that symptoms started about a week ago and have been steadily increasing.  She does report some difficulty swallowing and speaking as well as mild dyspnea.  Given ascending weakness and numbness as well as areflexia, Guillan Barre syndrome is high on the differential.  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  Recommendations  - Every 12 hour NIF and vital capacity - Continue treatment with IVIG 400mg /Kg daily x 5 doses. Ends 12/6.  - Neurology will continue to follow - will use gabapentin PO for neuropathic pain along with lidocaine  cream. Will use one time morphine  for breakthrough right now. -  Recommend avoiding SCDs as the compressions can be quite uncomfortable in patients with neuropathic pain. ______________________________________________________________________   NEUROHOSPITALIST ADDENDUM Performed a face to face diagnostic evaluation.   I have reviewed the contents of history and physical exam as documented by PA/ARNP/Resident and agree with above documentation.  I have discussed and formulated the above plan as documented. Edits to the note have been made as needed.  Plan discussed with Dr. Ezenduka with the Hospitalist team over phone. Plan was also discussed with patient at the bedside.  Kazaria Gaertner, MD [facility] [phone]   If 7pm to 7am, please call on call as listed on AMION.

## 2024-08-30 ENCOUNTER — Inpatient Hospital Stay (HOSPITAL_COMMUNITY)

## 2024-08-30 DIAGNOSIS — R531 Weakness: Secondary | ICD-10-CM | POA: Diagnosis not present

## 2024-08-30 LAB — CBC WITH DIFFERENTIAL/PLATELET
Abs Immature Granulocytes: 0.02 K/uL (ref 0.00–0.07)
Basophils Absolute: 0 K/uL (ref 0.0–0.1)
Basophils Relative: 0 %
Eosinophils Absolute: 0.1 K/uL (ref 0.0–0.5)
Eosinophils Relative: 1 %
HCT: 35.8 % — ABNORMAL LOW (ref 36.0–46.0)
Hemoglobin: 12.4 g/dL (ref 12.0–15.0)
Immature Granulocytes: 0 %
Lymphocytes Relative: 25 %
Lymphs Abs: 1.4 K/uL (ref 0.7–4.0)
MCH: 34.3 pg — ABNORMAL HIGH (ref 26.0–34.0)
MCHC: 34.6 g/dL (ref 30.0–36.0)
MCV: 99.2 fL (ref 80.0–100.0)
Monocytes Absolute: 0.5 K/uL (ref 0.1–1.0)
Monocytes Relative: 9 %
Neutro Abs: 3.5 K/uL (ref 1.7–7.7)
Neutrophils Relative %: 65 %
Platelets: 186 K/uL (ref 150–400)
RBC: 3.61 MIL/uL — ABNORMAL LOW (ref 3.87–5.11)
RDW: 13.2 % (ref 11.5–15.5)
WBC: 5.4 K/uL (ref 4.0–10.5)
nRBC: 0 % (ref 0.0–0.2)

## 2024-08-30 LAB — COMPREHENSIVE METABOLIC PANEL WITH GFR
ALT: 18 U/L (ref 0–44)
AST: 55 U/L — ABNORMAL HIGH (ref 15–41)
Albumin: 2.2 g/dL — ABNORMAL LOW (ref 3.5–5.0)
Alkaline Phosphatase: 70 U/L (ref 38–126)
Anion gap: 11 (ref 5–15)
BUN: 5 mg/dL — ABNORMAL LOW (ref 8–23)
CO2: 27 mmol/L (ref 22–32)
Calcium: 8.5 mg/dL — ABNORMAL LOW (ref 8.9–10.3)
Chloride: 98 mmol/L (ref 98–111)
Creatinine, Ser: 0.54 mg/dL (ref 0.44–1.00)
GFR, Estimated: >60 mL/min (ref >60)
Glucose, Bld: 96 mg/dL (ref 70–99)
Potassium: 4.2 mmol/L (ref 3.5–5.1)
Sodium: 136 mmol/L (ref 135–145)
Total Bilirubin: 0.7 mg/dL (ref 0.0–1.2)
Total Protein: 6.5 g/dL (ref 6.5–8.1)

## 2024-08-30 LAB — MAGNESIUM: Magnesium: 1.7 mg/dL (ref 1.7–2.4)

## 2024-08-30 LAB — VDRL, CSF: VDRL Quant, CSF: NONREACTIVE

## 2024-08-30 MED ORDER — MORPHINE SULFATE (PF) 2 MG/ML IV SOLN
2.0000 mg | Freq: Once | INTRAVENOUS | Status: DC
  Filled 2024-08-30 (×2): qty 1

## 2024-08-30 MED ORDER — CYCLOBENZAPRINE HCL 10 MG PO TABS
5.0000 mg | ORAL_TABLET | Freq: Three times a day (TID) | ORAL | Status: DC | PRN
  Administered 2024-08-30 – 2024-09-03 (×6): 5 mg via ORAL
  Filled 2024-08-30 (×6): qty 1

## 2024-08-30 MED ORDER — GABAPENTIN 300 MG PO CAPS
300.0000 mg | ORAL_CAPSULE | Freq: Three times a day (TID) | ORAL | Status: DC
  Administered 2024-08-30 – 2024-08-31 (×4): 300 mg via ORAL
  Filled 2024-08-30 (×5): qty 1

## 2024-08-30 MED ORDER — GABAPENTIN 300 MG PO CAPS
300.0000 mg | ORAL_CAPSULE | Freq: Every day | ORAL | Status: DC
  Administered 2024-08-30 – 2024-08-31 (×2): 300 mg via ORAL
  Filled 2024-08-30 (×2): qty 1

## 2024-08-30 MED ORDER — MORPHINE SULFATE (PF) 2 MG/ML IV SOLN
1.0000 mg | Freq: Once | INTRAVENOUS | Status: AC
  Administered 2024-08-30: 1 mg via INTRAVENOUS
  Filled 2024-08-30: qty 1

## 2024-08-30 MED ORDER — LIDOCAINE 4 % EX CREA
TOPICAL_CREAM | Freq: Two times a day (BID) | CUTANEOUS | Status: DC
  Administered 2024-08-31 – 2024-09-05 (×7): 1 via TOPICAL
  Filled 2024-08-30 (×3): qty 5

## 2024-08-30 MED ORDER — GABAPENTIN 300 MG PO CAPS
300.0000 mg | ORAL_CAPSULE | Freq: Three times a day (TID) | ORAL | Status: DC
  Administered 2024-08-30: 300 mg via ORAL
  Filled 2024-08-30: qty 1

## 2024-08-30 NOTE — Progress Notes (Signed)
 PROGRESS NOTE    Deanna Goodwin  FMW:[id number] DOB: Jun 20, 1961 DOA: 08/27/2024 PCP: Tanda Bleacher, MD    Brief Narrative:  63 year old female with a past medical history significant for but not limited to hypertension history of seizures, and history of nephrolithiasis status post ureteroscopy and stent placement with lithotripsy who presents with a 1 week history of ascending weakness involving all 4 extremities with primarily in bilateral upper extremities. Pt was recently seen in the ED on 11/27 with similar symptoms and brain MRI done showed no acute process and no evidence of infarct. Given her continued worsening and generalized weakness she presented again and neurology was consulted and recommended MRI C-spine which was negative.  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  Neurology on board, started patient on IVIG.    Today, patient continues to complain of shooting/cramping/neuropathic-like pain in her lower extremities, some in her bilateral hands as well.  Denies any other new complaints, difficulty breathing, chest pains, worsening weakness.   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 MRI cervical spine showed evidence of acute cervical spine process MRI brain performed on 08/23/2024 shows no evidence of acute ischemic infarct Neurology on board, s/p LP under fluoroscopy by IR on 12/2 showed albumin no cytologic dissociation consistent with GBS, other results pending Started patient on IVIG on 12/2 Continue gabapentin, topical lidocaine , added on Flexeril as needed Closely monitor serial NIF/VC q12h with RT consult Requiring [place] O2 for comfort as no hypoxia has been noted PT/OT recommending CIR  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  Noted bilateral nonobstructing nephrolithiasis  Essential Hypertension Initially BP soft, currently uncontrolled possibly due to generalized pain versus IVIG  Hydralazine IV as needed  Chronic Tobacco Abuse Smokes a pack per day for nearly 40 years Advised to quit  C/w Nicotine 14 mg TD Patch and Nicotine Prolacrilex 2 mg po as needed   Hypoalbuminemia Follow-up   Class I Obesity Lifestyle modification advised    DVT prophylaxis: enoxaparin  (LOVENOX ) injection 40 mg Start: 08/29/24 1045 SCDs Start: 08/28/24 0008    Code Status: Full Code Family Communication: None at bedside  Disposition Plan:  Level of care: Progressive Status is: Inpatient Remains inpatient appropriate because: Level of care  Consultants:  Neurology  Procedures:  LP on 12/2  Antimicrobials:  Anti-infectives (From admission, onward)    None        Objective: Vitals:   08/30/24 1530 08/30/24 1546 08/30/24 1600 08/30/24 1619  BP: (!) 159/84  (!) 160/83 (!) 147/94  Pulse: 95     Resp: 19   16  Temp: 97.9 F (36.6 C) 98 F (36.7 C) 98 F (36.7 C) 98.2 F (36.8 C)  TempSrc: Oral Oral Oral Oral  SpO2: 100%     Weight:      Height:        Intake/Output Summary (Last 24 hours) at 08/30/2024 1708 Last data filed at 08/30/2024 1121 Gross per 24 hour  Intake 664.46 ml  Output 3100 ml  Net -  2435.54 ml   Filed Weights   08/28/24 0533 08/29/24 0208 08/30/24 0458  Weight: 86.2 kg 86.8 kg 86 kg   Examination: Physical Exam: General: NAD  Cardiovascular: S1, S2 present Respiratory:  Diminished breath sounds bilaterally, no accessory muscle use Abdomen: Soft, nontender, nondistended, bowel sounds present Musculoskeletal: No bilateral pedal edema noted Skin: Normal Psychiatry: Normal mood  Neurology: Noted extremity weakness/numbness, otherwise no obvious neurologic deficits noted    Data Reviewed: I have personally reviewed following labs and imaging studies  CBC: Recent Labs  Lab 08/27/24 1854 08/28/24 0117 08/29/24 0523 08/30/24 0327  WBC 7.0 7.1 4.2 5.4  NEUTROABS  --  5.3 2.8 3.5  HGB 15.2* 13.4 12.1 12.4  HCT 47.4* 40.0 35.5* 35.8*  MCV 107.7* 102.0* 98.9 99.2  PLT 215 196 187 186   Basic Metabolic Panel: Recent Labs  Lab 08/27/24 1854 08/27/24 2100 08/28/24 0117 08/29/24 0523 08/30/24 0327  NA 134*  --  136 136 136  K 3.3*  --  3.4* 4.4 4.2  CL 96*  --  93* 96* 98  CO2 23  --  25 29 27   GLUCOSE 91  --  106* 104* 96  BUN 5*  --  5* <5* 5*  CREATININE 0.59  --  0.59 0.50 0.54  CALCIUM  8.5*  --  8.4* 8.4* 8.5*  MG  --  1.5* 2.1 1.6* 1.7  PHOS  --   --   --  2.9  --    GFR: Estimated Creatinine Clearance: 76.7 mL/min (by C-G formula based on SCr of 0.54 mg/dL). Liver Function Tests: Recent Labs  Lab 08/27/24 1854 08/28/24 0117 08/29/24 0523 08/30/24 0327  AST 81* 68* 55* 55*  ALT 23 20 17 18   ALKPHOS 95 88 71 70  BILITOT 0.9 0.9 0.8 0.7  PROT 6.8 5.8* 5.8* 6.5  ALBUMIN 2.8* 2.5* 2.2* 2.2*   No results for input(s): LIPASE, AMYLASE in the last 168 hours.  No results for input(s): AMMONIA in the last 168 hours. Coagulation Profile: No results for input(s): INR, PROTIME in the last 168 hours. Cardiac Enzymes: No results for input(s): CKTOTAL, CKMB, CKMBINDEX, TROPONINI in the last 168 hours. BNP (last 3 results) No results for input(s): PROBNP in the last 8760 hours. HbA1C: No results for input(s): HGBA1C in the last 72 hours. CBG: No results for input(s): GLUCAP in the last 168 hours. Lipid  Profile: No results for input(s): CHOL, HDL, LDLCALC, TRIG, CHOLHDL, LDLDIRECT in the last 72 hours. Thyroid Function Tests: No results for input(s): TSH, T4TOTAL, FREET4, T3FREE, THYROIDAB in the last 72 hours. Anemia Panel: No results for input(s): VITAMINB12, FOLATE, FERRITIN, TIBC, IRON, RETICCTPCT in the last 72 hours. Sepsis Labs: No results for input(s): PROCALCITON, LATICACIDVEN in the last 168 hours.  Recent Results (from the past 240 hours)  MRSA Next Gen by PCR, Nasal     Status: None   Collection Time: 08/28/24  3:08 AM   Specimen: Nasal Mucosa; Nasal Swab  Result Value Ref Range Status   MRSA by PCR Next Gen NOT DETECTED NOT DETECTED Final    Comment: (NOTE) The GeneXpert MRSA Assay (FDA approved for NASAL specimens only), is one component of a comprehensive MRSA colonization surveillance program. It is not intended to diagnose MRSA infection nor to guide or monitor treatment for MRSA infections. Test performance is not FDA approved in patients less than 72 years old. Performed at [facility], [address] [place], [place] [address]   CSF  culture w Gram Stain     Status: None (Preliminary result)   Collection Time: 08/28/24 10:44 AM   Specimen: PATH Cytology CSF; Cerebrospinal Fluid  Result Value Ref Range Status   Specimen Description CSF  Final   Special Requests NONE  Final   Gram Stain NO WBC SEEN NO ORGANISMS SEEN CYTOSPIN SMEAR   Final   Culture   Final    NO GROWTH 2 DAYS Performed at [facility], [address] [place], [place] [address]    Report Status PENDING  Incomplete    Radiology Studies: CT MAXILLOFACIAL W CONTRAST Result Date: 08/30/2024 EXAM: CT Face with contrast 08/29/2024 10:17:01 PM TECHNIQUE: CT of the face was performed with the administration of 75 mL of iohexol  (OMNIPAQUE ) 350 MG/ML injection. Multiplanar reformatted images are provided for review. Automated exposure control,  iterative reconstruction, and/or weight based adjustment of the mA/kV was utilized to reduce the radiation dose to as low as reasonably achievable. 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 ??large tonsils. FINDINGS: AERODIGESTIVE TRACT: No tonsillar enlargement. No mass. No edema. SALIVARY GLANDS: No acute abnormality. LYMPH NODES: No suspicious cervical lymphadenopathy. SOFT TISSUES: No mass or fluid collection. BRAIN, ORBITS AND SINUSES: No acute abnormality. BONES: Large tori palatini. No acute abnormality. No suspicious bone lesion. IMPRESSION: 1. Large tori palatini, likely accounting for the visualized oral cavity mass. 2. No tonsillar enlargement. Electronically signed by: Franky Stanford MD 08/30/2024 01:00 AM EST RP Workstation: [id number]   CT ABDOMEN PELVIS W CONTRAST Result Date: 08/30/2024 EXAM: CT ABDOMEN AND PELVIS WITH CONTRAST 08/29/2024 10:17:01 PM TECHNIQUE: CT of the abdomen and pelvis was performed with the administration of 75 mL of iohexol  (OMNIPAQUE ) 350 MG/ML injection. Multiplanar reformatted images are provided for review. Automated exposure control, iterative reconstruction, and/or weight-based adjustment of the mA/kV was utilized to reduce the radiation dose to as low as reasonably achievable. COMPARISON: 08/18/2024 CLINICAL HISTORY: Recent acute pancreatitis. Repeat CT to rule out focal pancreatic lesion. FINDINGS: LOWER CHEST: Left lower lobe atelectasis and trace left pleural effusion. LIVER: Focal fat deposition along the falciform ligament. GALLBLADDER AND BILE DUCTS: Gallbladder is unremarkable. No biliary ductal dilatation. SPLEEN: No acute abnormality. PANCREAS: Decreased peripancreatic edema and stranding compared to 08/18/2024, compatible with improving pancreatitis. Complex fluid collection about the tail of the pancreas extending to the splenic hilum is slightly decreased in size now measuring 5.1 x 3.5 cm in the axial plane,  previously 6.7 x 3.7 cm using similar technique. No pancreatic necrosis. No pancreatic ductal dilation. No CT evidence of pancreatic mass. Improving interstitial pancreatitis. ADRENAL GLANDS: No acute abnormality. KIDNEYS, URETERS AND BLADDER: Large calculus in the upper pole of the right kidney. Punctate nonobstructing calculi in the lower pole of the left kidney. No hydronephrosis. No perinephric or periureteral stranding. Urinary bladder is unremarkable. GI AND BOWEL: Stomach demonstrates no acute abnormality. Wall thickening, mucosal hyperenhancement, and pericolonic fat stranding about the ascending colon and cecum. There is no bowel obstruction. No pneumatosis. PERITONEUM AND RETROPERITONEUM: No free air. VASCULATURE: Aorta is normal in caliber. LYMPH NODES: No lymphadenopathy. REPRODUCTIVE ORGANS: Calcified uterine fibroid. BONES AND SOFT TISSUES: No acute osseous abnormality. No focal soft tissue abnormality. IMPRESSION: 1. Nonspecific colitis of the cecum and ascending colon, likely infectious/inflammatory. Ischemic colitis is considered less likely but not excluded. 2. Improving interstitial pancreatitis. No pancreatic necrosis or ductal dilation. No CT evidence of pancreatic mass. 3. Bilateral nonobstructing nephrolithiasis. Electronically signed by: Norman Gatlin MD 08/30/2024  12:27 AM EST RP Workstation: [id number]   Scheduled Meds:  enoxaparin  (LOVENOX ) injection  40 mg Subcutaneous Q24H   folic acid  1 mg Oral Daily   gabapentin  300 mg Oral TID   lidocaine    Topical BID   lidocaine  1 %  5 mL Other Once    morphine  injection  2 mg Intravenous Once   polyethylene glycol  17 g Oral BID   senna-docusate  1 tablet Oral BID   [START ON 09/05/2024] thiamine (VITAMIN B1) injection  100 mg Intravenous Daily   Continuous Infusions:  Immune Globulin 10% 158 mL/hr at 08/30/24 1602   thiamine (VITAMIN B1) injection 250 mg (08/30/24 1222)    LOS: 2 days   Lebron JINNY Cage, MD [facility] Available via Epic secure chat 7am-7pm After these hours, please refer to coverage provider listed on [email or web address] 08/30/2024, 5:08 PM

## 2024-08-30 NOTE — Progress Notes (Signed)
 NIF and VC as follows: VC best out of 3 is 1.3L NIF >--40 cmH2o

## 2024-08-30 NOTE — Progress Notes (Signed)
   Inpatient Rehabilitation Admissions Coordinator   Met with patient at bedside briefly. Receiving first dose IVIG. She is not awake enough for further discussion. I will follow up.  Heron Leavell, RN, MSN Rehab Admissions Coordinator [phone] 08/30/2024 3:44 PM

## 2024-08-30 NOTE — Progress Notes (Signed)
 PT Cancellation Note  Patient Details Name: Deanna Goodwin MRN: [id number] DOB: September 29, 1960   Cancelled Treatment:    Reason Eval/Treat Not Completed: (P) Patient at procedure or test/unavailable;Other (comment) (Pt at MBS, then IVIG infusing, RN defer PT session until after infusion complete ~4:30pm. Will continue efforts, likely next date per PT plan of care as schedule permits.)   [name] Blue 08/30/2024, 3:44 PM

## 2024-08-30 NOTE — Progress Notes (Signed)
 NEUROLOGY CONSULT FOLLOW UP NOTE   Date of service: August 30, 2024 Patient Name: Deanna Goodwin MRN:  [id number] DOB:  [birth year]  Interval Hx/subjective   She continues to complain of bilateral lower extremity pain specifically in her feet.  The pain seems to be most consistent with neuropathic pain.  She does not feel the gabapentin helped her whole lot.  She reports that she was not offered lidocaine  cream.  No significant change noted in her exam today.  Vitals   Vitals:   08/30/24 1530 08/30/24 1546 08/30/24 1600 08/30/24 1619  BP: (!) 159/84  (!) 160/83 (!) 147/94  Pulse: 95     Resp: 19   16  Temp: 97.9 F (36.6 C) 98 F (36.7 C) 98 F (36.7 C) 98.2 F (36.8 C)  TempSrc: Oral Oral Oral Oral  SpO2: 100%     Weight:      Height:         Body mass index is 33.59 kg/m.  Physical Exam   Constitutional: Appears well-developed and well-nourished.  Psych: Affect blunted Eyes: No scleral injection.  HENT: No OP obstrucion.  Head: Normocephalic.  Cardiovascular: Normal rate and regular rhythm.  Respiratory: Effort normal, non-labored breathing.  Skin: WDI.   Neurologic Examination    NEURO:  Mental Status: Patient is alert and oriented to person to place time and situation but does have some forgetfulness Speech/Language: speech is with very mild dysarthria and no aphasia  Cranial Nerves:  II: PERRL.  III, IV, VI: EOMI. Eyelids elevate symmetrically.  V: Sensation is intact to light touch and symmetrical to face.  VII: Smile is symmetrical.  VIII: hearing intact to voice. IX, X: Voice is mildly dysarthric XII: tongue is midline without fasciculations. Motor:  4 -/5 strength to bilateral upper extremities, proximal and distal,  3/5 strength hip flexion, 2/5 strength knee flexion, knee extension, dorsiflexion and plantarflexion Tone: is normal and bulk is normal Sensation- Intact to light touch bilaterally but diminished in bilateral upper and lower  extremities, worse in the hands and feet and improving at knee and upper arm level DTRs: Absent Gait- deferred   Medications  Current Facility-Administered Medications:    acetaminophen  (TYLENOL ) tablet 650 mg, 650 mg, Oral, Q6H PRN, 650 mg at 08/30/24 0321 **OR** acetaminophen  (TYLENOL ) suppository 650 mg, 650 mg, Rectal, Q6H PRN, Howerter, Justin B, DO   cyclobenzaprine (FLEXERIL) tablet 5 mg, 5 mg, Oral, TID PRN, Ezenduka, Nkeiruka J, MD, 5 mg at 08/30/24 1218   enoxaparin  (LOVENOX ) injection 40 mg, 40 mg, Subcutaneous, Q24H, Ezenduka, Nkeiruka J, MD, 40 mg at 08/30/24 1218   folic acid (FOLVITE) tablet 1 mg, 1 mg, Oral, Daily, Renesmee Raine, MD, 1 mg at 08/30/24 0814   gabapentin (NEURONTIN) capsule 300 mg, 300 mg, Oral, TID, Ezenduka, Nkeiruka J, MD, 300 mg at 08/30/24 1542   hydrALAZINE (APRESOLINE) injection 10 mg, 10 mg, Intravenous, Q8H PRN, Ezenduka, Nkeiruka J, MD   ibuprofen  (ADVIL ) tablet 600 mg, 600 mg, Oral, Q6H PRN, Sheikh, Omair Latif, DO, 600 mg at 08/30/24 9185   Immune Globulin 10% (PRIVIGEN) IV infusion 25 g, 400 mg/kg (Adjusted), Intravenous, Q24 Hr x 5, Caylin Nass, MD, Last Rate: 158 mL/hr at 08/30/24 1602, Rate Change at 08/30/24 1602   lidocaine  (LMX) 4 % cream, , Topical, BID, Guillermo Difrancesco, MD   lidocaine  (XYLOCAINE ) injection 1 % - NO CHARGE, 5 mL, Other, Once, Covington, Jamie R, NP   melatonin tablet 3 mg, 3 mg, Oral, QHS PRN, Howerter,  Eva NOVAK, DO, 3 mg at 08/28/24 2118   morphine  (PF) 2 MG/ML injection 2 mg, 2 mg, Intravenous, Once, Refugio Vandevoorde, MD   nicotine (NICODERM CQ - dosed in mg/24 hours) patch 14 mg, 14 mg, Transdermal, Daily PRN, Howerter, Justin B, DO   nicotine polacrilex (NICORETTE) gum 2 mg, 2 mg, Oral, PRN, Howerter, Justin B, DO   ondansetron  (ZOFRAN ) injection 4 mg, 4 mg, Intravenous, Q6H PRN, Howerter, Justin B, DO   polyethylene glycol (MIRALAX / GLYCOLAX) packet 17 g, 17 g, Oral, BID, Sheikh, Omair Latif, DO, 17 g at  08/30/24 0815   senna-docusate (Senokot-S) tablet 1 tablet, 1 tablet, Oral, BID, Sherrill Cable Latif, DO, 1 tablet at 08/30/24 9185   [COMPLETED] thiamine (VITAMIN B1) 500 mg in sodium chloride  0.9 % 50 mL IVPB, 500 mg, Intravenous, Q8H, Last Rate: 110 mL/hr at 08/29/24 2152, 500 mg at 08/29/24 2152 **FOLLOWED BY** thiamine (VITAMIN B1) 250 mg in sodium chloride  0.9 % 50 mL IVPB, 250 mg, Intravenous, Daily, Last Rate: 105 mL/hr at 08/30/24 1222, 250 mg at 08/30/24 1222 **FOLLOWED BY** [START ON 09/05/2024] thiamine (VITAMIN B1) injection 100 mg, 100 mg, Intravenous, Daily, Vanessa Robert, MD  Labs and Diagnostic Imaging   CBC:  Recent Labs  Lab 08/29/24 0523 08/30/24 0327  WBC 4.2 5.4  NEUTROABS 2.8 3.5  HGB 12.1 12.4  HCT 35.5* 35.8*  MCV 98.9 99.2  PLT 187 186    Basic Metabolic Panel:  Lab Results  Component Value Date   NA 136 08/30/2024   K 4.2 08/30/2024   CO2 27 08/30/2024   GLUCOSE 96 08/30/2024   BUN 5 (L) 08/30/2024   CREATININE 0.54 08/30/2024   CALCIUM  8.5 (L) 08/30/2024   GFRNONAA >60 08/30/2024   GFRAA >60 10/16/2017   Lipid Panel:  Lab Results  Component Value Date   LDLCALC 87 12/20/2016   HgbA1c: No results found for: HGBA1C Urine Drug Screen:     Component Value Date/Time   LABOPIA NONE DETECTED 08/27/2024 2014   COCAINSCRNUR NONE DETECTED 08/27/2024 2014   LABBENZ NONE DETECTED 08/27/2024 2014   AMPHETMU NONE DETECTED 08/27/2024 2014   THCU POSITIVE (A) 08/27/2024 2014   LABBARB NONE DETECTED 08/27/2024 2014    Alcohol Level     Component Value Date/Time   [facility] <15 08/27/2024 2100    MRI Brain and cervical spine (Personally reviewed): No acute abnormalities   Assessment   Deanna Goodwin is a 63 y.o. female with history of hypertension who presents with ascending weakness and numbness of bilateral upper and lower extremities.  Patient reports that symptoms started about a week ago and have been steadily increasing.  She does report  some difficulty swallowing and speaking as well as mild dyspnea.  Given ascending weakness and numbness as well as areflexia, Guillan Barre syndrome is high on the differential.  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  Recommendations  - Every 12 hour NIF and vital capacity - Continue treatment with IVIG 400mg /Kg daily x 5 doses. Ends 12/6.  - Neurology will continue to follow - will increase night time gabapentin to 600 and continue current AM and evening dose.  Lidocaine  cream ordered as twice daily scheduled.  Will use one time morphine  for breakthrough right now. ______________________________________________________________________   Eligio Angert, MD [facility] [phone]   If 7pm to 7am, please call on call as listed on AMION.

## 2024-08-30 NOTE — Plan of Care (Signed)
   Problem: Education: Goal: Knowledge of General Education information will improve Description Including pain rating scale, medication(s)/side effects and non-pharmacologic comfort measures Outcome: Progressing

## 2024-08-31 DIAGNOSIS — R531 Weakness: Secondary | ICD-10-CM | POA: Diagnosis not present

## 2024-08-31 LAB — COMPREHENSIVE METABOLIC PANEL WITH GFR
ALT: 17 U/L (ref 0–44)
AST: 65 U/L — ABNORMAL HIGH (ref 15–41)
Albumin: 2 g/dL — ABNORMAL LOW (ref 3.5–5.0)
Alkaline Phosphatase: 82 U/L (ref 38–126)
Anion gap: 6 (ref 5–15)
BUN: 5 mg/dL — ABNORMAL LOW (ref 8–23)
CO2: 33 mmol/L — ABNORMAL HIGH (ref 22–32)
Calcium: 8.4 mg/dL — ABNORMAL LOW (ref 8.9–10.3)
Chloride: 99 mmol/L (ref 98–111)
Creatinine, Ser: 0.56 mg/dL (ref 0.44–1.00)
GFR, Estimated: >60 mL/min (ref >60)
Glucose, Bld: 102 mg/dL — ABNORMAL HIGH (ref 70–99)
Potassium: 4.2 mmol/L (ref 3.5–5.1)
Sodium: 138 mmol/L (ref 135–145)
Total Bilirubin: 0.5 mg/dL (ref 0.0–1.2)
Total Protein: 6.9 g/dL (ref 6.5–8.1)

## 2024-08-31 LAB — CSF CULTURE W GRAM STAIN
Culture: NO GROWTH
Gram Stain: NONE SEEN

## 2024-08-31 LAB — MAGNESIUM: Magnesium: 1.6 mg/dL — ABNORMAL LOW (ref 1.7–2.4)

## 2024-08-31 MED ORDER — MAGNESIUM SULFATE 4 GM/100ML IV SOLN
4.0000 g | Freq: Once | INTRAVENOUS | Status: AC
  Administered 2024-08-31: 4 g via INTRAVENOUS
  Filled 2024-08-31: qty 100

## 2024-08-31 MED ORDER — ENSURE PLUS HIGH PROTEIN PO LIQD
237.0000 mL | Freq: Two times a day (BID) | ORAL | Status: DC
  Administered 2024-08-31 – 2024-09-06 (×6): 237 mL via ORAL

## 2024-08-31 NOTE — Plan of Care (Signed)

## 2024-08-31 NOTE — TOC Progression Note (Signed)
 Transition of Care [facility]) - Progression Note    Patient Details  Name: Deanna Goodwin MRN: [id number] Date of Birth: Jun 23, 1961  [facility] CM/SW Contact  Lauraine FORBES Saa, LCSWA Phone Number: 08/31/2024, 3:07 PM  Clinical Narrative:     3:07 PM [facility] admissions informed medical team that patient is not eligible for [facility]. CSW sent patient's FL2 to [facility] and SNFs in [facility]. CSW will continue to follow.  Expected Discharge Plan: Skilled Nursing Facility Barriers to Discharge: Continued Medical Work up, English As A Second Language Teacher, SNF Pending bed offer               Expected Discharge Plan and Services In-house Referral: Clinical Social Work Discharge Planning Services: [employer] Consult Post Acute Care Choice: Skilled Nursing Facility Living arrangements for the past 2 months: Single Family Home                                       Social Drivers of Health (SDOH) Interventions SDOH Screenings   Food Insecurity: No Food Insecurity (08/28/2024)  Housing: High Risk (08/28/2024)  Transportation Needs: No Transportation Needs (08/28/2024)  Utilities: At Risk (08/28/2024)  Alcohol Screen: Low Risk  (09/22/2023)  Depression (PHQ2-9): High Risk (09/22/2023)  Financial Resource Strain: Medium Risk (09/22/2023)  Physical Activity: Inactive (09/22/2023)  Social Connections: Moderately Integrated (09/22/2023)  Stress: No Stress Concern Present (09/22/2023)  Tobacco Use: High Risk (08/28/2024)  Health Literacy: Adequate Health Literacy (09/22/2023)    Readmission Risk Interventions     No data to display

## 2024-08-31 NOTE — [place] FL2 (Signed)
 [place]  MEDICAID FL2 LEVEL OF CARE FORM     IDENTIFICATION  Patient Name: Deanna Goodwin Birthdate: 1960/11/27 Sex: female Admission Date (Current Location): 08/27/2024  [facility] and [place] Number:  Producer, Television/film/video and Address:  [facility] [place], [address] [place], [place] [address]      Provider Number: [id number]  Attending Physician Name and Address:  Donnamarie Lebron PARAS, MD  Relative Name and Phone Number:  Tarita Deshmukh; Son; [phone]    Current Level of Care: Hospital Recommended Level of Care: Skilled Nursing Facility Prior Approval Number:    Date Approved/Denied:   PASRR Number: [id number]  Discharge Plan: SNF    Current Diagnoses: Patient Active Problem List   Diagnosis Date Noted   Hypokalemia 08/28/2024   Hypomagnesemia 08/28/2024   History of essential hypertension 08/28/2024   Tobacco abuse 08/28/2024   Generalized weakness 08/27/2024   Sepsis due to urinary tract infection (HCC) 06/26/2014   Septic shock (HCC) 06/26/2014   Obstructive nephropathy 06/26/2014    Orientation RESPIRATION BLADDER Height & Weight     Self, Time, Situation, Place  Normal (Room Air) Incontinent, External catheter Weight: 188 lb 7.9 oz (85.5 kg) Height:  5' 3 (160 cm)  BEHAVIORAL SYMPTOMS/MOOD NEUROLOGICAL BOWEL NUTRITION STATUS    Convulsions/Seizures (History of seizures) Continent Diet (Please see discharge summary)  AMBULATORY STATUS COMMUNICATION OF NEEDS Skin   Extensive Assist Verbally Normal                       Personal Care Assistance Level of Assistance  Bathing, Dressing, Feeding Bathing Assistance: Maximum assistance Feeding assistance: Maximum assistance Dressing Assistance: Maximum assistance     Functional Limitations Info  Sight Sight Info: Impaired (R and L (Eyeglasses))        SPECIAL CARE FACTORS FREQUENCY  OT (By licensed OT), PT (By licensed PT), Speech therapy     PT Frequency: 5x OT  Frequency: 5x     Speech Therapy Frequency: 3x      Contractures Contractures Info: Not present    Additional Factors Info  Code Status, Allergies Code Status Info: Full Code Allergies Info: NKA           Current Medications (08/31/2024):  This is the current hospital active medication list Current Facility-Administered Medications  Medication Dose Route Frequency Provider Last Rate Last Admin   acetaminophen  (TYLENOL ) tablet 650 mg  650 mg Oral Q6H PRN Howerter, Justin B, DO   650 mg at 08/31/24 0409   Or   acetaminophen  (TYLENOL ) suppository 650 mg  650 mg Rectal Q6H PRN Howerter, Justin B, DO       cyclobenzaprine  (FLEXERIL ) tablet 5 mg  5 mg Oral TID PRN Ezenduka, Nkeiruka J, MD   5 mg at 08/31/24 0409   enoxaparin  (LOVENOX ) injection 40 mg  40 mg Subcutaneous Q24H Ezenduka, Nkeiruka J, MD   40 mg at 08/31/24 1121   feeding supplement (ENSURE PLUS HIGH PROTEIN) liquid 237 mL  237 mL Oral BID BM Ezenduka, Nkeiruka J, MD       folic acid  (FOLVITE ) tablet 1 mg  1 mg Oral Daily Khaliqdina, Salman, MD   1 mg at 08/31/24 1119   gabapentin  (NEURONTIN ) capsule 300 mg  300 mg Oral TID Khaliqdina, Salman, MD   300 mg at 08/31/24 1118   And   gabapentin  (NEURONTIN ) capsule 300 mg  300 mg Oral QHS Khaliqdina, Salman, MD   300 mg at 08/30/24 2019  hydrALAZINE  (APRESOLINE ) injection 10 mg  10 mg Intravenous Q8H PRN Ezenduka, Nkeiruka J, MD       ibuprofen  (ADVIL ) tablet 600 mg  600 mg Oral Q6H PRN Sheikh, Omair Latif, DO   600 mg at 08/31/24 9196   Immune Globulin  10% (PRIVIGEN ) IV infusion 25 g  400 mg/kg (Adjusted) Intravenous Q24 Hr x 5 Khaliqdina, Salman, MD 158 mL/hr at 08/30/24 1602 Rate Change at 08/30/24 1602   lidocaine  (LMX) 4 % cream   Topical BID Khaliqdina, Salman, MD       melatonin tablet 3 mg  3 mg Oral QHS PRN Howerter, Justin B, DO   3 mg at 08/28/24 2118   morphine  (PF) 2 MG/ML injection 2 mg  2 mg Intravenous Once Khaliqdina, Salman, MD       nicotine  (NICODERM CQ  -  dosed in mg/24 hours) patch 14 mg  14 mg Transdermal Daily PRN Howerter, Justin B, DO       ondansetron  (ZOFRAN ) injection 4 mg  4 mg Intravenous Q6H PRN Howerter, Justin B, DO       polyethylene glycol (MIRALAX  / GLYCOLAX ) packet 17 g  17 g Oral BID Sheikh, Omair Mertzon, DO   17 g at 08/30/24 0815   senna-docusate (Senokot-S) tablet 1 tablet  1 tablet Oral BID Sheikh, Omair Latif, DO   1 tablet at 08/31/24 1118   thiamine  (VITAMIN B1) 250 mg in sodium chloride  0.9 % 50 mL IVPB  250 mg Intravenous Daily Khaliqdina, Salman, MD 105 mL/hr at 08/31/24 1124 250 mg at 08/31/24 1124   Followed by   NOREEN ON 09/05/2024] thiamine  (VITAMIN B1) injection 100 mg  100 mg Intravenous Daily Khaliqdina, Salman, MD         Discharge Medications: Please see discharge summary for a list of discharge medications.  Relevant Imaging Results:  Relevant Lab Results:   Additional Information SSN [id number]  Lauraine FORBES Saa, LCSWA

## 2024-08-31 NOTE — Progress Notes (Signed)
 Patient performed NIF and VC with great effort  NIF -40  VC 1.32 L

## 2024-08-31 NOTE — Progress Notes (Signed)
 Occupational Therapy Treatment Patient Details Name: GRETCHEN WEINFELD MRN: [id number] DOB: [birth year] Today's Date: 08/31/2024   History of present illness Pt is a 63 y.o. female presenting 12/1 with weakness. Found to have hypokalemia, hypomagnesia.     PMH: HTN, neuropathy.   OT comments  Patient received in supine and agreeable to OT treatment. Patient required min assist to roll and mod assist to get to EOB with increased time due to pain.  Once on EOB patient requiring BUE support for balance, demonstrating trunk flexion, and using Stedy for support.  Patient attempted to stand x3 from EOB into [facility] and was not able to clear bottom.  Patient with increased fatigue and was assisted back to supine.  Patient able to perform grooming and gown change with min assist.  Patient will benefit from intensive inpatient follow-up therapy, >3 hours/day.  Acute OT to continue to follow to address established goals to facilitate DC to next venue of care.        If plan is discharge home, recommend the following:  Two people to help with walking and/or transfers;A lot of help with bathing/dressing/bathroom;Assistance with cooking/housework;Assist for transportation;Help with stairs or ramp for entrance   Equipment Recommendations  Other (comment) (defer)    Recommendations for Other Services      Precautions / Restrictions Precautions Precautions: Fall Restrictions Weight Bearing Restrictions Per Provider Order: No       Mobility Bed Mobility Overal bed mobility: Needs Assistance Bed Mobility: Rolling, Sidelying to Sit, Sit to Sidelying Rolling: Min assist, Used rails Sidelying to sit: Mod assist, Used rails     Sit to sidelying: Mod assist, Used rails General bed mobility comments: mod assist with BLEs and trunk    Transfers Overall transfer level: Needs assistance Equipment used: Ambulation equipment used Transfers: Sit to/from Stand Sit to Stand: Max assist, +2 safety/equipment            General transfer comment: 3 attempts to stand from EOB into [place] with max assist +2 and patient unable to clear bottom Transfer via Lift Equipment: Stedy   Balance Overall balance assessment: Needs assistance Sitting-balance support: No upper extremity supported, Feet supported Sitting balance-Leahy Scale: Poor Sitting balance - Comments: forward flexed, leaning on Stedy for support   Standing balance support: Bilateral upper extremity supported, Reliant on assistive device for balance Standing balance-Leahy Scale: Zero Standing balance comment: unable to stand from EOB into [place]                           ADL either performed or assessed with clinical judgement   ADL Overall ADL's : Needs assistance/impaired     Grooming: Wash/dry hands;Wash/dry face;Oral care;Minimal assistance;Sitting;Bed level Grooming Details (indicate cue type and reason): sitting up in bed with assistance for toothpaste         Upper Body Dressing : Minimal assistance;Bed level Upper Body Dressing Details (indicate cue type and reason): gown change                   General ADL Comments: patient performed self care tasks at bed level after attempting to transfer to recliner    Extremity/Trunk Assessment              Vision       Perception     Praxis     Communication Communication Communication: Impaired Factors Affecting Communication: Reduced clarity of speech   Cognition Arousal: Alert Behavior During Therapy: [facility]  for tasks assessed/performed Cognition: Cognition impaired       Memory impairment (select all impairments): Short-term memory   Executive functioning impairment (select all impairments): Problem solving                   Following commands: Intact        Cueing   Cueing Techniques: Verbal cues, Gestural cues  Exercises      Shoulder Instructions       General Comments Patient appeared to have increased weakness while  seated on EOB with assistance for trunk and unable to stand    Pertinent Vitals/ Pain       Pain Assessment Pain Assessment: 0-10 Faces Pain Scale: Hurts whole lot Pain Location: Bil feet and hands Pain Descriptors / Indicators: Aching, Pins and needles, Sore Pain Intervention(s): Limited activity within patient's tolerance, Monitored during session, Premedicated before session, Repositioned  Home Living                                          Prior Functioning/Environment              Frequency  Min 2X/week        Progress Toward Goals  OT Goals(current goals can now be found in the care plan section)  Progress towards OT goals: Progressing toward goals  Acute Rehab OT Goals Patient Stated Goal: to get better OT Goal Formulation: With patient Time For Goal Achievement: 09/11/24 Potential to Achieve Goals: Good ADL Goals Pt Will Perform Grooming: with set-up;sitting Pt Will Perform Upper Body Dressing: with set-up;sitting Pt Will Transfer to Toilet: with min assist;stand pivot transfer Pt/caregiver will Perform Home Exercise Program: Increased strength;Both right and left upper extremity;With written HEP provided  Plan      Co-evaluation                 AM-PAC OT 6 Clicks Daily Activity     Outcome Measure   Help from another person eating meals?: A Little Help from another person taking care of personal grooming?: A Little Help from another person toileting, which includes using toliet, bedpan, or urinal?: Total Help from another person bathing (including washing, rinsing, drying)?: A Lot Help from another person to put on and taking off regular upper body clothing?: A Little Help from another person to put on and taking off regular lower body clothing?: Total 6 Click Score: 13    End of Session Equipment Utilized During Treatment: Gait belt;Other (comment) Laurent)  OT Visit Diagnosis: Unsteadiness on feet (R26.81);Muscle  weakness (generalized) (M62.81);Other symptoms and signs involving cognitive function   Activity Tolerance Patient tolerated treatment well   Patient Left in bed;with call bell/phone within reach;with bed alarm set   Nurse Communication Mobility status        Time: 9158-9073 OT Time Calculation (min): 45 min  Charges: OT General Charges $OT Visit: 1 Visit OT Treatments $Self Care/Home Management : 8-22 mins $Therapeutic Activity: 23-37 mins  Dick Laine, OTA Acute Rehabilitation Services  Office [phone]   Jeb LITTIE Laine 08/31/2024, 11:23 AM

## 2024-08-31 NOTE — Progress Notes (Signed)
 PROGRESS NOTE    Deanna Goodwin  FMW:[id number] DOB: 09-Jan-1961 DOA: 08/27/2024 PCP: Tanda Bleacher, MD    Brief Narrative:  63 year old female with a past medical history significant for but not limited to hypertension history of seizures, and history of nephrolithiasis status post ureteroscopy and stent placement with lithotripsy who presents with a 1 week history of ascending weakness involving all 4 extremities with primarily in bilateral upper extremities. Pt was recently seen in the ED on 11/27 with similar symptoms and brain MRI done showed no acute process and no evidence of infarct. Given her continued worsening and generalized weakness she presented again and neurology was consulted and recommended MRI C-spine which was negative.  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  Neurology on board, started patient on IVIG.    Today, patient reports better control of neuropathic pain. Denies any new complaints.    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  Noted bilateral nonobstructing nephrolithiasis Outpatient follow up  Essential Hypertension Initially BP soft, currently uncontrolled possibly due to generalized pain versus IVIG  Hydralazine  IV as needed  Chronic Tobacco Abuse Smokes a pack per day for nearly 40 years Advised to quit  C/w Nicotine  14 mg TD Patch and Nicotine  Prolacrilex 2 mg po as needed   Hypoalbuminemia Follow-up   Class I Obesity Lifestyle modification advised    DVT prophylaxis: enoxaparin  (LOVENOX ) injection 40 mg Start: 08/29/24 1045 SCDs Start: 08/28/24 0008    Code Status: Full Code Family Communication: None at bedside  Disposition Plan:  Level of care: Progressive Status is: Inpatient Remains inpatient appropriate because: Level of care  Consultants:  Neurology  Procedures:  LP on 12/2  Antimicrobials:  Anti-infectives (From admission, onward)    None        Objective: Vitals:   08/30/24 2300 08/31/24 0300 08/31/24 0755 08/31/24 1143  BP: (!) 157/87 (!) 158/82 (!) 155/86 (!) 150/73  Pulse: 88 85 98 99  Resp: 14 14 17 14   Temp: 98.4 F (36.9 C) 98.3 F (36.8 C) 98.5 F (36.9 C) 97.8 F (36.6 C)  TempSrc: Oral Oral Oral Oral  SpO2: 100% 100% 98% 100%  Weight:  85.5 kg    Height:        Intake/Output Summary (Last 24 hours) at 08/31/2024 1315 Last data filed at 08/31/2024 0756 Gross per 24 hour  Intake 240 ml  Output 1950 ml  Net -1710 ml   Filed Weights   08/29/24 0208 08/30/24 0458 08/31/24 0300  Weight: 86.8 kg 86 kg  85.5 kg   Examination: Physical Exam: General: NAD  Cardiovascular: S1, S2 present Respiratory: Diminished breath sounds bilaterally, no accessory muscle use Abdomen: Soft, nontender, nondistended, bowel sounds  present Musculoskeletal: No bilateral pedal edema noted Skin: Normal Psychiatry: Normal mood  Neurology: Noted extremity weakness/numbness, otherwise no obvious neurologic deficits noted    Data Reviewed: I have personally reviewed following labs and imaging studies  CBC: Recent Labs  Lab 08/27/24 1854 08/28/24 0117 08/29/24 0523 08/30/24 0327  WBC 7.0 7.1 4.2 5.4  NEUTROABS  --  5.3 2.8 3.5  HGB 15.2* 13.4 12.1 12.4  HCT 47.4* 40.0 35.5* 35.8*  MCV 107.7* 102.0* 98.9 99.2  PLT 215 196 187 186   Basic Metabolic Panel: Recent Labs  Lab 08/27/24 1854 08/27/24 2100 08/28/24 0117 08/29/24 0523 08/30/24 0327 08/31/24 0234  NA 134*  --  136 136 136 138  K 3.3*  --  3.4* 4.4 4.2 4.2  CL 96*  --  93* 96* 98 99  CO2 23  --  25 29 27  33*  GLUCOSE 91  --  106* 104* 96 102*  BUN 5*  --  5* <5* 5* 5*  CREATININE 0.59  --  0.59 0.50 0.54 0.56  CALCIUM  8.5*  --  8.4* 8.4* 8.5* 8.4*  MG  --  1.5* 2.1 1.6* 1.7 1.6*  PHOS  --   --   --  2.9  --   --    GFR: Estimated Creatinine Clearance: 76.5 mL/min (by C-G formula based on SCr of 0.56 mg/dL). Liver Function Tests: Recent Labs  Lab 08/27/24 1854 08/28/24 0117 08/29/24 0523 08/30/24 0327 08/31/24 0234  AST 81* 68* 55* 55* 65*  ALT 23 20 17 18 17   ALKPHOS 95 88 71 70 82  BILITOT 0.9 0.9 0.8 0.7 0.5  PROT 6.8 5.8* 5.8* 6.5 6.9  ALBUMIN 2.8* 2.5* 2.2* 2.2* 2.0*   No results for input(s): LIPASE, AMYLASE in the last 168 hours.  No results for input(s): AMMONIA in the last 168 hours. Coagulation Profile: No results for input(s): INR, PROTIME in the last 168 hours. Cardiac Enzymes: No results for input(s): CKTOTAL, CKMB, CKMBINDEX, TROPONINI in the last 168 hours. BNP (last 3 results) No results for input(s): PROBNP in the last 8760 hours. HbA1C: No results for input(s): HGBA1C in the last 72 hours. CBG: No results for input(s): GLUCAP in the last 168 hours. Lipid Profile: No results for  input(s): CHOL, HDL, LDLCALC, TRIG, CHOLHDL, LDLDIRECT in the last 72 hours. Thyroid  Function Tests: No results for input(s): TSH, T4TOTAL, FREET4, T3FREE, THYROIDAB in the last 72 hours. Anemia Panel: No results for input(s): VITAMINB12, FOLATE, FERRITIN, TIBC, IRON, RETICCTPCT in the last 72 hours. Sepsis Labs: No results for input(s): PROCALCITON, LATICACIDVEN in the last 168 hours.  Recent Results (from the past 240 hours)  MRSA Next Gen by PCR, Nasal     Status: None   Collection Time: 08/28/24  3:08 AM   Specimen: Nasal Mucosa; Nasal Swab  Result Value Ref Range Status   MRSA by PCR Next Gen NOT DETECTED NOT DETECTED Final    Comment: (NOTE) The GeneXpert MRSA Assay (FDA approved for NASAL specimens only), is one component of a comprehensive MRSA colonization surveillance program. It is not intended to diagnose MRSA infection nor to guide or monitor treatment for MRSA infections. Test performance is not FDA approved in patients less than 62 years old. Performed at [facility], [address] [place], [place] [address]  CSF culture w Gram Stain     Status: None   Collection Time: 08/28/24 10:44 AM   Specimen: PATH Cytology CSF; Cerebrospinal Fluid  Result Value Ref Range Status   Specimen Description CSF  Final   Special Requests NONE  Final   Gram Stain NO WBC SEEN NO ORGANISMS SEEN CYTOSPIN SMEAR   Final   Culture   Final    NO GROWTH 3 DAYS Performed at [facility], [address] [place], [place] [address]    Report Status 08/31/2024 FINAL  Final    Radiology Studies: DG Swallowing Func-Speech Pathology Result Date: 08/31/2024 Table formatting from the original result was not included. Modified Barium Swallow Study Patient Details Name: TAMARRA GEISELMAN MRN: [id number] Date of Birth: [birth year] Today's Date: 08/31/2024 HPI/PMH: HPI: Ms. Muraoka is a 63 y.o. female presenting from home on 08/27/24 with generalized  aches and weakness. 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 PmHx significant for HTN, neuropathy, and seizures. SLP consulted for clinical swallow evaluation due to increasing oral weakness and concerns for reduced oral transit of solids. Observed large oral mass vs edema on hard palate. Pt aware of this oral abnormality and reported it has increased in size over the past two years. 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 Clinical Impression: Clinical Impression: Pt presents with a mild oral dysphagia. She is protecting her airway - there was no aspiration. There is some decreased mobility of the larynx, leading to occasional transient penetration of thin liquids (PAS 2- considered WFL). There is slowed manipulation and propulsion of solid foods - potentially related to neuromuscular impact of GB or a compensation for the large bony outgrowths on the hard palate (tori palatini).  Pt is safe to continue a PO diet -recommend continuing dysphagia 2/thin liquds for now.  SLP will follow for diet advancement/education as needed. Pt would benefit from f/u with her dentist to address the palatal issues s/p D/C. Factors that may increase risk of adverse event in presence of aspiration Noe & Lianne 2021): No data recorded Recommendations/Plan: Swallowing Evaluation Recommendations Swallowing Evaluation Recommendations Recommendations: PO diet PO Diet Recommendation: Dysphagia 2 (Finely chopped); Thin liquids (Level 0) Liquid Administration via: Cup; Straw Medication Administration: Whole meds with liquid Supervision: Staff to assist with self-feeding Swallowing strategies  : Small bites/sips Postural changes: Position pt fully upright for meals Oral care recommendations: Oral care BID  (2x/day) Treatment Plan Treatment Plan Treatment recommendations: Therapy as outlined in treatment plan below Follow-up recommendations: No SLP follow up Treatment frequency: Min 2x/week Treatment duration: 1 week Interventions: Diet toleration management by SLP; Trials of upgraded texture/liquids; Patient/family education Recommendations Recommendations for follow up therapy are one component of a multi-disciplinary discharge planning process, led by the attending physician.  Recommendations may be updated based on patient status, additional functional criteria and insurance authorization. Assessment: Orofacial Exam: Orofacial Exam Oral Cavity: Oral Hygiene: WFL Oral Cavity - Dentition: Adequate natural dentition Orofacial Anatomy: Other (comment) (see HPI) Oral Motor/Sensory Function: WFL Anatomy: Anatomy: Other (Comment); Suspected cervical osteophytes (tori palatini; osteophytes not dx'd on CT cervical spine) Boluses Administered: Boluses Administered Boluses Administered: Thin liquids (Level 0); Mildly thick liquids (Level 2, nectar thick); Puree; Solid  Oral Impairment Domain: Oral Impairment Domain Lip Closure: No labial escape Tongue control during bolus hold: Cohesive  bolus between tongue to palatal seal Bolus preparation/mastication: Slow prolonged chewing/mashing with complete recollection Bolus transport/lingual motion: Slow tongue motion Oral residue: Trace residue lining oral structures Location of oral residue : Palate; Tongue Initiation of pharyngeal swallow : Pyriform sinuses  Pharyngeal Impairment Domain: Pharyngeal Impairment Domain Soft palate elevation: No bolus between soft palate (SP)/pharyngeal wall (PW) Laryngeal elevation: Partial superior movement of thyroid  cartilage/partial approximation of arytenoids to epiglottic petiole Anterior hyoid excursion: Complete anterior movement Epiglottic movement: Complete inversion Laryngeal vestibule closure: Complete, no air/contrast in laryngeal  vestibule Pharyngeal stripping wave : Present - complete Pharyngeal contraction (A/P view only): N/A Pharyngoesophageal segment opening: Complete distension and complete duration, no obstruction of flow Tongue base retraction: No contrast between tongue base and posterior pharyngeal wall (PPW) Pharyngeal residue: Complete pharyngeal clearance Location of pharyngeal residue: N/A  Esophageal Impairment Domain: Esophageal Impairment Domain Esophageal clearance upright position: Esophageal retention Pill: Pill Consistency administered: Thin liquids (Level 0) Thin liquids (Level 0): [facility] Penetration/Aspiration Scale Score: Penetration/Aspiration Scale Score 2.  Material enters airway, remains ABOVE vocal cords then ejected out: Mildly thick liquids (Level 2, nectar thick); Puree; Solid; Pill 3.  Material enters airway, remains ABOVE vocal cords and not ejected out: Thin liquids (Level 0) Compensatory Strategies: Compensatory Strategies Compensatory strategies: Yes Straw: Effective (n)   General Information: Caregiver present: No  Diet Prior to this Study: Dysphagia 2 (finely chopped); Thin liquids (Level 0)   Temperature : Normal   Respiratory Status: WFL   Supplemental O2: None (Room air)   History of Recent Intubation: No  Behavior/Cognition: Alert; Cooperative Self-Feeding Abilities: Able to self-feed; Needs assist with self-feeding Baseline vocal quality/speech: Hypophonia/low volume No data recorded Volitional Swallow: Able to elicit Exam Limitations: No limitations Goal Planning: Prognosis for improved oropharyngeal function: Good No data recorded No data recorded No data recorded No data recorded Pain: Pain Assessment Pain Assessment: 0-10 Pain Score: 10 Pain Location: bil feet - brief stabs of pain Pain Intervention(s): Limited activity within patient's tolerance End of Session: Start Time:SLP Start Time (ACUTE ONLY): 1330 Stop Time: SLP Stop Time (ACUTE ONLY): 1357 Time Calculation:SLP Time Calculation (min)  (ACUTE ONLY): 27 min Charges: SLP Evaluations $ SLP Speech Visit: 1 Visit SLP Evaluations $MBS Swallow: 1 Procedure SLP visit diagnosis: SLP Visit Diagnosis: Dysphagia, oral phase (R13.11) Past Medical History: Past Medical History: Diagnosis Date  Hypertension   Kidney stones   Seizures (HCC)  Past Surgical History: Past Surgical History: Procedure Laterality Date  CYSTOSCOPY WITH RETROGRADE PYELOGRAM, URETEROSCOPY AND STENT PLACEMENT Right 06/26/2014  Procedure: CYSTOSCOPY WITH STENT PLACEMENT;  Surgeon: Gretel Ferrara, MD;  Location: [facility];  Service: Urology;  Laterality: Right;  KIDNEY STONE SURGERY Left 2013/2015  LITHOTRIPSY   [name] [name] 08/31/2024, 11:04 AM  CT MAXILLOFACIAL W CONTRAST Result Date: 08/30/2024 EXAM: CT Face with contrast 08/29/2024 10:17:01 PM TECHNIQUE: CT of the face was performed with the administration of 75 mL of iohexol  (OMNIPAQUE ) 350 MG/ML injection. Multiplanar reformatted images are provided for review. Automated exposure control, iterative reconstruction, and/or weight based adjustment of the mA/kV was utilized to reduce the radiation dose to as low as reasonably achievable. 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 ??large tonsils. FINDINGS: AERODIGESTIVE TRACT: No tonsillar enlargement. No mass. No edema. SALIVARY GLANDS: No acute abnormality. LYMPH NODES: No suspicious cervical lymphadenopathy. SOFT TISSUES: No mass or fluid collection. BRAIN, ORBITS AND SINUSES: No acute abnormality. BONES: Large tori palatini. No acute abnormality. No suspicious bone lesion. IMPRESSION:  1. Large tori palatini, likely accounting for the visualized oral cavity mass. 2. No tonsillar enlargement. Electronically signed by: Franky Stanford MD 08/30/2024 01:00 AM EST RP Workstation: [id number]   CT ABDOMEN PELVIS W CONTRAST Result Date: 08/30/2024 EXAM: CT ABDOMEN AND PELVIS WITH CONTRAST 08/29/2024 10:17:01 PM TECHNIQUE: CT of the abdomen  and pelvis was performed with the administration of 75 mL of iohexol  (OMNIPAQUE ) 350 MG/ML injection. Multiplanar reformatted images are provided for review. Automated exposure control, iterative reconstruction, and/or weight-based adjustment of the mA/kV was utilized to reduce the radiation dose to as low as reasonably achievable. COMPARISON: 08/18/2024 CLINICAL HISTORY: Recent acute pancreatitis. Repeat CT to rule out focal pancreatic lesion. FINDINGS: LOWER CHEST: Left lower lobe atelectasis and trace left pleural effusion. LIVER: Focal fat deposition along the falciform ligament. GALLBLADDER AND BILE DUCTS: Gallbladder is unremarkable. No biliary ductal dilatation. SPLEEN: No acute abnormality. PANCREAS: Decreased peripancreatic edema and stranding compared to 08/18/2024, compatible with improving pancreatitis. Complex fluid collection about the tail of the pancreas extending to the splenic hilum is slightly decreased in size now measuring 5.1 x 3.5 cm in the axial plane, previously 6.7 x 3.7 cm using similar technique. No pancreatic necrosis. No pancreatic ductal dilation. No CT evidence of pancreatic mass. Improving interstitial pancreatitis. ADRENAL GLANDS: No acute abnormality. KIDNEYS, URETERS AND BLADDER: Large calculus in the upper pole of the right kidney. Punctate nonobstructing calculi in the lower pole of the left kidney. No hydronephrosis. No perinephric or periureteral stranding. Urinary bladder is unremarkable. GI AND BOWEL: Stomach demonstrates no acute abnormality. Wall thickening, mucosal hyperenhancement, and pericolonic fat stranding about the ascending colon and cecum. There is no bowel obstruction. No pneumatosis. PERITONEUM AND RETROPERITONEUM: No free air. VASCULATURE: Aorta is normal in caliber. LYMPH NODES: No lymphadenopathy. REPRODUCTIVE ORGANS: Calcified uterine fibroid. BONES AND SOFT TISSUES: No acute osseous abnormality. No focal soft tissue abnormality. IMPRESSION: 1. Nonspecific  colitis of the cecum and ascending colon, likely infectious/inflammatory. Ischemic colitis is considered less likely but not excluded. 2. Improving interstitial pancreatitis. No pancreatic necrosis or ductal dilation. No CT evidence of pancreatic mass. 3. Bilateral nonobstructing nephrolithiasis. Electronically signed by: Norman Gatlin MD 08/30/2024 12:27 AM EST RP Workstation: [id number]   Scheduled Meds:  enoxaparin  (LOVENOX ) injection  40 mg Subcutaneous Q24H   feeding supplement  237 mL Oral BID BM   folic acid   1 mg Oral Daily   gabapentin   300 mg Oral TID   And   gabapentin   300 mg Oral QHS   lidocaine    Topical BID    morphine  injection  2 mg Intravenous Once   polyethylene glycol  17 g Oral BID   senna-docusate  1 tablet Oral BID   [START ON 09/05/2024] thiamine  (VITAMIN B1) injection  100 mg Intravenous Daily   Continuous Infusions:  Immune Globulin  10% 158 mL/hr at 08/30/24 1602   thiamine  (VITAMIN B1) injection 250 mg (08/31/24 1124)    LOS: 3 days   Lebron JINNY Cage, MD [facility] Available via Epic secure chat 7am-7pm After these hours, please refer to coverage provider listed on [email or web address] 08/31/2024, 1:15 PM

## 2024-08-31 NOTE — Progress Notes (Signed)
 Speech Language Pathology Treatment: Dysphagia  Patient Details Name: Deanna Goodwin MRN: [id number] DOB: 04/20/1961 Today's Date: 08/31/2024 Time: 8578-8567 SLP Time Calculation (min) (ACUTE ONLY): 11 min  Assessment / Plan / Recommendation Clinical Impression  F/u to review results of yesterday's MBS with Deanna Goodwin. She verbalized understanding - prefers to remain on dysphagia 2 diet for now given greater ease in chewing this consistency.  Tolerating solids well and there are no concerns for aspiration of thin liquids.  SLP to follow briefly - speech evaluation pending.   HPI HPI: Deanna Goodwin is a 63 y.o. female presenting from home on 08/27/24 with generalized aches and weakness. 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 PmHx significant for HTN, neuropathy, and seizures. SLP consulted for clinical swallow evaluation due to increasing oral weakness and concerns for reduced oral transit of solids. Observed large oral mass vs edema on hard palate. Pt aware of this oral abnormality and reported it has increased in size over the past two years. 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 MBS 12/4: PAS 2 penetration thins, no aspiration; mild oral dysphagia.      SLP Plan  Continue with current plan of care        Swallow Evaluation Recommendations   Recommendations: PO diet PO Diet Recommendation: Dysphagia 2 (Finely chopped);Thin liquids (Level 0) Liquid Administration via: Cup;Straw Medication Administration: Whole meds with liquid Supervision: Patient able to self-feed;Staff to assist with self-feeding Postural changes: Position pt fully upright for meals;Stay upright 30-60 min after meals Oral care recommendations: Oral care BID (2x/day)     Recommendations                      Oral care BID     Dysphagia, oral phase (R13.11)     Continue with current plan of care    Deanna Barbe L. [name], MA CCC/SLP Clinical Specialist - Acute Care SLP Acute Rehabilitation Services Office number [phone]  Deanna Goodwin  08/31/2024, 2:36 PM

## 2024-08-31 NOTE — Progress Notes (Signed)
 NIF- 40 VC 1.2 w/ good effort

## 2024-08-31 NOTE — Progress Notes (Signed)
 NIF = -40 FVC = 1.10L  Great effort.  Will continue to monitor.

## 2024-08-31 NOTE — Progress Notes (Signed)
 Physical Therapy Treatment Patient Details Name: Deanna Goodwin MRN: [id number] DOB: [birth year] Today's Date: 08/31/2024   History of Present Illness Pt is a 63 y.o. female presenting 12/1 with weakness. Found to have hypokalemia, hypomagnesia.     PMH: HTN, neuropathy.    PT Comments  Showing some functional progress, demonstrated ability to stand from elevated surface using Stedy with max assist. Followed by multiple stands from perched position on Stedy paddles - still with max assist for boost and trunk control. Worked on midline control and weight shifting. Standing intervals limited < 15 seconds each time. Great effort put forth. Patient will continue to benefit from skilled physical therapy services to further improve independence with functional mobility. Patient will benefit from continued inpatient follow up therapy, <3 hours/day.     If plan is discharge home, recommend the following: Two people to help with walking and/or transfers;A lot of help with bathing/dressing/bathroom;Assistance with cooking/housework;Assist for transportation;Help with stairs or ramp for entrance   Can travel by private vehicle     No  Equipment Recommendations  Wheelchair (measurements PT);Wheelchair cushion (measurements PT);Hoyer lift;Hospital bed    Recommendations for Other Services       Precautions / Restrictions Precautions Precautions: Fall Restrictions Weight Bearing Restrictions Per Provider Order: No     Mobility  Bed Mobility Overal bed mobility: Needs Assistance Bed Mobility: Rolling, Sidelying to Sit, Sit to Sidelying Rolling: Min assist, Used rails Sidelying to sit: Mod assist, Used rails     Sit to sidelying: Mod assist, Used rails General bed mobility comments: Min assist to roll, cues for rail use, support for LEs, mod assist for trunk support to rise and lower back onto side with LE support to lift into bed.    Transfers Overall transfer level: Needs  assistance Equipment used: Ambulation equipment used Transfers: Sit to/from Stand Sit to Stand: Max assist, From elevated surface           General transfer comment: Max assist for boost to stand from elevated bed setting using Stedy. Adequate grip to pull but weak. Reduced trunk and LE control. Practiced multiple times standing and weight bearing from Stedy paddles in perched position. Up to max assist for each stand but with some improved mechanics follow cues. Transfer via Lift Equipment: Stedy  Ambulation/Gait               General Gait Details: Deferred for safety at this time.   Stairs             Wheelchair Mobility     Tilt Bed    Modified Rankin (Stroke Patients Only)       Balance Overall balance assessment: Needs assistance Sitting-balance support: Single extremity supported, Feet supported Sitting balance-Leahy Scale: Poor Sitting balance - Comments: Requires at least 1 UE for support.   Standing balance support: Bilateral upper extremity supported, Reliant on assistive device for balance Standing balance-Leahy Scale: Zero Standing balance comment: Stands with stedy and max assist.                            Communication Communication Communication: Impaired Factors Affecting Communication: Reduced clarity of speech  Cognition Arousal: Alert Behavior During Therapy: WFL for tasks assessed/performed   PT - Cognitive impairments: Memory, Awareness, Problem solving                         Following commands: Intact  Cueing Cueing Techniques: Verbal cues, Gestural cues  Exercises General Exercises - Lower Extremity Ankle Circles/Pumps: AROM, Both, 10 reps, Supine Quad Sets: Strengthening, Both, 10 reps, Supine Other Exercises Other Exercises: NMRE with trunk control, weight shift, and midline control, practiced from seated position on bed, perched position on Stedy, and in standing for brief intervals.     General Comments General comments (skin integrity, edema, etc.): VSS throughout      Pertinent Vitals/Pain Pain Assessment Pain Assessment: Faces Faces Pain Scale: Hurts even more Pain Location: Bil feet and hands Pain Descriptors / Indicators: Pins and needles, Sore, Burning Pain Intervention(s): Limited activity within patient's tolerance, Monitored during session, Repositioned    Home Living                          Prior Function            PT Goals (current goals can now be found in the care plan section) Acute Rehab PT Goals Patient Stated Goal: Get well PT Goal Formulation: With patient Time For Goal Achievement: 09/11/24 Potential to Achieve Goals: Good Progress towards PT goals: Progressing toward goals    Frequency    Min 3X/week      PT Plan      Co-evaluation              AM-PAC PT 6 Clicks Mobility   Outcome Measure  Help needed turning from your back to your side while in a flat bed without using bedrails?: A Little Help needed moving from lying on your back to sitting on the side of a flat bed without using bedrails?: A Lot Help needed moving to and from a bed to a chair (including a wheelchair)?: Total Help needed standing up from a chair using your arms (e.g., wheelchair or bedside chair)?: A Lot Help needed to walk in hospital room?: Total Help needed climbing 3-5 steps with a railing? : Total 6 Click Score: 10    End of Session Equipment Utilized During Treatment: Gait belt;Oxygen Activity Tolerance: Other (comment);Patient tolerated treatment well (Limited by Weakness) Patient left: in bed;with call bell/phone within reach;with bed alarm set;with family/visitor present Nurse Communication: Mobility status;Need for lift equipment PT Visit Diagnosis: Unsteadiness on feet (R26.81);Other abnormalities of gait and mobility (R26.89);Muscle weakness (generalized) (M62.81);History of falling (Z91.81);Difficulty in walking, not  elsewhere classified (R26.2);Other symptoms and signs involving the nervous system (R29.898)     Time: 8678-8643 PT Time Calculation (min) (ACUTE ONLY): 35 min  Charges:    $Therapeutic Activity: 8-22 mins $Neuromuscular Re-education: 8-22 mins PT General Charges $$ ACUTE PT VISIT: 1 Visit                     Deanna Goodwin, PT, DPT [facility]  Rehabilitation Services Physical Therapist Office: [phone] Website: [email or web address]    Deanna Goodwin 08/31/2024, 5:29 PM

## 2024-08-31 NOTE — Progress Notes (Signed)
  Inpatient Rehabilitation Admissions Coordinator   Met with patient and her Mom at bedside for rehab assessment. We discussed goals and expectations of a possible CIR admit. Patient lives at home with her spouse and 2 adult sons, but they all work. No other caregivers available. Patient would not be expected to reach independent level to return home alone after a CIR admit. I recommend other rehab venues and she is in agreement. I will alert acute team and TOC. We will sign off. Please call me with any questions.   Heron Leavell, RN, MSN Rehab Admissions Coordinator [phone] ,

## 2024-08-31 NOTE — Progress Notes (Signed)
 SPEECH PATHOLOGY ]MODIFIED BARIUM SWALLOW STUDY LATE ENTRY FROM 08/30/24  08/30/24 1400  SLP Visit Information  SLP Received On 08/29/24  Pain Assessment  Pain Assessment 0-10  Pain Score 10  Pain Location bil feet - brief stabs of pain  Pain Intervention(s) Limited activity within patient's tolerance  General Information  HPI Ms. Osgood is a 63 y.o. female presenting from home on 08/27/24 with generalized aches and weakness. 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 PmHx significant for HTN, neuropathy, and seizures. SLP consulted for clinical swallow evaluation due to increasing oral weakness and concerns for reduced oral transit of solids. Observed large oral mass vs edema on hard palate. Pt aware of this oral abnormality and reported it has increased in size over the past two years. 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; osteophytes not dx'd on CT cervical spine)  Orofacial Exam  Oral Cavity: Oral Hygiene WFL  Oral Cavity - Dentition Adequate natural dentition  Orofacial Anatomy Other (comment) (see HPI)  Oral Motor/Sensory Function WFL  Boluses Administered  Boluses Administered Thin liquids (Level 0);Mildly thick liquids (Level 2, nectar thick);Puree;Solid  Oral Impairment Domain   Lip Closure No labial escape  Tongue control during bolus hold Cohesive bolus between tongue to palatal seal  Bolus preparation/mastication Slow prolonged chewing/mashing with complete recollection  Bolus transport/lingual motion Slow tongue motion  Oral residue Trace residue lining oral structures  Location of oral residue  Palate;Tongue  Initiation of pharyngeal swallow  Pyriform sinuses  Pharyngeal Impairment Domain  Soft palate elevation No bolus between soft palate (SP)/pharyngeal wall (PW)  Laryngeal elevation Partial superior movement of thyroid  cartilage/partial approximation of arytenoids to epiglottic petiole  Anterior hyoid excursion Complete anterior movement  Epiglottic movement Complete inversion  Laryngeal vestibule closure Complete, no air/contrast in laryngeal vestibule  Pharyngeal stripping wave  Present - complete  Pharyngeal contraction (A/P view only) N/A  Pharyngoesophageal segment opening Complete distension and complete duration, no obstruction of flow  Tongue base retraction No contrast between tongue base and posterior pharyngeal wall (PPW)  Pharyngeal residue Complete pharyngeal clearance  Location of pharyngeal residue N/A  Esophageal Impairment Domain  Esophageal clearance upright position Esophageal retention  Pill  Consistency administered Thin liquids (Level 0)  Thin liquids (Level 0) WFL  Penetration/Aspiration Scale Score  2.  Material enters airway, remains ABOVE vocal cords then ejected out Mildly thick liquids (Level 2, nectar thick);Puree;Solid;Pill  3.  Material enters airway, remains ABOVE vocal cords and not ejected out Thin liquids (Level 0)  Compensatory Strategies  Compensatory strategies Yes  Straw Effective (n)  Clinical Impression  Clinical Impression Pt presents with a mild oral dysphagia. She is protecting her airway - there was no aspiration. There is some decreased mobility of the larynx, leading to occasional transient penetration  of thin liquids (PAS 2- considered WFL). 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  Pt is safe to continue a PO diet -recommend continuing dysphagia 2/thin liquds for now.  SLP will follow for diet advancement/education as needed. Pt would benefit from f/u with her dentist to address the palatal issues s/p D/C.  SLP Visit Diagnosis Dysphagia, oral phase (R13.11)  Exam Limitations No limitations  Swallowing Evaluation Recommendations  Recommendations PO diet  PO Diet Recommendation Dysphagia 2 (Finely chopped);Thin liquids (Level 0)  Liquid Administration via Cup;Straw  Medication Administration Whole meds with liquid  Supervision Staff to assist with self-feeding  Swallowing strategies   Small bites/sips  Postural changes Position pt fully upright for meals  Oral care recommendations Oral care BID (2x/day)  Treatment Plan  Treatment recommendations Therapy as outlined in treatment plan below  Follow-up recommendations No SLP follow up  Treatment frequency Min 2x/week  Treatment duration 1 week  Interventions Diet toleration management by SLP;Trials of upgraded texture/liquids;Patient/family education  Goal Planning  Prognosis for improved oropharyngeal function Good  SLP Time Calculation  SLP Start Time (ACUTE ONLY) 1330  SLP Stop Time (ACUTE ONLY) 1357  SLP Time Calculation (min) (ACUTE ONLY) 27 min  SLP Evaluations  $ SLP Speech Visit 1 Visit  SLP Evaluations  $MBS Swallow 1 Procedure  Neysa Arts L. [name], MA CCC/SLP Clinical Specialist - Acute Care SLP Acute Rehabilitation Services Office number [phone]

## 2024-09-01 DIAGNOSIS — G610 Guillain-Barre syndrome: Secondary | ICD-10-CM

## 2024-09-01 DIAGNOSIS — R531 Weakness: Secondary | ICD-10-CM | POA: Diagnosis not present

## 2024-09-01 LAB — VITAMIN B1: Vitamin B1 (Thiamine): 42 nmol/L — ABNORMAL LOW (ref 66.5–200.0)

## 2024-09-01 LAB — MAGNESIUM: Magnesium: 1.9 mg/dL (ref 1.7–2.4)

## 2024-09-01 MED ORDER — GABAPENTIN 400 MG PO CAPS
400.0000 mg | ORAL_CAPSULE | Freq: Three times a day (TID) | ORAL | Status: DC
  Administered 2024-09-01 – 2024-09-04 (×9): 400 mg via ORAL
  Filled 2024-09-01 (×9): qty 1

## 2024-09-01 MED ORDER — GABAPENTIN 400 MG PO CAPS
400.0000 mg | ORAL_CAPSULE | Freq: Every day | ORAL | Status: DC
  Administered 2024-09-01 – 2024-09-04 (×4): 400 mg via ORAL
  Filled 2024-09-01 (×4): qty 1

## 2024-09-01 MED ORDER — MAGNESIUM SULFATE 2 GM/50ML IV SOLN
2.0000 g | Freq: Once | INTRAVENOUS | Status: AC
  Administered 2024-09-01: 2 g via INTRAVENOUS
  Filled 2024-09-01: qty 50

## 2024-09-01 NOTE — Progress Notes (Signed)
 VC Test As Follows  FVC- 1.43    %Pred 57  FEV1-  1.04  %Pred 53  FEV1/FVC-  0.73  FEF-  0.76   NIF- 1st Attempt -21 2nd Attempt -10 3rd Attempt -8

## 2024-09-01 NOTE — TOC Progression Note (Signed)
 Transition of Care [facility]) - Progression Note    Patient Details  Name: Deanna Goodwin MRN: [id number] Date of Birth: December 05, 1960  [facility] CM/SW Contact  Robynn Eileen Hoose, RN Phone Number: 09/01/2024, 11:18 AM  Clinical Narrative:   Message received from [facility] rehab liaison, bed available for patient if she was still interested.  Spoke with patient, she prefers somewhere closer. Message to CSW to see if any SNF facilities have accepted.    Expected Discharge Plan: Skilled Nursing Facility Barriers to Discharge: Continued Medical Work up, English As A Second Language Teacher, SNF Pending bed offer               Expected Discharge Plan and Services In-house Referral: Clinical Social Work Discharge Planning Services: [employer] Consult Post Acute Care Choice: Skilled Nursing Facility Living arrangements for the past 2 months: Single Family Home                                       Social Drivers of Health (SDOH) Interventions SDOH Screenings   Food Insecurity: No Food Insecurity (08/28/2024)  Housing: High Risk (08/28/2024)  Transportation Needs: No Transportation Needs (08/28/2024)  Utilities: At Risk (08/28/2024)  Alcohol Screen: Low Risk  (09/22/2023)  Depression (PHQ2-9): High Risk (09/22/2023)  Financial Resource Strain: Medium Risk (09/22/2023)  Physical Activity: Inactive (09/22/2023)  Social Connections: Moderately Integrated (09/22/2023)  Stress: No Stress Concern Present (09/22/2023)  Tobacco Use: High Risk (08/28/2024)  Health Literacy: Adequate Health Literacy (09/22/2023)    Readmission Risk Interventions     No data to display

## 2024-09-01 NOTE — Progress Notes (Signed)
 PROGRESS NOTE    Deanna Goodwin  FMW:[id number] DOB: 1961-06-30 DOA: 08/27/2024 PCP: Tanda Bleacher, MD    Brief Narrative:  63 year old female with a past medical history significant for but not limited to hypertension history of seizures, and history of nephrolithiasis status post ureteroscopy and stent placement with lithotripsy who presents with a 1 week history of ascending weakness involving all 4 extremities with primarily in bilateral upper extremities. Pt was recently seen in the ED on 11/27 with similar symptoms and brain MRI done showed no acute process and no evidence of infarct. Given her continued worsening and generalized weakness she presented again and neurology was consulted and recommended MRI C-spine which was negative.  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  Neurology on board, started patient on IVIG.    Today, still reporting some neuropathic pain.     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  Noted bilateral nonobstructing nephrolithiasis Outpatient follow up  Essential Hypertension Initially BP soft, currently stable Hydralazine  IV as needed  Chronic Tobacco Abuse Smokes a pack per day for nearly 40 years Advised to quit  C/w Nicotine  14 mg TD Patch and Nicotine  Prolacrilex 2 mg po as needed   Hypoalbuminemia Follow-up   Class I Obesity Lifestyle modification advised    DVT prophylaxis: enoxaparin  (LOVENOX ) injection 40 mg Start: 08/29/24 1045 SCDs Start: 08/28/24 0008    Code Status: Full Code Family Communication: None at bedside  Disposition Plan:  Level of care: Progressive Status is: Inpatient Remains inpatient appropriate because: Level of care  Consultants:  Neurology  Procedures:  LP on 12/2  Antimicrobials:  Anti-infectives (From admission, onward)    None        Objective: Vitals:   09/01/24 0607 09/01/24 0808 09/01/24 0847 09/01/24 1145  BP:  (!) 140/78 (!) 140/78 137/80  Pulse:  (!) 108 (!) 111 94  Resp:  16 12 19   Temp:  98.9 F (37.2 C)  98.3 F (36.8 C)  TempSrc:  Oral  Oral  SpO2:  99% 100% 100%  Weight: 86.3 kg     Height:        Intake/Output Summary (Last 24 hours) at 09/01/2024 1311 Last data filed at 09/01/2024 1146 Gross per 24 hour  Intake 1505 ml  Output 1900 ml  Net -395 ml   Filed Weights   08/30/24 0458 08/31/24 0300 09/01/24 0607  Weight: 86 kg 85.5 kg 86.3 kg   Examination: Physical Exam: General: NAD  Cardiovascular: S1, S2 present Respiratory: Diminished breath sounds bilaterally, no accessory muscle use Abdomen:  Soft, nontender, nondistended, bowel sounds present Musculoskeletal: No bilateral pedal edema noted Skin: Normal Psychiatry: Normal mood  Neurology: Noted extremity weakness/numbness, otherwise no obvious neurologic deficits  noted    Data Reviewed: I have personally reviewed following labs and imaging studies  CBC: Recent Labs  Lab 08/27/24 1854 08/28/24 0117 08/29/24 0523 08/30/24 0327  WBC 7.0 7.1 4.2 5.4  NEUTROABS  --  5.3 2.8 3.5  HGB 15.2* 13.4 12.1 12.4  HCT 47.4* 40.0 35.5* 35.8*  MCV 107.7* 102.0* 98.9 99.2  PLT 215 196 187 186   Basic Metabolic Panel: Recent Labs  Lab 08/27/24 1854 08/27/24 2100 08/28/24 0117 08/29/24 0523 08/30/24 0327 08/31/24 0234 09/01/24 0205  NA 134*  --  136 136 136 138  --   K 3.3*  --  3.4* 4.4 4.2 4.2  --   CL 96*  --  93* 96* 98 99  --   CO2 23  --  25 29 27  33*  --   GLUCOSE 91  --  106* 104* 96 102*  --   BUN 5*  --  5* <5* 5* 5*  --   CREATININE 0.59  --  0.59 0.50 0.54 0.56  --   CALCIUM  8.5*  --  8.4* 8.4* 8.5* 8.4*  --   MG  --    < > 2.1 1.6* 1.7 1.6* 1.9  PHOS  --   --   --  2.9  --   --   --    < > = values in this interval not displayed.   GFR: Estimated Creatinine Clearance: 76.9 mL/min (by C-G formula based on SCr of 0.56 mg/dL). Liver Function Tests: Recent Labs  Lab 08/27/24 1854 08/28/24 0117 08/29/24 0523 08/30/24 0327 08/31/24 0234  AST 81* 68* 55* 55* 65*  ALT 23 20 17 18 17   ALKPHOS 95 88 71 70 82  BILITOT 0.9 0.9 0.8 0.7 0.5  PROT 6.8 5.8* 5.8* 6.5 6.9  ALBUMIN 2.8* 2.5* 2.2* 2.2* 2.0*   No results for input(s): LIPASE, AMYLASE in the last 168 hours.  No results for input(s): AMMONIA in the last 168 hours. Coagulation Profile: No results for input(s): INR, PROTIME in the last 168 hours. Cardiac Enzymes: No results for input(s): CKTOTAL, CKMB, CKMBINDEX, TROPONINI in the last 168 hours. BNP (last 3 results) No results for input(s): PROBNP in the last 8760 hours. HbA1C: No results for input(s): HGBA1C in the last 72 hours. CBG: No results for input(s): GLUCAP in the last 168 hours. Lipid Profile: No results for input(s): CHOL, HDL, LDLCALC, TRIG, CHOLHDL, LDLDIRECT in the  last 72 hours. Thyroid  Function Tests: No results for input(s): TSH, T4TOTAL, FREET4, T3FREE, THYROIDAB in the last 72 hours. Anemia Panel: No results for input(s): VITAMINB12, FOLATE, FERRITIN, TIBC, IRON, RETICCTPCT in the last 72 hours. Sepsis Labs: No results for input(s): PROCALCITON, LATICACIDVEN in the last 168 hours.  Recent Results (from the past 240 hours)  MRSA Next Gen by PCR, Nasal     Status: None   Collection Time: 08/28/24  3:08 AM   Specimen: Nasal Mucosa; Nasal Swab  Result Value Ref Range Status   MRSA by PCR Next Gen NOT DETECTED NOT DETECTED Final    Comment: (NOTE) The GeneXpert MRSA Assay (FDA approved for NASAL specimens only), is one component of a comprehensive MRSA colonization surveillance program. It is not intended to diagnose MRSA infection nor to guide or monitor treatment for MRSA infections. Test performance is not FDA approved in  patients less than 26 years old. Performed at [facility], [address], [place], [place] [address]   CSF culture w Gram Stain     Status: None   Collection Time: 08/28/24 10:44 AM   Specimen: PATH Cytology CSF; Cerebrospinal Fluid  Result Value Ref Range Status   Specimen Description CSF  Final   Special Requests NONE  Final   Gram Stain NO WBC SEEN NO ORGANISMS SEEN CYTOSPIN SMEAR   Final   Culture   Final    NO GROWTH 3 DAYS Performed at [facility], [address] [place], [place] [address]    Report Status 08/31/2024 FINAL  Final    Radiology Studies: DG Swallowing Func-Speech Pathology Result Date: 08/31/2024 Table formatting from the original result was not included. Modified Barium Swallow Study Patient Details Name: Deanna Goodwin MRN: [id number] Date of Birth: 12/29/1960 Today's Date: 08/31/2024 HPI/PMH: HPI: Ms. Pylant is a 63 y.o. female presenting from home on 08/27/24 with generalized aches and weakness. Current workup is ongoing for Guillan-Barre Syndrome and  is currently undergoing IVIG tx.She recently presented to the ED on 08/23/2024 with similar symptoms and underwent a MRI brain, which showed no evidence of acute process. PmHx significant for HTN, neuropathy, and seizures. SLP consulted for clinical swallow evaluation due to increasing oral weakness and concerns for reduced oral transit of solids. Observed large oral mass vs edema on hard palate. Pt aware of this oral abnormality and reported it has increased in size over the past two years. 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 Clinical Impression: Clinical Impression: Pt presents with a mild oral dysphagia. She is protecting her airway - there was no aspiration. There is some decreased mobility of the larynx, leading to occasional transient penetration of thin liquids (PAS 2- considered WFL). There is slowed manipulation and propulsion of solid foods - potentially related to neuromuscular impact of GB or a compensation for the large bony outgrowths on the hard palate (tori palatini).  Pt is safe to continue a PO diet -recommend continuing dysphagia 2/thin liquds for now.  SLP will follow for diet advancement/education as needed. Pt would benefit from f/u with her dentist to address the palatal issues s/p D/C. Factors that may increase risk of adverse event in presence of aspiration Noe & Lianne 2021): No data recorded Recommendations/Plan: Swallowing Evaluation Recommendations Swallowing Evaluation Recommendations Recommendations: PO diet PO Diet Recommendation: Dysphagia 2 (Finely chopped); Thin liquids (Level 0) Liquid Administration via: Cup; Straw Medication Administration: Whole meds with liquid Supervision: Staff to assist with self-feeding Swallowing strategies  : Small bites/sips Postural changes: Position pt fully upright for meals Oral care recommendations: Oral care BID (2x/day) Treatment Plan Treatment Plan Treatment recommendations: Therapy as  outlined in treatment plan below Follow-up recommendations: No SLP follow up Treatment frequency: Min 2x/week Treatment duration: 1 week Interventions: Diet toleration management by SLP; Trials of upgraded texture/liquids; Patient/family education Recommendations Recommendations for follow up therapy are one component of a multi-disciplinary discharge planning process, led by the attending physician.  Recommendations may be updated based on patient status, additional functional criteria and insurance authorization. Assessment: Orofacial Exam: Orofacial Exam Oral Cavity: Oral Hygiene: WFL Oral Cavity - Dentition: Adequate natural dentition Orofacial Anatomy: Other (comment) (see HPI) Oral Motor/Sensory Function: WFL Anatomy: Anatomy: Other (Comment); Suspected cervical osteophytes (tori palatini; osteophytes not dx'd on CT cervical spine) Boluses Administered: Boluses Administered Boluses Administered: Thin liquids (Level 0); Mildly thick liquids (Level 2, nectar  thick); Puree; Solid  Oral Impairment Domain: Oral Impairment Domain Lip Closure: No labial escape Tongue control during bolus hold: Cohesive bolus between tongue to palatal seal Bolus preparation/mastication: Slow prolonged chewing/mashing with complete recollection Bolus transport/lingual motion: Slow tongue motion Oral residue: Trace residue lining oral structures Location of oral residue : Palate; Tongue Initiation of pharyngeal swallow : Pyriform sinuses  Pharyngeal Impairment Domain: Pharyngeal Impairment Domain Soft palate elevation: No bolus between soft palate (SP)/pharyngeal wall (PW) Laryngeal elevation: Partial superior movement of thyroid  cartilage/partial approximation of arytenoids to epiglottic petiole Anterior hyoid excursion: Complete anterior movement Epiglottic movement: Complete inversion Laryngeal vestibule closure: Complete, no air/contrast in laryngeal vestibule Pharyngeal stripping wave : Present - complete Pharyngeal contraction  (A/P view only): N/A Pharyngoesophageal segment opening: Complete distension and complete duration, no obstruction of flow Tongue base retraction: No contrast between tongue base and posterior pharyngeal wall (PPW) Pharyngeal residue: Complete pharyngeal clearance Location of pharyngeal residue: N/A  Esophageal Impairment Domain: Esophageal Impairment Domain Esophageal clearance upright position: Esophageal retention Pill: Pill Consistency administered: Thin liquids (Level 0) Thin liquids (Level 0): [facility] Penetration/Aspiration Scale Score: Penetration/Aspiration Scale Score 2.  Material enters airway, remains ABOVE vocal cords then ejected out: Mildly thick liquids (Level 2, nectar thick); Puree; Solid; Pill 3.  Material enters airway, remains ABOVE vocal cords and not ejected out: Thin liquids (Level 0) Compensatory Strategies: Compensatory Strategies Compensatory strategies: Yes Straw: Effective (n)   General Information: Caregiver present: No  Diet Prior to this Study: Dysphagia 2 (finely chopped); Thin liquids (Level 0)   Temperature : Normal   Respiratory Status: WFL   Supplemental O2: None (Room air)   History of Recent Intubation: No  Behavior/Cognition: Alert; Cooperative Self-Feeding Abilities: Able to self-feed; Needs assist with self-feeding Baseline vocal quality/speech: Hypophonia/low volume No data recorded Volitional Swallow: Able to elicit Exam Limitations: No limitations Goal Planning: Prognosis for improved oropharyngeal function: Good No data recorded No data recorded No data recorded No data recorded Pain: Pain Assessment Pain Assessment: 0-10 Pain Score: 10 Pain Location: bil feet - brief stabs of pain Pain Intervention(s): Limited activity within patient's tolerance End of Session: Start Time:SLP Start Time (ACUTE ONLY): 1330 Stop Time: SLP Stop Time (ACUTE ONLY): 1357 Time Calculation:SLP Time Calculation (min) (ACUTE ONLY): 27 min Charges: SLP Evaluations $ SLP Speech Visit: 1 Visit SLP  Evaluations $MBS Swallow: 1 Procedure SLP visit diagnosis: SLP Visit Diagnosis: Dysphagia, oral phase (R13.11) Past Medical History: Past Medical History: Diagnosis Date  Hypertension   Kidney stones   Seizures (HCC)  Past Surgical History: Past Surgical History: Procedure Laterality Date  CYSTOSCOPY WITH RETROGRADE PYELOGRAM, URETEROSCOPY AND STENT PLACEMENT Right 06/26/2014  Procedure: CYSTOSCOPY WITH STENT PLACEMENT;  Surgeon: Gretel Ferrara, MD;  Location: [facility];  Service: Urology;  Laterality: Right;  KIDNEY STONE SURGERY Left 2013/2015  LITHOTRIPSY   [name] 08/31/2024, 11:04 AM  Scheduled Meds:  enoxaparin  (LOVENOX ) injection  40 mg Subcutaneous Q24H   feeding supplement  237 mL Oral BID BM   folic acid   1 mg Oral Daily   gabapentin   400 mg Oral TID   And   gabapentin   400 mg Oral QHS   lidocaine    Topical BID    morphine  injection  2 mg Intravenous Once   polyethylene glycol  17 g Oral BID   senna-docusate  1 tablet Oral BID   [START ON 09/05/2024] thiamine  (VITAMIN B1) injection  100 mg Intravenous Daily   Continuous Infusions:  Immune Globulin  10% 158 mL/hr  at 08/31/24 2351   thiamine  (VITAMIN B1) injection 250 mg (09/01/24 0905)    LOS: 4 days   Lebron JINNY Cage, MD [facility] Available via Epic secure chat 7am-7pm After these hours, please refer to coverage provider listed on [email or web address] 09/01/2024, 1:11 PM

## 2024-09-01 NOTE — TOC Progression Note (Signed)
 Transition of Care [facility]) - Progression Note    Patient Details  Name: Deanna Goodwin MRN: [id number] Date of Birth: 1961-07-24  [facility] CM/SW Contact  Uziel Covault [place], [place] Phone Number: 09/01/2024, 2:00 PM  Clinical Narrative:     Met with patient a beside to review bed offers. Patient accepts bed offer from [facility]. [place]-admissions coordinator contacted and informed of bed selection. She will start insurance authorization on Monday.  Ronita Hargreaves, LCSW Transition of Care    Expected Discharge Plan: Skilled Nursing Facility Barriers to Discharge: Continued Medical Work up, English As A Second Language Teacher, SNF Pending bed offer               Expected Discharge Plan and Services In-house Referral: Clinical Social Work Discharge Planning Services: [employer] Consult Post Acute Care Choice: Skilled Nursing Facility Living arrangements for the past 2 months: Single Family Home                                       Social Drivers of Health (SDOH) Interventions SDOH Screenings   Food Insecurity: No Food Insecurity (08/28/2024)  Housing: High Risk (08/28/2024)  Transportation Needs: No Transportation Needs (08/28/2024)  Utilities: At Risk (08/28/2024)  Alcohol Screen: Low Risk  (09/22/2023)  Depression (PHQ2-9): High Risk (09/22/2023)  Financial Resource Strain: Medium Risk (09/22/2023)  Physical Activity: Inactive (09/22/2023)  Social Connections: Moderately Integrated (09/22/2023)  Stress: No Stress Concern Present (09/22/2023)  Tobacco Use: High Risk (08/28/2024)  Health Literacy: Adequate Health Literacy (09/22/2023)    Readmission Risk Interventions     No data to display

## 2024-09-01 NOTE — Progress Notes (Addendum)
 NEUROLOGY CONSULT FOLLOW UP NOTE   Date of service: September 01, 2024 Patient Name: Deanna Goodwin MRN:  [id number] DOB:  1961/06/26  Interval Hx/subjective   Pain is improved but still waking up overnight with pain. Today is her last dose of IVIG.  Vitals   Vitals:   09/01/24 0335 09/01/24 0607 09/01/24 0808 09/01/24 0847  BP: 137/74  (!) 140/78 (!) 140/78  Pulse: 94  (!) 108 (!) 111  Resp: 16  16 12   Temp: 98.5 F (36.9 C)  98.9 F (37.2 C)   TempSrc: Oral  Oral   SpO2: 100%   100%  Weight:  86.3 kg    Height:         Body mass index is 33.7 kg/m.  Physical Exam   Constitutional: Appears well-developed and well-nourished.  Psych: Affect blunted Eyes: No scleral injection.  HENT: No OP obstrucion.  Head: Normocephalic.  Cardiovascular: Normal rate and regular rhythm.  Respiratory: Effort normal, non-labored breathing.  Skin: WDI.   Neurologic Examination    NEURO:  Mental Status: Patient is alert and oriented to person to place time and situation but does have some forgetfulness Speech/Language: speech is with very mild dysarthria and no aphasia  Cranial Nerves:  II: PERRL.  III, IV, VI: EOMI. Eyelids elevate symmetrically.  V: Sensation is intact to light touch and symmetrical to face.  VII: Smile is symmetrical.  VIII: hearing intact to voice. IX, X: Voice is mildly dysarthric XII: tongue is midline without fasciculations. Motor:  4 -/5 strength to bilateral upper extremities, proximal and distal,  3/5 strength hip flexion, 2/5 strength knee flexion, knee extension, dorsiflexion and plantarflexion Tone: is normal and bulk is normal Sensation- Intact to light touch bilaterally but diminished in bilateral upper and lower extremities, worse in the hands and feet and improving at knee and upper arm level DTRs: Absent Gait- deferred   Medications  Current Facility-Administered Medications:    acetaminophen  (TYLENOL ) tablet 650 mg, 650 mg, Oral, Q6H  PRN, 650 mg at 09/01/24 0900 **OR** acetaminophen  (TYLENOL ) suppository 650 mg, 650 mg, Rectal, Q6H PRN, Howerter, Justin B, DO   cyclobenzaprine  (FLEXERIL ) tablet 5 mg, 5 mg, Oral, TID PRN, Ezenduka, Nkeiruka J, MD, 5 mg at 08/31/24 0409   enoxaparin  (LOVENOX ) injection 40 mg, 40 mg, Subcutaneous, Q24H, Ezenduka, Nkeiruka J, MD, 40 mg at 09/01/24 0900   feeding supplement (ENSURE PLUS HIGH PROTEIN) liquid 237 mL, 237 mL, Oral, BID BM, Ezenduka, Nkeiruka J, MD, 237 mL at 08/31/24 1548   folic acid  (FOLVITE ) tablet 1 mg, 1 mg, Oral, Daily, Jolinda Pinkstaff, MD, 1 mg at 09/01/24 0900   gabapentin  (NEURONTIN ) capsule 400 mg, 400 mg, Oral, TID **AND** gabapentin  (NEURONTIN ) capsule 400 mg, 400 mg, Oral, QHS, Tyrail Grandfield, MD   hydrALAZINE  (APRESOLINE ) injection 10 mg, 10 mg, Intravenous, Q8H PRN, Ezenduka, Nkeiruka J, MD   ibuprofen  (ADVIL ) tablet 600 mg, 600 mg, Oral, Q6H PRN, Sheikh, Omair Latif, DO, 600 mg at 09/01/24 0900   Immune Globulin  10% (PRIVIGEN ) IV infusion 25 g, 400 mg/kg (Adjusted), Intravenous, Q24 Hr x 5, Karely Hurtado, MD, Last Rate: 158 mL/hr at 08/31/24 2351, Infusion Verify at 08/31/24 2351   lidocaine  (LMX) 4 % cream, , Topical, BID, Lachelle Rissler, MD, 1 Application at 09/01/24 0901   melatonin tablet 3 mg, 3 mg, Oral, QHS PRN, Howerter, Justin B, DO, 3 mg at 08/31/24 2243   morphine  (PF) 2 MG/ML injection 2 mg, 2 mg, Intravenous, Once, Azyiah Bo, MD  nicotine  (NICODERM CQ  - dosed in mg/24 hours) patch 14 mg, 14 mg, Transdermal, Daily PRN, Howerter, Justin B, DO   ondansetron  (ZOFRAN ) injection 4 mg, 4 mg, Intravenous, Q6H PRN, Howerter, Justin B, DO   polyethylene glycol (MIRALAX  / GLYCOLAX ) packet 17 g, 17 g, Oral, BID, Sheikh, Omair Latif, DO, 17 g at 09/01/24 9096   senna-docusate (Senokot-S) tablet 1 tablet, 1 tablet, Oral, BID, Sheikh, Omair Latif, DO, 1 tablet at 09/01/24 0900   [COMPLETED] thiamine  (VITAMIN B1) 500 mg in sodium chloride  0.9 % 50  mL IVPB, 500 mg, Intravenous, Q8H, Last Rate: 110 mL/hr at 08/29/24 2152, 500 mg at 08/29/24 2152 **FOLLOWED BY** thiamine  (VITAMIN B1) 250 mg in sodium chloride  0.9 % 50 mL IVPB, 250 mg, Intravenous, Daily, Last Rate: 105 mL/hr at 09/01/24 0905, 250 mg at 09/01/24 0905 **FOLLOWED BY** [START ON 09/05/2024] thiamine  (VITAMIN B1) injection 100 mg, 100 mg, Intravenous, Daily, Vanessa Robert, MD  Labs and Diagnostic Imaging   CBC:  Recent Labs  Lab 08/29/24 0523 08/30/24 0327  WBC 4.2 5.4  NEUTROABS 2.8 3.5  HGB 12.1 12.4  HCT 35.5* 35.8*  MCV 98.9 99.2  PLT 187 186    Basic Metabolic Panel:  Lab Results  Component Value Date   NA 138 08/31/2024   K 4.2 08/31/2024   CO2 33 (H) 08/31/2024   GLUCOSE 102 (H) 08/31/2024   BUN 5 (L) 08/31/2024   CREATININE 0.56 08/31/2024   CALCIUM  8.4 (L) 08/31/2024   GFRNONAA >60 08/31/2024   GFRAA >60 10/16/2017   Lipid Panel:  Lab Results  Component Value Date   LDLCALC 87 12/20/2016   HgbA1c: No results found for: HGBA1C Urine Drug Screen:     Component Value Date/Time   LABOPIA NONE DETECTED 08/27/2024 2014   COCAINSCRNUR NONE DETECTED 08/27/2024 2014   LABBENZ NONE DETECTED 08/27/2024 2014   AMPHETMU NONE DETECTED 08/27/2024 2014   THCU POSITIVE (A) 08/27/2024 2014   LABBARB NONE DETECTED 08/27/2024 2014    Alcohol Level     Component Value Date/Time   [facility] <15 08/27/2024 2100    MRI Brain and cervical spine (Personally reviewed): No acute abnormalities   Assessment   Jeniece E Holzworth is a 63 y.o. female with history of hypertension who presents with ascending weakness and numbness of bilateral upper and lower extremities.  Patient reports that symptoms started about a week ago and have been steadily increasing.  She does report some difficulty swallowing and speaking as well as mild dyspnea.  Given ascending weakness and numbness as well as areflexia, Guillan Barre syndrome is high on the differential.  Lumbar puncture  under fluoroscopy with albuminocytologic dissociation which is consistent with GBS.  Recommendations  - Every 12 hour NIF and vital capacity - Continue treatment with IVIG 400mg /Kg daily x 5 doses. Ends 12/6.  -- Gabapentin  increased to 400 in AM and PM and 800 at bedtime. Lidocaine  cream ordered as twice daily scheduled. - follow up with either Dr. Venetia Potters or Tonita Blanch with [facility] outpatient for EMG/NCS. - Neurology inpatient team will signoff. ______________________________________________________________________  Plan discussed with Dr. Donnamarie with the Hospitalist team over secure chat.  Sai Moura, MD [facility] [phone]   If 7pm to 7am, please call on call as listed on AMION.

## 2024-09-01 NOTE — Plan of Care (Signed)

## 2024-09-02 ENCOUNTER — Inpatient Hospital Stay (HOSPITAL_COMMUNITY)

## 2024-09-02 DIAGNOSIS — G610 Guillain-Barre syndrome: Secondary | ICD-10-CM | POA: Diagnosis not present

## 2024-09-02 DIAGNOSIS — R531 Weakness: Secondary | ICD-10-CM | POA: Diagnosis not present

## 2024-09-02 LAB — CBC
HCT: 35.7 % — ABNORMAL LOW (ref 36.0–46.0)
Hemoglobin: 12 g/dL (ref 12.0–15.0)
MCH: 34 pg (ref 26.0–34.0)
MCHC: 33.6 g/dL (ref 30.0–36.0)
MCV: 101.1 fL — ABNORMAL HIGH (ref 80.0–100.0)
Platelets: 178 K/uL (ref 150–400)
RBC: 3.53 MIL/uL — ABNORMAL LOW (ref 3.87–5.11)
RDW: 13.4 % (ref 11.5–15.5)
WBC: 10.3 K/uL (ref 4.0–10.5)
nRBC: 0 % (ref 0.0–0.2)

## 2024-09-02 LAB — BASIC METABOLIC PANEL WITH GFR
Anion gap: 12 (ref 5–15)
BUN: <5 mg/dL — ABNORMAL LOW (ref 8–23)
CO2: 28 mmol/L (ref 22–32)
Calcium: 9 mg/dL (ref 8.9–10.3)
Chloride: 96 mmol/L — ABNORMAL LOW (ref 98–111)
Creatinine, Ser: 0.52 mg/dL (ref 0.44–1.00)
GFR, Estimated: >60 mL/min (ref >60)
Glucose, Bld: 111 mg/dL — ABNORMAL HIGH (ref 70–99)
Potassium: 4 mmol/L (ref 3.5–5.1)
Sodium: 136 mmol/L (ref 135–145)

## 2024-09-02 LAB — D-DIMER, QUANTITATIVE: D-Dimer, Quant: 0.72 ug{FEU}/mL — ABNORMAL HIGH (ref 0.00–0.50)

## 2024-09-02 LAB — BRAIN NATRIURETIC PEPTIDE: B Natriuretic Peptide: 72.5 pg/mL (ref 0.0–100.0)

## 2024-09-02 LAB — MAGNESIUM: Magnesium: 1.6 mg/dL — ABNORMAL LOW (ref 1.7–2.4)

## 2024-09-02 MED ORDER — IOHEXOL 350 MG/ML SOLN
75.0000 mL | Freq: Once | INTRAVENOUS | Status: AC | PRN
  Administered 2024-09-02: 75 mL via INTRAVENOUS

## 2024-09-02 MED ORDER — PIPERACILLIN-TAZOBACTAM 3.375 G IVPB
3.3750 g | Freq: Three times a day (TID) | INTRAVENOUS | Status: DC
  Administered 2024-09-02 – 2024-09-05 (×8): 3.375 g via INTRAVENOUS
  Filled 2024-09-02 (×8): qty 50

## 2024-09-02 MED ORDER — MAGNESIUM SULFATE 4 GM/100ML IV SOLN
4.0000 g | Freq: Once | INTRAVENOUS | Status: AC
  Administered 2024-09-02: 4 g via INTRAVENOUS
  Filled 2024-09-02: qty 100

## 2024-09-02 MED ORDER — PIPERACILLIN-TAZOBACTAM 3.375 G IVPB 30 MIN
3.3750 g | Freq: Once | INTRAVENOUS | Status: AC
  Administered 2024-09-02: 3.375 g via INTRAVENOUS
  Filled 2024-09-02 (×2): qty 50

## 2024-09-02 MED ORDER — IPRATROPIUM BROMIDE 0.02 % IN SOLN
0.5000 mg | Freq: Four times a day (QID) | RESPIRATORY_TRACT | Status: DC
  Administered 2024-09-02: 0.5 mg via RESPIRATORY_TRACT
  Filled 2024-09-02: qty 2.5

## 2024-09-02 MED ORDER — AMLODIPINE BESYLATE 5 MG PO TABS
5.0000 mg | ORAL_TABLET | Freq: Every day | ORAL | Status: DC
  Administered 2024-09-02 – 2024-09-06 (×5): 5 mg via ORAL
  Filled 2024-09-02 (×5): qty 1

## 2024-09-02 MED ORDER — IPRATROPIUM BROMIDE 0.02 % IN SOLN
0.5000 mg | Freq: Two times a day (BID) | RESPIRATORY_TRACT | Status: DC
  Administered 2024-09-03: 0.5 mg via RESPIRATORY_TRACT
  Filled 2024-09-02: qty 2.5

## 2024-09-02 NOTE — Progress Notes (Signed)
 Pharmacy Antibiotic Note  Deanna Goodwin is a 63 y.o. female admitted on 08/27/2024 with pneumonia.  Pharmacy has been consulted for zosyn  dosing.  Empiric zosyn  for aspiration PNA.   Wbc wnl, AF  Plan: Zosyn  3.375g IV x1 then q8 hr Rx signs off  Height: 5' 3 (160 cm) Weight: 85.2 kg (187 lb 13.3 oz) IBW/kg (Calculated) : 52.4  Temp (24hrs), Avg:98.3 F (36.8 C), Min:97.8 F (36.6 C), Max:98.7 F (37.1 C)  Recent Labs  Lab 08/27/24 1854 08/28/24 0117 08/29/24 0523 08/30/24 0327 08/31/24 0234 09/02/24 0327 09/02/24 1341  WBC 7.0 7.1 4.2 5.4  --   --  10.3  CREATININE 0.59 0.59 0.50 0.54 0.56 0.52  --     Estimated Creatinine Clearance: 76.4 mL/min (by C-G formula based on SCr of 0.52 mg/dL).    No Known Allergies  Antimicrobials this admission: 12/7 zosyn >>   Dose adjustments this admission:   Microbiology results: 12/2 CSF>>neg MRSA neg  Sergio Batch, PharmD, BCIDP, AAHIVP, CPP Infectious Disease Pharmacist 09/02/2024 2:54 PM

## 2024-09-02 NOTE — Progress Notes (Signed)
 PROGRESS NOTE    Deanna Goodwin  FMW:[id number] DOB: [birth year] DOA: 08/27/2024 PCP: Tanda Bleacher, MD    Brief Narrative:  63 year old female with a past medical history significant for but not limited to hypertension history of seizures, and history of nephrolithiasis status post ureteroscopy and stent placement with lithotripsy who presents with a 1 week history of ascending weakness involving all 4 extremities with primarily in bilateral upper extremities. Pt was recently seen in the ED on 11/27 with similar symptoms and brain MRI done showed no acute process and no evidence of infarct. Given her continued worsening and generalized weakness she presented again and neurology was consulted and recommended MRI C-spine which was negative.  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  Neurology on board, started patient on IVIG.   Today, pt noted to be tachycardic, reports she has been drinking a lot coffee. Denies any chest pain or difficulty breathing. Still reporting neuropathic pain in BUE.    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  Noted bilateral nonobstructing nephrolithiasis Outpatient follow up  Essential Hypertension Initially BP soft, currently stable Hydralazine  IV as needed  Chronic Tobacco Abuse Smokes a pack per day for nearly 40 years Advised to quit  C/w Nicotine  14 mg TD Patch and Nicotine  Prolacrilex 2 mg po as needed   Hypoalbuminemia Follow-up   Class I Obesity Lifestyle modification advised    DVT prophylaxis: enoxaparin  (LOVENOX ) injection 40 mg Start: 08/29/24 1045 SCDs Start: 08/28/24 0008    Code Status: Full Code Family Communication: None at bedside  Disposition Plan:  Level of care: Progressive Status is: Inpatient Remains inpatient appropriate because: Level of care  Consultants:  Neurology  Procedures:  LP on 12/2  Antimicrobials:  Anti-infectives (From admission, onward)    None        Objective: Vitals:   09/01/24 1923 09/01/24 2234 09/02/24 0321 09/02/24 0719  BP: 134/75 (!) 147/89 (!) 162/89 (!) 150/94  Pulse: 98 99 (!) 102 (!) 119  Resp: 18 17 16 18   Temp: 97.8 F (36.6 C) 97.8 F (36.6 C) 98.5 F (36.9 C)   TempSrc: Oral Oral Oral Oral  SpO2: 100% 94% 94% 96%  Weight:   85.2 kg   Height:        Intake/Output Summary (Last 24 hours) at 09/02/2024 1045 Last data filed at 09/02/2024 0806 Gross per 24 hour  Intake 903.74 ml  Output 2500 ml  Net -1596.26 ml   Filed Weights   08/31/24 0300 09/01/24 0607 09/02/24 0321  Weight: 85.5 kg 86.3 kg 85.2  kg   Examination: Physical Exam: General: NAD  Cardiovascular: S1, S2 present Respiratory: Diminished breath sounds bilaterally, no accessory muscle use Abdomen: Soft, nontender, nondistended, bowel sounds  present Musculoskeletal: No bilateral pedal edema noted Skin: Normal Psychiatry: Normal mood  Neurology: Noted extremity weakness/numbness, otherwise no obvious neurologic deficits noted    Data Reviewed: I have personally reviewed following labs and imaging studies  CBC: Recent Labs  Lab 08/27/24 1854 08/28/24 0117 08/29/24 0523 08/30/24 0327  WBC 7.0 7.1 4.2 5.4  NEUTROABS  --  5.3 2.8 3.5  HGB 15.2* 13.4 12.1 12.4  HCT 47.4* 40.0 35.5* 35.8*  MCV 107.7* 102.0* 98.9 99.2  PLT 215 196 187 186   Basic Metabolic Panel: Recent Labs  Lab 08/28/24 0117 08/29/24 0523 08/30/24 0327 08/31/24 0234 09/01/24 0205 09/02/24 0327  NA 136 136 136 138  --  136  K 3.4* 4.4 4.2 4.2  --  4.0  CL 93* 96* 98 99  --  96*  CO2 25 29 27  33*  --  28  GLUCOSE 106* 104* 96 102*  --  111*  BUN 5* <5* 5* 5*  --  <5*  CREATININE 0.59 0.50 0.54 0.56  --  0.52  CALCIUM  8.4* 8.4* 8.5* 8.4*  --  9.0  MG 2.1 1.6* 1.7 1.6* 1.9 1.6*  PHOS  --  2.9  --   --   --   --    GFR: Estimated Creatinine Clearance: 76.4 mL/min (by C-G formula based on SCr of 0.52 mg/dL). Liver Function Tests: Recent Labs  Lab 08/27/24 1854 08/28/24 0117 08/29/24 0523 08/30/24 0327 08/31/24 0234  AST 81* 68* 55* 55* 65*  ALT 23 20 17 18 17   ALKPHOS 95 88 71 70 82  BILITOT 0.9 0.9 0.8 0.7 0.5  PROT 6.8 5.8* 5.8* 6.5 6.9  ALBUMIN 2.8* 2.5* 2.2* 2.2* 2.0*   No results for input(s): LIPASE, AMYLASE in the last 168 hours.  No results for input(s): AMMONIA in the last 168 hours. Coagulation Profile: No results for input(s): INR, PROTIME in the last 168 hours. Cardiac Enzymes: No results for input(s): CKTOTAL, CKMB, CKMBINDEX, TROPONINI in the last 168 hours. BNP (last 3 results) No results for input(s): PROBNP in the last 8760 hours. HbA1C: No results for input(s): HGBA1C in the last 72 hours. CBG: No results for input(s): GLUCAP in the last 168 hours. Lipid Profile: No results for  input(s): CHOL, HDL, LDLCALC, TRIG, CHOLHDL, LDLDIRECT in the last 72 hours. Thyroid  Function Tests: No results for input(s): TSH, T4TOTAL, FREET4, T3FREE, THYROIDAB in the last 72 hours. Anemia Panel: No results for input(s): VITAMINB12, FOLATE, FERRITIN, TIBC, IRON, RETICCTPCT in the last 72 hours. Sepsis Labs: No results for input(s): PROCALCITON, LATICACIDVEN in the last 168 hours.  Recent Results (from the past 240 hours)  MRSA Next Gen by PCR, Nasal     Status: None   Collection Time: 08/28/24  3:08 AM   Specimen: Nasal Mucosa; Nasal Swab  Result Value Ref Range Status   MRSA by PCR Next Gen NOT DETECTED NOT DETECTED Final    Comment: (NOTE) The GeneXpert MRSA Assay (FDA approved for NASAL specimens only), is one component of a comprehensive MRSA colonization surveillance program. It is not intended to diagnose MRSA infection nor to guide or monitor treatment for MRSA infections. Test performance is not FDA approved in patients less than 51 years old. Performed at [facility], [address], [place], [place] [address]   CSF culture  w Gram Stain     Status: None   Collection Time: 08/28/24 10:44 AM   Specimen: PATH Cytology CSF; Cerebrospinal Fluid  Result Value Ref Range Status   Specimen Description CSF  Final   Special Requests NONE  Final   Gram Stain NO WBC SEEN NO ORGANISMS SEEN CYTOSPIN SMEAR   Final   Culture   Final    NO GROWTH 3 DAYS Performed at [facility], [address], [place], [place] [address]    Report Status 08/31/2024 FINAL  Final    Radiology Studies: No results found.  Scheduled Meds:  enoxaparin  (LOVENOX ) injection  40 mg Subcutaneous Q24H   feeding supplement  237 mL Oral BID BM   folic acid   1 mg Oral Daily   gabapentin   400 mg Oral TID   And   gabapentin   400 mg Oral QHS   lidocaine    Topical BID   polyethylene glycol  17 g Oral BID   senna-docusate  1 tablet Oral BID   [START ON  09/05/2024] thiamine  (VITAMIN B1) injection  100 mg Intravenous Daily   Continuous Infusions:  magnesium  sulfate bolus IVPB     thiamine  (VITAMIN B1) injection 250 mg (09/02/24 0826)    LOS: 5 days   Lebron JINNY Cage, MD [facility] Available via Epic secure chat 7am-7pm After these hours, please refer to coverage provider listed on [email or web address] 09/02/2024, 10:45 AM

## 2024-09-02 NOTE — Plan of Care (Signed)

## 2024-09-02 NOTE — Progress Notes (Signed)
NIF: >-40 VC: 1.5L with good pt effort

## 2024-09-02 NOTE — Progress Notes (Signed)
 NEUROLOGY CONSULT FOLLOW UP NOTE   Date of service: September 02, 2024 Patient Name: Deanna Goodwin MRN:  [id number] DOB:  [birth year]  Interval Hx/subjective   Gradual improvement, pain is improved but still there. She is more somnolent. NIF of -21 last night with FVC of 1.43. However, this AM, NIF of -35 with FVC of 1.55.  She is speaking in full sentences and is not short of breath. She is tachycardic. She attributes that to   Vitals   Vitals:   09/01/24 2234 09/02/24 0321 09/02/24 0719 09/02/24 1134  BP: (!) 147/89 (!) 162/89 (!) 150/94 (!) 159/90  Pulse: 99 (!) 102 (!) 119 (!) 125  Resp: 17 16 18  (!) 26  Temp: 97.8 F (36.6 C) 98.5 F (36.9 C)  98.7 F (37.1 C)  TempSrc: Oral Oral Oral Oral  SpO2: 94% 94% 96% 94%  Weight:  85.2 kg    Height:         Body mass index is 33.27 kg/m.  Physical Exam   Constitutional: Appears well-developed and well-nourished.  Psych: Affect blunted Eyes: No scleral injection.  HENT: No OP obstrucion.  Head: Normocephalic.  Cardiovascular: Normal rate and regular rhythm.  Respiratory: Effort normal, non-labored breathing.  Skin: WDI.   Neurologic Examination    NEURO:  Mental Status: Patient is alert and oriented to person to place time and situation but does have some forgetfulness Speech/Language: speech is with very mild dysarthria and no aphasia  Cranial Nerves:  II: PERRL.  III, IV, VI: EOMI. Eyelids elevate symmetrically.  V: Sensation is intact to light touch and symmetrical to face.  VII: Smile is symmetrical.  VIII: hearing intact to voice. IX, X: Voice is mildly dysarthric XII: tongue is midline without fasciculations. Motor:  4 -/5 strength to bilateral upper extremities, proximal and distal,  3/5 strength hip flexion, 2/5 strength knee flexion, knee extension, dorsiflexion and plantarflexion Tone: is normal and bulk is normal Sensation- Intact to light touch bilaterally but diminished in bilateral upper and lower  extremities, worse in the hands and feet and improving at knee and upper arm level DTRs: Absent Gait- deferred   Medications  Current Facility-Administered Medications:    acetaminophen  (TYLENOL ) tablet 650 mg, 650 mg, Oral, Q6H PRN, 650 mg at 09/02/24 1058 **OR** acetaminophen  (TYLENOL ) suppository 650 mg, 650 mg, Rectal, Q6H PRN, Howerter, Justin B, DO   amLODipine  (NORVASC ) tablet 5 mg, 5 mg, Oral, Daily, Ezenduka, Nkeiruka J, MD, 5 mg at 09/02/24 1101   cyclobenzaprine  (FLEXERIL ) tablet 5 mg, 5 mg, Oral, TID PRN, Ezenduka, Nkeiruka J, MD, 5 mg at 09/02/24 0602   enoxaparin  (LOVENOX ) injection 40 mg, 40 mg, Subcutaneous, Q24H, Ezenduka, Nkeiruka J, MD, 40 mg at 09/02/24 0955   feeding supplement (ENSURE PLUS HIGH PROTEIN) liquid 237 mL, 237 mL, Oral, BID BM, Ezenduka, Nkeiruka J, MD, 237 mL at 08/31/24 1548   folic acid  (FOLVITE ) tablet 1 mg, 1 mg, Oral, Daily, Romy Mcgue, MD, 1 mg at 09/02/24 9176   gabapentin  (NEURONTIN ) capsule 400 mg, 400 mg, Oral, TID, 400 mg at 09/02/24 9177 **AND** gabapentin  (NEURONTIN ) capsule 400 mg, 400 mg, Oral, QHS, Tracey Stewart, MD, 400 mg at 09/01/24 2230   hydrALAZINE  (APRESOLINE ) injection 10 mg, 10 mg, Intravenous, Q8H PRN, Ezenduka, Nkeiruka J, MD   ibuprofen  (ADVIL ) tablet 600 mg, 600 mg, Oral, Q6H PRN, Sheikh, Omair Latif, DO, 600 mg at 09/02/24 0602   lidocaine  (LMX) 4 % cream, , Topical, BID, Grayson White, MD, 1 Application at  09/02/24 0823   melatonin tablet 3 mg, 3 mg, Oral, QHS PRN, Howerter, Justin B, DO, 3 mg at 09/01/24 2230   nicotine  (NICODERM CQ  - dosed in mg/24 hours) patch 14 mg, 14 mg, Transdermal, Daily PRN, Howerter, Justin B, DO   ondansetron  (ZOFRAN ) injection 4 mg, 4 mg, Intravenous, Q6H PRN, Howerter, Justin B, DO   polyethylene glycol (MIRALAX  / GLYCOLAX ) packet 17 g, 17 g, Oral, BID, Sheikh, Omair Latif, DO, 17 g at 09/02/24 9044   senna-docusate (Senokot-S) tablet 1 tablet, 1 tablet, Oral, BID, Sherrill Cable  Latif, DO, 1 tablet at 09/02/24 0955   [COMPLETED] thiamine  (VITAMIN B1) 500 mg in sodium chloride  0.9 % 50 mL IVPB, 500 mg, Intravenous, Q8H, Last Rate: 110 mL/hr at 08/29/24 2152, 500 mg at 08/29/24 2152 **FOLLOWED BY** thiamine  (VITAMIN B1) 250 mg in sodium chloride  0.9 % 50 mL IVPB, 250 mg, Intravenous, Daily, Last Rate: 105 mL/hr at 09/02/24 0826, 250 mg at 09/02/24 0826 **FOLLOWED BY** [START ON 09/05/2024] thiamine  (VITAMIN B1) injection 100 mg, 100 mg, Intravenous, Daily, Vanessa Robert, MD  Labs and Diagnostic Imaging   CBC:  Recent Labs  Lab 08/29/24 0523 08/30/24 0327  WBC 4.2 5.4  NEUTROABS 2.8 3.5  HGB 12.1 12.4  HCT 35.5* 35.8*  MCV 98.9 99.2  PLT 187 186    Basic Metabolic Panel:  Lab Results  Component Value Date   NA 136 09/02/2024   K 4.0 09/02/2024   CO2 28 09/02/2024   GLUCOSE 111 (H) 09/02/2024   BUN <5 (L) 09/02/2024   CREATININE 0.52 09/02/2024   CALCIUM  9.0 09/02/2024   GFRNONAA >60 09/02/2024   GFRAA >60 10/16/2017   Lipid Panel:  Lab Results  Component Value Date   LDLCALC 87 12/20/2016   HgbA1c: No results found for: HGBA1C Urine Drug Screen:     Component Value Date/Time   LABOPIA NONE DETECTED 08/27/2024 2014   COCAINSCRNUR NONE DETECTED 08/27/2024 2014   LABBENZ NONE DETECTED 08/27/2024 2014   AMPHETMU NONE DETECTED 08/27/2024 2014   THCU POSITIVE (A) 08/27/2024 2014   LABBARB NONE DETECTED 08/27/2024 2014    Alcohol Level     Component Value Date/Time   [facility] <15 08/27/2024 2100    MRI Brain and cervical spine (Personally reviewed): No acute abnormalities   Assessment   Deanna Goodwin is a 63 y.o. female with history of hypertension who presents with ascending weakness and numbness of bilateral upper and lower extremities.  Patient reports that symptoms started about a week ago and have been steadily increasing.  She does report some difficulty swallowing and speaking as well as mild dyspnea.  Given ascending weakness and  numbness as well as areflexia, Guillan Barre syndrome is high on the differential.  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  She is s/p 5 doses of IVIG with modest but notable and gradual improvement.  She was noted to have low NIF and VC last night but that seems to be an outlier as this AM, NIF was -35 with VC of 1.55.  Recommendations  - Every 12 hour NIF and vital capacity - completed 5 doses of IVIG. - continue Gabapentin  400 in AM and PM and 800 at bedtime. Lidocaine  cream ordered as twice daily scheduled. Heating pads PRN. - Next, we can consider starting Cymbalta  60mg  daily for neuropathic pain if needed. - follow up with either Dr. Venetia Potters or Tonita Blanch with [facility] outpatient for EMG/NCS. - Neurology  inpatient team will signoff. ______________________________________________________________________  Plan discussed with Dr. Donnamarie with the Hospitalist team over secure chat.  Meklit Cotta, MD [facility] [phone]   If 7pm to 7am, please call on call as listed on AMION.

## 2024-09-03 DIAGNOSIS — R531 Weakness: Secondary | ICD-10-CM | POA: Diagnosis not present

## 2024-09-03 LAB — CBC WITH DIFFERENTIAL/PLATELET
Abs Immature Granulocytes: 0.03 K/uL (ref 0.00–0.07)
Basophils Absolute: 0 K/uL (ref 0.0–0.1)
Basophils Relative: 0 %
Eosinophils Absolute: 0 K/uL (ref 0.0–0.5)
Eosinophils Relative: 0 %
HCT: 36 % (ref 36.0–46.0)
Hemoglobin: 11.9 g/dL — ABNORMAL LOW (ref 12.0–15.0)
Immature Granulocytes: 0 %
Lymphocytes Relative: 13 %
Lymphs Abs: 1.3 K/uL (ref 0.7–4.0)
MCH: 33.3 pg (ref 26.0–34.0)
MCHC: 33.1 g/dL (ref 30.0–36.0)
MCV: 100.8 fL — ABNORMAL HIGH (ref 80.0–100.0)
Monocytes Absolute: 0.6 K/uL (ref 0.1–1.0)
Monocytes Relative: 6 %
Neutro Abs: 8 K/uL — ABNORMAL HIGH (ref 1.7–7.7)
Neutrophils Relative %: 81 %
Platelets: 197 K/uL (ref 150–400)
RBC: 3.57 MIL/uL — ABNORMAL LOW (ref 3.87–5.11)
RDW: 13.3 % (ref 11.5–15.5)
WBC: 10 K/uL (ref 4.0–10.5)
nRBC: 0 % (ref 0.0–0.2)

## 2024-09-03 LAB — BASIC METABOLIC PANEL WITH GFR
Anion gap: 10 (ref 5–15)
BUN: <5 mg/dL — ABNORMAL LOW (ref 8–23)
CO2: 28 mmol/L (ref 22–32)
Calcium: 8.4 mg/dL — ABNORMAL LOW (ref 8.9–10.3)
Chloride: 95 mmol/L — ABNORMAL LOW (ref 98–111)
Creatinine, Ser: 0.55 mg/dL (ref 0.44–1.00)
GFR, Estimated: >60 mL/min (ref >60)
Glucose, Bld: 123 mg/dL — ABNORMAL HIGH (ref 70–99)
Potassium: 3.6 mmol/L (ref 3.5–5.1)
Sodium: 133 mmol/L — ABNORMAL LOW (ref 135–145)

## 2024-09-03 LAB — MAGNESIUM: Magnesium: 1.5 mg/dL — ABNORMAL LOW (ref 1.7–2.4)

## 2024-09-03 LAB — RESPIRATORY PANEL BY PCR

## 2024-09-03 LAB — RESP PANEL BY RT-PCR (RSV, FLU A&B, COVID)  RVPGX2
Influenza A by PCR: NEGATIVE
Influenza B by PCR: NEGATIVE
Resp Syncytial Virus by PCR: NEGATIVE
SARS Coronavirus 2 by RT PCR: NEGATIVE

## 2024-09-03 MED ORDER — IPRATROPIUM-ALBUTEROL 0.5-2.5 (3) MG/3ML IN SOLN
3.0000 mL | Freq: Three times a day (TID) | RESPIRATORY_TRACT | Status: DC
  Administered 2024-09-03 – 2024-09-05 (×6): 3 mL via RESPIRATORY_TRACT
  Filled 2024-09-03 (×7): qty 3

## 2024-09-03 MED ORDER — DM-GUAIFENESIN ER 30-600 MG PO TB12
1.0000 | ORAL_TABLET | Freq: Two times a day (BID) | ORAL | Status: DC
  Administered 2024-09-03 – 2024-09-06 (×7): 1 via ORAL
  Filled 2024-09-03 (×7): qty 1

## 2024-09-03 MED ORDER — ACETYLCYSTEINE 20 % IN SOLN
2.0000 mL | Freq: Three times a day (TID) | RESPIRATORY_TRACT | Status: DC
  Administered 2024-09-03 (×2): 2 mL via RESPIRATORY_TRACT
  Filled 2024-09-03 (×2): qty 4

## 2024-09-03 MED ORDER — MAGNESIUM SULFATE 4 GM/100ML IV SOLN
4.0000 g | Freq: Once | INTRAVENOUS | Status: AC
  Administered 2024-09-03: 4 g via INTRAVENOUS
  Filled 2024-09-03: qty 100

## 2024-09-03 MED ORDER — ACETYLCYSTEINE 20 % IN SOLN
2.0000 mL | Freq: Three times a day (TID) | RESPIRATORY_TRACT | Status: DC
  Administered 2024-09-04 – 2024-09-05 (×4): 2 mL via RESPIRATORY_TRACT
  Filled 2024-09-03 (×6): qty 4

## 2024-09-03 MED ORDER — BUDESONIDE 0.25 MG/2ML IN SUSP
0.2500 mg | Freq: Two times a day (BID) | RESPIRATORY_TRACT | Status: DC
  Administered 2024-09-03 – 2024-09-06 (×7): 0.25 mg via RESPIRATORY_TRACT
  Filled 2024-09-03 (×7): qty 2

## 2024-09-03 MED ORDER — ARFORMOTEROL TARTRATE 15 MCG/2ML IN NEBU
15.0000 ug | INHALATION_SOLUTION | Freq: Two times a day (BID) | RESPIRATORY_TRACT | Status: DC
  Administered 2024-09-03 – 2024-09-06 (×7): 15 ug via RESPIRATORY_TRACT
  Filled 2024-09-03 (×7): qty 2

## 2024-09-03 NOTE — Progress Notes (Addendum)
 PT performed NIF and VC with ok effort this morning. Performed x 3.   VC 1.36 NIF -25

## 2024-09-03 NOTE — Progress Notes (Signed)
 Physical Therapy Treatment Patient Details Name: Deanna Goodwin MRN: [id number] DOB: 27-Aug-1961 Today's Date: 09/03/2024   History of Present Illness Pt is a 63 y.o. female presenting 12/1 with weakness. Found to have hypokalemia, hypomagnesia. 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 PMH: HTN, neuropathy.    PT Comments  The pt was agreeable to session with focus on progressing OOB activity tolerance and strength. Pt continues to benefit from use of stedy due to extensive BLE and core weakness, but was able to complete multiple sit-stands with modA of 2 and stedy to block bilateral knees. The pt remains highly motivated to progress functional strength and stability, will benefit from maximal skilled PT to facilitate return to prior level of mobility and independence.     If plan is discharge home, recommend the following: Two people to help with walking and/or transfers;A lot of help with bathing/dressing/bathroom;Assistance with cooking/housework;Assist for transportation;Help with stairs or ramp for entrance   Can travel by private vehicle     No  Equipment Recommendations  Wheelchair (measurements PT);Wheelchair cushion (measurements PT);Hoyer lift;Hospital bed    Recommendations for Other Services       Precautions / Restrictions Precautions Precautions: Fall Recall of Precautions/Restrictions: Intact Restrictions Weight Bearing Restrictions Per Provider Order: No     Mobility  Bed Mobility Overal bed mobility: Needs Assistance Bed Mobility: Rolling, Sidelying to Sit Rolling: Min assist, Used rails Sidelying to sit: Mod assist, +2 for physical assistance       General bed mobility comments: requiring assistance with BLEs and trunk    Transfers Overall transfer level: Needs assistance Equipment used: Ambulation equipment used Transfers: Sit  to/from Stand, Bed to chair/wheelchair/BSC Sit to Stand: Mod assist, +2 physical assistance           General transfer comment: mod assist +2 for sit to stand from EOB with use of bed pad with patient progressing to min assist +2 from Stedy pads and recliner, completed x3 from low surface and x3 from stedy flaps. cues for hip extension and posture, no overt lateral LOB in stedy Transfer via Lift Equipment: Stedy  Ambulation/Gait               General Gait Details: unable to initiate steps in stedy at this time due to fatigue    Balance Overall balance assessment: Needs assistance Sitting-balance support: Single extremity supported, Feet supported Sitting balance-Leahy Scale: Poor Sitting balance - Comments: right lateral leaning   Standing balance support: Bilateral upper extremity supported, Reliant on assistive device for balance Standing balance-Leahy Scale: Poor Standing balance comment: reliant on Stedy for support both at BUE and at knees to prevent buckling, no lateral LOB in standing                            Communication Communication Communication: Impaired Factors Affecting Communication: Reduced clarity of speech  Cognition Arousal: Alert Behavior During Therapy: WFL for tasks assessed/performed   PT - Cognitive impairments: Memory, Awareness, Problem solving                       PT - Cognition Comments: pt with generally increased time processing and limited verbal contributions, was able to answer questions with increased encouragement and follows simple commands. needs cues to attend to LUE positioning Following commands: Impaired Following commands impaired: Follows one step commands with  increased time    Cueing Cueing Techniques: Verbal cues, Gestural cues  Exercises General Exercises - Lower Extremity Long Arc Quad: AROM, Both, 10 reps, Seated Hip Flexion/Marching: AROM, Both, 10 reps, Seated Heel Raises: AROM, Both, 10 reps,  Seated Other Exercises Other Exercises: sit-stand from stedy flaps x3    General Comments General comments (skin integrity, edema, etc.): VSS on 2L during session      Pertinent Vitals/Pain Pain Assessment Pain Assessment: Faces Faces Pain Scale: Hurts even more Pain Location: Bil feet and hands Pain Descriptors / Indicators: Pins and needles, Sore, Burning Pain Intervention(s): Limited activity within patient's tolerance, Monitored during session, Repositioned     PT Goals (current goals can now be found in the care plan section) Acute Rehab PT Goals Patient Stated Goal: Get well, improve strength PT Goal Formulation: With patient Time For Goal Achievement: 09/11/24 Potential to Achieve Goals: Good Progress towards PT goals: Progressing toward goals    Frequency    Min 3X/week      PT Plan      Co-evaluation PT/OT/SLP Co-Evaluation/Treatment: Yes Reason for Co-Treatment: For patient/therapist safety;To address functional/ADL transfers PT goals addressed during session: Mobility/safety with mobility;Balance;Proper use of DME;Strengthening/ROM OT goals addressed during session: ADL's and self-care      AM-PAC PT 6 Clicks Mobility   Outcome Measure  Help needed turning from your back to your side while in a flat bed without using bedrails?: A Little Help needed moving from lying on your back to sitting on the side of a flat bed without using bedrails?: A Lot Help needed moving to and from a bed to a chair (including a wheelchair)?: Total Help needed standing up from a chair using your arms (e.g., wheelchair or bedside chair)?: A Lot Help needed to walk in hospital room?: Total Help needed climbing 3-5 steps with a railing? : Total 6 Click Score: 10    End of Session Equipment Utilized During Treatment: Gait belt;Oxygen Activity Tolerance: Patient limited by fatigue;Patient tolerated treatment well Patient left: in bed;with call bell/phone within reach;with bed  alarm set;with family/visitor present Nurse Communication: Mobility status;Need for lift equipment PT Visit Diagnosis: Unsteadiness on feet (R26.81);Other abnormalities of gait and mobility (R26.89);Muscle weakness (generalized) (M62.81);History of falling (Z91.81);Difficulty in walking, not elsewhere classified (R26.2);Other symptoms and signs involving the nervous system (R29.898)     Time: 9151-9083 PT Time Calculation (min) (ACUTE ONLY): 28 min  Charges:    $Therapeutic Exercise: 8-22 mins PT General Charges $$ ACUTE PT VISIT: 1 Visit                     Izetta Call, PT, DPT   Acute Rehabilitation Department Office [phone] Secure Chat Communication Preferred   Izetta JULIANNA Call 09/03/2024, 12:19 PM

## 2024-09-03 NOTE — Progress Notes (Signed)
 PROGRESS NOTE    Deanna Goodwin  FMW:[id number] DOB: [birth year] DOA: 08/27/2024 PCP: Tanda Bleacher, MD    Brief Narrative:  63 year old female with a past medical history significant for but not limited to hypertension history of seizures, and history of nephrolithiasis status post ureteroscopy and stent placement with lithotripsy who presents with a 1 week history of ascending weakness involving all 4 extremities with primarily in bilateral upper extremities. Pt was recently seen in the ED on 11/27 with similar symptoms and brain MRI done showed no acute process and no evidence of infarct. Given her continued worsening and generalized weakness she presented again and neurology was consulted and recommended MRI C-spine which was negative.  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  Neurology on board, started patient on IVIG.    Today, patient still with shortness of breath, requiring about 4 L of oxygen, appears very deconditioned.  Noted to be congested with a wet cough, denies any chest pain, abdominal pain, nausea/vomiting, fever/chills.  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 MRI cervical spine showed evidence of acute cervical spine process MRI brain performed on 08/23/2024 shows no evidence of acute ischemic infarct Neurology on board, s/p LP under fluoroscopy by IR on 12/2 showed albumin no cytologic dissociation consistent with GBS Started patient on IVIG on 12/2, x 5 doses, completed on 12/6 Continue gabapentin , topical lidocaine  Closely monitor serial NIF/VC q12h with RT consult PT/OT recommending SNF  Acute hypoxic respiratory failure likely 2/2 left lower lobe pneumonia Currently requiring about 4 L of O2 Currently afebrile, with no leukocytosis BNP WNL, D-dimer elevated BC x 2 pending Chest x-ray with left lower lobe consolidation, right lower lobe segmental  atelectasis, cardiomegaly CTA chest negative for PE, noted left lower lobe consolidation consistent with pneumonia COVID, flu, RSV, respiratory viral panel pending Urine strep pneumo, Legionella pending Continue IV Zosyn , DuoNebs, nebulizers/Mucomyst  Incentive spirometry/flutter valve Supplemental O2 as needed Monitor closely on progressive floor  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  Noted bilateral nonobstructing nephrolithiasis Outpatient follow up  Essential Hypertension BP stable Hydralazine  IV as needed  Chronic Tobacco Abuse Smokes a pack per day for nearly 40 years Advised to quit  C/w Nicotine  14 mg TD Patch   Hypoalbuminemia Follow-up   Class I Obesity Lifestyle modification advised    DVT prophylaxis: enoxaparin  (LOVENOX ) injection 40 mg Start: 08/29/24 1045 SCDs Start: 08/28/24 0008    Code Status: Full Code Family Communication: None at bedside  Disposition Plan:  Level of care: Progressive Status is: Inpatient Remains inpatient appropriate because: Level of care  Consultants:  Neurology  Procedures:  LP on 12/2  Antimicrobials:  Anti-infectives (From admission, onward)    Start     Dose/Rate Route Frequency Ordered Stop   09/02/24 2200  piperacillin -tazobactam (ZOSYN ) IVPB 3.375 g       Placed in Followed by Linked Group   3.375 g 12.5 mL/hr over 240 Minutes  Intravenous Every 8 hours 09/02/24 1452  09/02/24 1545  piperacillin -tazobactam (ZOSYN ) IVPB 3.375 g       Placed in Followed by Linked Group   3.375 g 100 mL/hr over 30 Minutes Intravenous  Once 09/02/24 1452 09/03/24 0359        Objective: Vitals:   09/03/24 0448 09/03/24 0809 09/03/24 0945 09/03/24 1100  BP: (!) 145/78 (!) 145/82 (!) 145/73 137/83  Pulse: (!) 105 (!) 104  (!) 112  Resp: 20 18  (!) 21  Temp: 98.6 F (37 C) 98.7 F (37.1 C)  98.7 F (37.1 C)  TempSrc: Oral Oral  Oral  SpO2: 97% 96%  96%  Weight: 85.6 kg     Height:        Intake/Output Summary (Last 24 hours) at 09/03/2024 1146 Last data filed at 09/03/2024 0900 Gross per 24 hour  Intake 819.68 ml  Output 1050 ml  Net -230.32 ml   Filed Weights   09/01/24 0607 09/02/24 0321 09/03/24 0448  Weight: 86.3 kg 85.2 kg 85.6 kg   Examination: Physical Exam: General: NAD, deconditioned, lethargic Cardiovascular: S1, S2 present Respiratory: Diminished breath sounds bilaterally, no accessory muscle use Abdomen: Soft, nontender, nondistended, bowel sounds present Musculoskeletal: No bilateral pedal edema noted Skin: Normal Psychiatry: Fair mood  Neurology: Noted extremity weakness/numbness, otherwise no obvious neurologic deficits noted    Data Reviewed: I have personally reviewed following labs and imaging studies  CBC: Recent Labs  Lab 08/28/24 0117 08/29/24 0523 08/30/24 0327 09/02/24 1341 09/03/24 1011  WBC 7.1 4.2 5.4 10.3 10.0  NEUTROABS 5.3 2.8 3.5  --  8.0*  HGB 13.4 12.1 12.4 12.0 11.9*  HCT 40.0 35.5* 35.8* 35.7* 36.0  MCV 102.0* 98.9 99.2 101.1* 100.8*  PLT 196 187 186 178 197   Basic Metabolic Panel: Recent Labs  Lab 08/29/24 0523 08/30/24 0327 08/31/24 0234 09/01/24 0205 09/02/24 0327 09/03/24 0330 09/03/24 1011  NA 136 136 138  --  136  --  133*  K 4.4 4.2 4.2  --  4.0  --  3.6  CL 96* 98 99  --  96*  --  95*  CO2 29 27 33*  --  28  --  28  GLUCOSE 104* 96 102*  --   111*  --  123*  BUN <5* 5* 5*  --  <5*  --  <5*  CREATININE 0.50 0.54 0.56  --  0.52  --  0.55  CALCIUM  8.4* 8.5* 8.4*  --  9.0  --  8.4*  MG 1.6* 1.7 1.6* 1.9 1.6* 1.5*  --   PHOS 2.9  --   --   --   --   --   --    GFR: Estimated Creatinine Clearance: 76.6 mL/min (by C-G formula based on SCr of 0.55 mg/dL). Liver Function Tests: Recent Labs  Lab 08/27/24 1854 08/28/24 0117 08/29/24 0523 08/30/24 0327 08/31/24 0234  AST 81* 68* 55* 55* 65*  ALT 23 20 17 18 17   ALKPHOS 95 88 71 70 82  BILITOT 0.9 0.9 0.8 0.7 0.5  PROT 6.8 5.8* 5.8* 6.5 6.9  ALBUMIN 2.8* 2.5* 2.2* 2.2* 2.0*   No results for input(s): LIPASE, AMYLASE in the last 168 hours.  No results for input(s): AMMONIA in the last 168 hours. Coagulation Profile: No results for input(s): INR, PROTIME in the last 168 hours. Cardiac Enzymes: No results for input(s): CKTOTAL, CKMB, CKMBINDEX, TROPONINI in the last 168 hours. BNP (last 3 results) No results for input(s): PROBNP in the last 8760 hours.  HbA1C: No results for input(s): HGBA1C in the last 72 hours. CBG: No results for input(s): GLUCAP in the last 168 hours. Lipid Profile: No results for input(s): CHOL, HDL, LDLCALC, TRIG, CHOLHDL, LDLDIRECT in the last 72 hours. Thyroid  Function Tests: No results for input(s): TSH, T4TOTAL, FREET4, T3FREE, THYROIDAB in the last 72 hours. Anemia Panel: No results for input(s): VITAMINB12, FOLATE, FERRITIN, TIBC, IRON, RETICCTPCT in the last 72 hours. Sepsis Labs: No results for input(s): PROCALCITON, LATICACIDVEN in the last 168 hours.  Recent Results (from the past 240 hours)  MRSA Next Gen by PCR, Nasal     Status: None   Collection Time: 08/28/24  3:08 AM   Specimen: Nasal Mucosa; Nasal Swab  Result Value Ref Range Status   MRSA by PCR Next Gen NOT DETECTED NOT DETECTED Final    Comment: (NOTE) The GeneXpert MRSA Assay (FDA approved for NASAL specimens  only), is one component of a comprehensive MRSA colonization surveillance program. It is not intended to diagnose MRSA infection nor to guide or monitor treatment for MRSA infections. Test performance is not FDA approved in patients less than 69 years old. Performed at [facility], [address] [place], [place] [address]   CSF culture w Gram Stain     Status: None   Collection Time: 08/28/24 10:44 AM   Specimen: PATH Cytology CSF; Cerebrospinal Fluid  Result Value Ref Range Status   Specimen Description CSF  Final   Special Requests NONE  Final   Gram Stain NO WBC SEEN NO ORGANISMS SEEN CYTOSPIN SMEAR   Final   Culture   Final    NO GROWTH 3 DAYS Performed at [facility], [address] [place], [place] [address]    Report Status 08/31/2024 FINAL  Final  Culture, blood (Routine X 2) w Reflex to ID Panel     Status: None (Preliminary result)   Collection Time: 09/02/24  7:01 PM   Specimen: BLOOD LEFT HAND  Result Value Ref Range Status   Specimen Description BLOOD LEFT HAND  Final   Special Requests   Final    BOTTLES DRAWN AEROBIC AND ANAEROBIC Blood Culture adequate volume   Culture   Final    NO GROWTH < 12 HOURS Performed at [facility], [address] [place], [place] [address]    Report Status PENDING  Incomplete  Culture, blood (Routine X 2) w Reflex to ID Panel     Status: None (Preliminary result)   Collection Time: 09/02/24  7:07 PM   Specimen: BLOOD RIGHT HAND  Result Value Ref Range Status   Specimen Description BLOOD RIGHT HAND  Final   Special Requests   Final    BOTTLES DRAWN AEROBIC AND ANAEROBIC Blood Culture results may not be optimal due to an inadequate volume of blood received in culture bottles   Culture   Final    NO GROWTH < 12 HOURS Performed at [facility], [address] [place], [place] [address]    Report Status PENDING  Incomplete    Radiology Studies: CT Angio Chest Pulmonary Embolism (PE) W or WO  Contrast Result Date: 09/02/2024 EXAM: CTA of the Chest with contrast for PE 09/02/2024 04:58:26 PM TECHNIQUE: CTA of the chest was performed without and with the administration of 75 mL of iohexol  (OMNIPAQUE ) 350 MG/ML injection. Multiplanar reformatted images are provided for review. MIP images are provided for review. Automated exposure control, iterative reconstruction, and/or weight based adjustment of  the mA/kV was utilized to reduce the radiation dose to as low as reasonably achievable. COMPARISON: Chest x-ray today. CLINICAL HISTORY: Pulmonary embolism (PE) suspected, low to intermediate prob, positive D-dimer; CXR with pneumonia. FINDINGS: PULMONARY ARTERIES: Pulmonary arteries are adequately opacified for evaluation. No pulmonary embolism. Main pulmonary artery is normal in caliber. MEDIASTINUM: The heart and pericardium demonstrate no acute abnormality. Aortic atherosclerosis. There is no acute abnormality of the thoracic aorta. LYMPH NODES: No mediastinal, hilar or axillary lymphadenopathy. LUNGS AND PLEURA: Consolidation in the left lower lobe compatible with pneumonia. Plate-like atelectasis in the right lower lobe. No pleural effusion or pneumothorax. UPPER ABDOMEN: Stranding and fluid collections again noted around the pancreatic body and tail, similar to prior abdominal CT from 08/29/2024. SOFT TISSUES AND BONES: No acute bone or soft tissue abnormality. IMPRESSION: 1. No pulmonary embolism. 2. Left lower lobe consolidation consistent with pneumonia. 3. Pancreatitis changes stable since prior abdominal CT. Electronically signed by: Franky Crease MD 09/02/2024 05:09 PM EST RP Workstation: [id number]   DG CHEST PORT 1 VIEW Result Date: 09/02/2024 EXAM: 1 VIEW(S) XRAY OF THE CHEST 09/02/2024 12:26:00 PM COMPARISON: 06/26/2014 . CLINICAL HISTORY: Tachycardia FINDINGS: LUNGS AND PLEURA: Left lower lobe consolidation. Right lower lung subsegmental atelectasis. Possible small left pleural effusion. No  pneumothorax. HEART AND MEDIASTINUM: Cardiomegaly. BONES AND SOFT TISSUES: No acute osseous abnormality. IMPRESSION: 1. Left lower lobe consolidation with possible small left pleural effusion. 2. Cardiomegaly. 3. Right lower lung subsegmental atelectasis. Electronically signed by: Camellia Candle MD 09/02/2024 12:47 PM EST RP Workstation: [id number]    Scheduled Meds:  acetylcysteine   2 mL Nebulization TID   amLODipine   5 mg Oral Daily   arformoterol   15 mcg Nebulization BID   budesonide  (PULMICORT ) nebulizer solution  0.25 mg Nebulization BID   dextromethorphan -guaiFENesin   1 tablet Oral BID   enoxaparin  (LOVENOX ) injection  40 mg Subcutaneous Q24H   feeding supplement  237 mL Oral BID BM   folic acid   1 mg Oral Daily   gabapentin   400 mg Oral TID   And   gabapentin   400 mg Oral QHS   ipratropium  0.5 mg Nebulization BID   lidocaine    Topical BID   polyethylene glycol  17 g Oral BID   senna-docusate  1 tablet Oral BID   [START ON 09/05/2024] thiamine  (VITAMIN B1) injection  100 mg Intravenous Daily   Continuous Infusions:  magnesium  sulfate bolus IVPB 4 g (09/03/24 0953)   piperacillin -tazobactam (ZOSYN )  IV 3.375 g (09/03/24 0528)   thiamine  (VITAMIN B1) injection 250 mg (09/02/24 0826)    LOS: 6 days   Deanna JINNY Cage, MD [facility] Available via Epic secure chat 7am-7pm After these hours, please refer to coverage provider listed on [email or web address] 09/03/2024, 11:46 AM

## 2024-09-03 NOTE — Progress Notes (Signed)
 Occupational Therapy Treatment Patient Details Name: Deanna Goodwin MRN: [id number] DOB: [birth year] Today's Date: 09/03/2024   History of present illness Pt is a 63 y.o. female presenting 12/1 with weakness. Found to have hypokalemia, hypomagnesia.     PMH: HTN, neuropathy.   OT comments  Patient received in supine and agreeable to OT/PT session. Patient requiring assist of 2 to get to EOB due to assistance with trunk and BLEs and demonstrated right lateral leaning while on EOB. Patient able to stand into Stedy with mod assist +2 with use of bed pads and performed 3 other stands from recliner and stedy pads with min assist +2.  Patient will benefit from continued inpatient follow up therapy, <3 hours/day.  Acute OT to continue to follow to address established goals to facilitate DC to next venue of care.        If plan is discharge home, recommend the following:  Two people to help with walking and/or transfers;A lot of help with bathing/dressing/bathroom;Assistance with cooking/housework;Assist for transportation;Help with stairs or ramp for entrance   Equipment Recommendations  Other (comment) (defer)    Recommendations for Other Services      Precautions / Restrictions Precautions Precautions: Fall Restrictions Weight Bearing Restrictions Per Provider Order: No       Mobility Bed Mobility Overal bed mobility: Needs Assistance Bed Mobility: Rolling, Sidelying to Sit Rolling: Min assist, Used rails Sidelying to sit: Mod assist, +2 for physical assistance       General bed mobility comments: requiring assistance with BLEs and trunk    Transfers Overall transfer level: Needs assistance Equipment used: Ambulation equipment used Transfers: Sit to/from Stand, Bed to chair/wheelchair/BSC Sit to Stand: Mod assist, +2 physical assistance           General transfer comment: mod assist +2 for sit to stand from EOB with use of bed pad with patient progressing to min assist +2  from [employer] pads and recliner Transfer via Lift Equipment: Stedy   Balance Overall balance assessment: Needs assistance Sitting-balance support: Single extremity supported, Feet supported Sitting balance-Leahy Scale: Poor Sitting balance - Comments: right lateral leaning   Standing balance support: Bilateral upper extremity supported, Reliant on assistive device for balance Standing balance-Leahy Scale: Poor Standing balance comment: reliant on Stedy for support                           ADL either performed or assessed with clinical judgement   ADL Overall ADL's : Needs assistance/impaired     Grooming: Wash/dry hands;Wash/dry face;Brushing hair;Moderate assistance;Sitting Grooming Details (indicate cue type and reason): assistance with brushing hair         Upper Body Dressing : Minimal assistance;Sitting Upper Body Dressing Details (indicate cue type and reason): gown change Lower Body Dressing: Maximal assistance;Bed level Lower Body Dressing Details (indicate cue type and reason): donned socks                    Extremity/Trunk Assessment              Vision       Perception     Praxis     Communication Communication Communication: Impaired Factors Affecting Communication: Reduced clarity of speech   Cognition Arousal: Alert Behavior During Therapy: WFL for tasks assessed/performed Cognition: Cognition impaired   Orientation impairments: Time   Memory impairment (select all impairments): Short-term memory   Executive functioning impairment (select all impairments): Problem solving  Following commands: Intact        Cueing   Cueing Techniques: Verbal cues, Gestural cues  Exercises      Shoulder Instructions       General Comments      Pertinent Vitals/ Pain       Pain Assessment Pain Assessment: Faces Faces Pain Scale: Hurts even more Pain Location: Bil feet and hands Pain Descriptors /  Indicators: Pins and needles, Sore, Burning Pain Intervention(s): Limited activity within patient's tolerance, Monitored during session, Repositioned  Home Living                                          Prior Functioning/Environment              Frequency  Min 2X/week        Progress Toward Goals  OT Goals(current goals can now be found in the care plan section)  Progress towards OT goals: Progressing toward goals  Acute Rehab OT Goals Patient Stated Goal: to get better OT Goal Formulation: With patient Time For Goal Achievement: 09/11/24 Potential to Achieve Goals: Good ADL Goals Pt Will Perform Grooming: with set-up;sitting Pt Will Perform Upper Body Dressing: with set-up;sitting Pt Will Transfer to Toilet: with min assist;stand pivot transfer Pt/caregiver will Perform Home Exercise Program: Increased strength;Both right and left upper extremity;With written HEP provided  Plan      Co-evaluation    PT/OT/SLP Co-Evaluation/Treatment: Yes Reason for Co-Treatment: For patient/therapist safety;To address functional/ADL transfers   OT goals addressed during session: ADL's and self-care      AM-PAC OT 6 Clicks Daily Activity     Outcome Measure   Help from another person eating meals?: A Little Help from another person taking care of personal grooming?: A Little Help from another person toileting, which includes using toliet, bedpan, or urinal?: Total Help from another person bathing (including washing, rinsing, drying)?: A Lot Help from another person to put on and taking off regular upper body clothing?: A Little Help from another person to put on and taking off regular lower body clothing?: Total 6 Click Score: 13    End of Session Equipment Utilized During Treatment: Gait belt;Other (comment) Laurent)  OT Visit Diagnosis: Unsteadiness on feet (R26.81);Muscle weakness (generalized) (M62.81);Other symptoms and signs involving cognitive  function   Activity Tolerance Patient tolerated treatment well   Patient Left in chair;with call bell/phone within reach;with chair alarm set   Nurse Communication Mobility status;Need for lift equipment        Time: 9151-9082 OT Time Calculation (min): 29 min  Charges: OT General Charges $OT Visit: 1 Visit OT Treatments $Self Care/Home Management : 8-22 mins  Dick Laine, OTA Acute Rehabilitation Services  Office [phone]   Jeb LITTIE Laine 09/03/2024, 9:36 AM

## 2024-09-03 NOTE — Plan of Care (Signed)

## 2024-09-03 NOTE — TOC Progression Note (Signed)
 Transition of Care [facility]) - Progression Note    Patient Details  Name: Deanna Goodwin MRN: [id number] Date of Birth: 1960-10-03  [facility] CM/SW Contact  Lauraine FORBES Saa, LCSWA Phone Number: 09/03/2024, 11:40 AM  Clinical Narrative:     11:40 AM [facility] informed CSW that they submitted SNF insurance authorization request today ([id number]) which is currently pending. CSW notified of patient's interest in disability and FMLA. CSW consulted financial counseling and MD for further assistance. CSW will continue to follow.  Expected Discharge Plan: Skilled Nursing Facility Barriers to Discharge: Continued Medical Work up, English As A Second Language Teacher, SNF Pending bed offer               Expected Discharge Plan and Services In-house Referral: Clinical Social Work Discharge Planning Services: [employer] Consult Post Acute Care Choice: Skilled Nursing Facility Living arrangements for the past 2 months: Single Family Home                                       Social Drivers of Health (SDOH) Interventions SDOH Screenings   Food Insecurity: No Food Insecurity (08/28/2024)  Housing: High Risk (08/28/2024)  Transportation Needs: No Transportation Needs (08/28/2024)  Utilities: At Risk (08/28/2024)  Alcohol Screen: Low Risk  (09/22/2023)  Depression (PHQ2-9): High Risk (09/22/2023)  Financial Resource Strain: Medium Risk (09/22/2023)  Physical Activity: Inactive (09/22/2023)  Social Connections: Moderately Integrated (09/22/2023)  Stress: No Stress Concern Present (09/22/2023)  Tobacco Use: High Risk (08/28/2024)  Health Literacy: Adequate Health Literacy (09/22/2023)    Readmission Risk Interventions     No data to display

## 2024-09-03 NOTE — Plan of Care (Signed)

## 2024-09-04 LAB — CBC WITH DIFFERENTIAL/PLATELET
Abs Immature Granulocytes: 0.04 K/uL (ref 0.00–0.07)
Basophils Absolute: 0 K/uL (ref 0.0–0.1)
Basophils Relative: 0 %
Eosinophils Absolute: 0.1 K/uL (ref 0.0–0.5)
Eosinophils Relative: 1 %
HCT: 32.9 % — ABNORMAL LOW (ref 36.0–46.0)
Hemoglobin: 10.9 g/dL — ABNORMAL LOW (ref 12.0–15.0)
Immature Granulocytes: 0 %
Lymphocytes Relative: 11 %
Lymphs Abs: 1 K/uL (ref 0.7–4.0)
MCH: 33.5 pg (ref 26.0–34.0)
MCHC: 33.1 g/dL (ref 30.0–36.0)
MCV: 101.2 fL — ABNORMAL HIGH (ref 80.0–100.0)
Monocytes Absolute: 0.7 K/uL (ref 0.1–1.0)
Monocytes Relative: 8 %
Neutro Abs: 7.5 K/uL (ref 1.7–7.7)
Neutrophils Relative %: 80 %
Platelets: 206 K/uL (ref 150–400)
RBC: 3.25 MIL/uL — ABNORMAL LOW (ref 3.87–5.11)
RDW: 13.3 % (ref 11.5–15.5)
WBC: 9.4 K/uL (ref 4.0–10.5)
nRBC: 0 % (ref 0.0–0.2)

## 2024-09-04 LAB — BASIC METABOLIC PANEL WITH GFR
Anion gap: 10 (ref 5–15)
BUN: <5 mg/dL — ABNORMAL LOW (ref 8–23)
CO2: 27 mmol/L (ref 22–32)
Calcium: 8.4 mg/dL — ABNORMAL LOW (ref 8.9–10.3)
Chloride: 99 mmol/L (ref 98–111)
Creatinine, Ser: 0.54 mg/dL (ref 0.44–1.00)
GFR, Estimated: >60 mL/min (ref >60)
Glucose, Bld: 108 mg/dL — ABNORMAL HIGH (ref 70–99)
Potassium: 3.7 mmol/L (ref 3.5–5.1)
Sodium: 136 mmol/L (ref 135–145)

## 2024-09-04 LAB — MAGNESIUM: Magnesium: 1.8 mg/dL (ref 1.7–2.4)

## 2024-09-04 NOTE — Progress Notes (Signed)
 NIF = -30 cmH2O VC = 1.3  Pt performed with good effort and understanding.

## 2024-09-04 NOTE — Plan of Care (Signed)

## 2024-09-04 NOTE — Progress Notes (Signed)
 PROGRESS NOTE    Deanna Goodwin  FMW:[id number] DOB: 16-Jul-1961 DOA: 08/27/2024 PCP: Tanda Bleacher, MD    Brief Narrative:  63 year old female with a past medical history significant for but not limited to hypertension history of seizures, and history of nephrolithiasis status post ureteroscopy and stent placement with lithotripsy who presents with a 1 week history of ascending weakness involving all 4 extremities with primarily in bilateral upper extremities. Pt was recently seen in the ED on 11/27 with similar symptoms and brain MRI done showed no acute process and no evidence of infarct. Given her continued worsening and generalized weakness she presented again and neurology was consulted and recommended MRI C-spine which was negative.  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  Neurology on board, started patient on IVIG.    Today, patient still sounding congested, although noted to wean off O2, slowly improving.  Complaining of neuropathic pain in both hands and feet.  Plan for another day or 2 of DuoNebs/Mucomyst  and IV Zosyn  given recent diagnosis of Guillain-Barr.     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 MRI cervical spine showed evidence of acute cervical spine process MRI brain performed on 08/23/2024 shows no evidence of acute ischemic infarct Neurology on board, s/p LP under fluoroscopy by IR on 12/2 showed albuminocytologic dissociation consistent with GBS Started patient on IVIG on 12/2, x 5 doses, completed on 12/6 Continue gabapentin , topical lidocaine  Closely monitor serial NIF/VC q12h with RT consult PT/OT recommending SNF  Acute hypoxic respiratory failure likely 2/2 left lower lobe pneumonia Initially requiring about 4 L of O2, currently weaned off Currently afebrile, with no leukocytosis BNP WNL, D-dimer elevated BC x 2 no growth till date Chest x-ray with left lower lobe consolidation, right lower lobe  segmental atelectasis, cardiomegaly CTA chest negative for PE, noted left lower lobe consolidation consistent with pneumonia COVID, flu, RSV, respiratory viral panel all negative Urine strep pneumo, Legionella pending Continue IV Zosyn , DuoNebs, nebulizers/Mucomyst  Incentive spirometry/flutter valve Supplemental O2 as needed  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  Noted bilateral nonobstructing nephrolithiasis Outpatient follow up  Essential Hypertension BP stable Hydralazine  IV as needed  Chronic Tobacco Abuse Smokes a pack per day for nearly 40 years Advised to quit  C/w Nicotine  14 mg TD Patch   Hypoalbuminemia Follow-up   Class I Obesity Lifestyle modification advised    DVT prophylaxis: enoxaparin  (LOVENOX ) injection 40 mg Start: 08/29/24 1045 SCDs Start: 08/28/24 0008    Code Status: Full Code Family Communication: None at bedside  Disposition Plan:  Level of care: Progressive Status is: Inpatient Remains inpatient appropriate because: Level of care  Consultants:  Neurology  Procedures:  LP on 12/2  Antimicrobials:  Anti-infectives (From admission, onward)    Start     Dose/Rate Route Frequency Ordered Stop   09/02/24 2200  piperacillin -tazobactam (ZOSYN ) IVPB 3.375 g       Placed in Followed by Linked Group   3.375 g 12.5 mL/hr over 240 Minutes Intravenous Every 8 hours  09/02/24 1452  09/02/24 1545  piperacillin -tazobactam (ZOSYN ) IVPB 3.375 g       Placed in Followed by Linked Group   3.375 g 100 mL/hr over 30 Minutes Intravenous  Once 09/02/24 1452 09/03/24 0359        Objective: Vitals:   09/04/24 0225 09/04/24 0525 09/04/24 0753 09/04/24 0928  BP: 136/68  133/75 139/79  Pulse: (!) 104  (!) 109   Resp: 17  18   Temp: 98.7 F (37.1 C)  98.9 F (37.2 C)   TempSrc: Oral  Oral   SpO2: 97%  98%   Weight:  84.6 kg    Height:        Intake/Output Summary (Last 24 hours) at 09/04/2024 1129 Last data filed at 09/04/2024 0200 Gross per 24 hour  Intake 129.8 ml  Output 650 ml  Net -520.2 ml   Filed Weights   09/02/24 0321 09/03/24 0448 09/04/24 0525  Weight: 85.2 kg 85.6 kg 84.6 kg   Examination: Physical Exam: General: NAD, deconditioned Cardiovascular: S1, S2 present Respiratory: Diminished breath sounds bilaterally, no accessory muscle use Abdomen: Soft, nontender, nondistended, bowel sounds present Musculoskeletal: No bilateral pedal edema noted Skin: Normal Psychiatry: Fair mood  Neurology: Noted extremity weakness/numbness, otherwise no obvious neurologic deficits noted    Data Reviewed: I have personally reviewed following labs and imaging studies  CBC: Recent Labs  Lab 08/29/24 0523 08/30/24 0327 09/02/24 1341 09/03/24 1011 09/04/24 0255  WBC 4.2 5.4 10.3 10.0 9.4  NEUTROABS 2.8 3.5  --  8.0* 7.5  HGB 12.1 12.4 12.0 11.9* 10.9*  HCT 35.5* 35.8* 35.7* 36.0 32.9*  MCV 98.9 99.2 101.1* 100.8* 101.2*  PLT 187 186 178 197 206   Basic Metabolic Panel: Recent Labs  Lab 08/29/24 0523 08/30/24 0327 08/31/24 0234 09/01/24 0205 09/02/24 0327 09/03/24 0330 09/03/24 1011 09/04/24 0255  NA 136 136 138  --  136  --  133* 136  K 4.4 4.2 4.2  --  4.0  --  3.6 3.7  CL 96* 98 99  --  96*  --  95* 99  CO2 29 27 33*  --  28  --  28 27  GLUCOSE 104* 96 102*  --  111*  --  123* 108*  BUN <5* 5* 5*  --  <5*  --  <5* <5*   CREATININE 0.50 0.54 0.56  --  0.52  --  0.55 0.54  CALCIUM  8.4* 8.5* 8.4*  --  9.0  --  8.4* 8.4*  MG 1.6* 1.7 1.6* 1.9 1.6* 1.5*  --  1.8  PHOS 2.9  --   --   --   --   --   --   --    GFR: Estimated Creatinine Clearance: 76.1 mL/min (by C-G formula based on SCr of 0.54 mg/dL). Liver Function Tests: Recent Labs  Lab 08/29/24 0523 08/30/24 0327 08/31/24 0234  AST 55* 55* 65*  ALT 17 18 17   ALKPHOS 71 70 82  BILITOT 0.8 0.7 0.5  PROT 5.8* 6.5 6.9  ALBUMIN 2.2* 2.2* 2.0*   No results for input(s): LIPASE, AMYLASE in the last 168 hours.  No results for input(s): AMMONIA in the last 168 hours. Coagulation Profile: No results for input(s): INR, PROTIME in the last 168 hours. Cardiac Enzymes: No results for input(s): CKTOTAL, CKMB, CKMBINDEX, TROPONINI in the last 168 hours. BNP (last 3 results) No results for input(s): PROBNP in the last 8760 hours. HbA1C: No results for input(s): HGBA1C in the last 72 hours.  CBG: No results for input(s): GLUCAP in the last 168 hours. Lipid Profile: No results for input(s): CHOL, HDL, LDLCALC, TRIG, CHOLHDL, LDLDIRECT in the last 72 hours. Thyroid  Function Tests: No results for input(s): TSH, T4TOTAL, FREET4, T3FREE, THYROIDAB in the last 72 hours. Anemia Panel: No results for input(s): VITAMINB12, FOLATE, FERRITIN, TIBC, IRON, RETICCTPCT in the last 72 hours. Sepsis Labs: No results for input(s): PROCALCITON, LATICACIDVEN in the last 168 hours.  Recent Results (from the past 240 hours)  MRSA Next Gen by PCR, Nasal     Status: None   Collection Time: 08/28/24  3:08 AM   Specimen: Nasal Mucosa; Nasal Swab  Result Value Ref Range Status   MRSA by PCR Next Gen NOT DETECTED NOT DETECTED Final    Comment: (NOTE) The GeneXpert MRSA Assay (FDA approved for NASAL specimens only), is one component of a comprehensive MRSA colonization surveillance program. It is not intended to  diagnose MRSA infection nor to guide or monitor treatment for MRSA infections. Test performance is not FDA approved in patients less than 30 years old. Performed at [facility], [address] [place], [place] [address]   CSF culture w Gram Stain     Status: None   Collection Time: 08/28/24 10:44 AM   Specimen: PATH Cytology CSF; Cerebrospinal Fluid  Result Value Ref Range Status   Specimen Description CSF  Final   Special Requests NONE  Final   Gram Stain NO WBC SEEN NO ORGANISMS SEEN CYTOSPIN SMEAR   Final   Culture   Final    NO GROWTH 3 DAYS Performed at [facility], [address], [place], [place] [address]    Report Status 08/31/2024 FINAL  Final  Culture, blood (Routine X 2) w Reflex to ID Panel     Status: None (Preliminary result)   Collection Time: 09/02/24  7:01 PM   Specimen: BLOOD LEFT HAND  Result Value Ref Range Status   Specimen Description BLOOD LEFT HAND  Final   Special Requests   Final    BOTTLES DRAWN AEROBIC AND ANAEROBIC Blood Culture adequate volume   Culture   Final    NO GROWTH 2 DAYS Performed at [facility], [address] [place], [place] [address]    Report Status PENDING  Incomplete  Culture, blood (Routine X 2) w Reflex to ID Panel     Status: None (Preliminary result)   Collection Time: 09/02/24  7:07 PM   Specimen: BLOOD RIGHT HAND  Result Value Ref Range Status   Specimen Description BLOOD RIGHT HAND  Final   Special Requests   Final    BOTTLES DRAWN AEROBIC AND ANAEROBIC Blood Culture results may not be optimal due to an inadequate volume of blood received in culture bottles   Culture   Final    NO GROWTH 2 DAYS Performed at [facility], [address], [place], [place] [address]    Report Status PENDING  Incomplete  Resp panel by RT-PCR (RSV, Flu A&B, Covid) Anterior Nasal Swab     Status: None   Collection Time: 09/03/24 11:43 AM   Specimen: Anterior Nasal Swab  Result Value Ref Range Status   SARS  Coronavirus 2 by RT PCR NEGATIVE NEGATIVE Final   Influenza A by PCR NEGATIVE NEGATIVE Final   Influenza B by PCR NEGATIVE NEGATIVE Final    Comment: (NOTE) The Xpert Xpress SARS-CoV-2/FLU/RSV plus assay is intended as an aid in the diagnosis of influenza  from Nasopharyngeal swab specimens and should not be used as a sole basis for treatment. Nasal washings and aspirates are unacceptable for Xpert Xpress SARS-CoV-2/FLU/RSV testing.  Fact Sheet for Patients: bloggercourse.com  Fact Sheet for Healthcare Providers: seriousbroker.it  This test is not yet approved or cleared by the [place]  FDA and has been authorized for detection and/or diagnosis of SARS-CoV-2 by FDA under an Emergency Use Authorization (EUA). This EUA will remain in effect (meaning this test can be used) for the duration of the COVID-19 declaration under Section 564(b)(1) of the Act, 21 U.S.C. section 360bbb-3(b)(1), unless the authorization is terminated or revoked.     Resp Syncytial Virus by PCR NEGATIVE NEGATIVE Final    Comment: (NOTE) Fact Sheet for Patients: bloggercourse.com  Fact Sheet for Healthcare Providers: seriousbroker.it  This test is not yet approved or cleared by the [place]  FDA and has been authorized for detection and/or diagnosis of SARS-CoV-2 by FDA under an Emergency Use Authorization (EUA). This EUA will remain in effect (meaning this test can be used) for the duration of the COVID-19 declaration under Section 564(b)(1) of the Act, 21 U.S.C. section 360bbb-3(b)(1), unless the authorization is terminated or revoked.  Performed at [facility], [address] [place], [place] [address]   Respiratory (~20 pathogens) panel by PCR     Status: None   Collection Time: 09/03/24 11:43 AM   Specimen: Nasopharyngeal Swab; Respiratory  Result Value Ref Range Status   Adenovirus  NOT DETECTED NOT DETECTED Final   Coronavirus 229E NOT DETECTED NOT DETECTED Final    Comment: (NOTE) The Coronavirus on the Respiratory Panel, DOES NOT test for the novel  Coronavirus (2019 nCoV)    Coronavirus HKU1 NOT DETECTED NOT DETECTED Final   Coronavirus NL63 NOT DETECTED NOT DETECTED Final   Coronavirus OC43 NOT DETECTED NOT DETECTED Final   Metapneumovirus NOT DETECTED NOT DETECTED Final   Rhinovirus / Enterovirus NOT DETECTED NOT DETECTED Final   Influenza A NOT DETECTED NOT DETECTED Final   Influenza B NOT DETECTED NOT DETECTED Final   Parainfluenza Virus 1 NOT DETECTED NOT DETECTED Final   Parainfluenza Virus 2 NOT DETECTED NOT DETECTED Final   Parainfluenza Virus 3 NOT DETECTED NOT DETECTED Final   Parainfluenza Virus 4 NOT DETECTED NOT DETECTED Final   Respiratory Syncytial Virus NOT DETECTED NOT DETECTED Final   Bordetella pertussis NOT DETECTED NOT DETECTED Final   Bordetella Parapertussis NOT DETECTED NOT DETECTED Final   Chlamydophila pneumoniae NOT DETECTED NOT DETECTED Final   Mycoplasma pneumoniae NOT DETECTED NOT DETECTED Final    Comment: Performed at [facility], [address], [place], [place] [address]    Radiology Studies: CT Angio Chest Pulmonary Embolism (PE) W or WO Contrast Result Date: 09/02/2024 EXAM: CTA of the Chest with contrast for PE 09/02/2024 04:58:26 PM TECHNIQUE: CTA of the chest was performed without and with the administration of 75 mL of iohexol  (OMNIPAQUE ) 350 MG/ML injection. Multiplanar reformatted images are provided for review. MIP images are provided for review. Automated exposure control, iterative reconstruction, and/or weight based adjustment of the mA/kV was utilized to reduce the radiation dose to as low as reasonably achievable. COMPARISON: Chest x-ray today. CLINICAL HISTORY: Pulmonary embolism (PE) suspected, low to intermediate prob, positive D-dimer; CXR with pneumonia. FINDINGS: PULMONARY ARTERIES: Pulmonary arteries are  adequately opacified for evaluation. No pulmonary embolism. Main pulmonary artery is normal in caliber. MEDIASTINUM: The heart and pericardium demonstrate no acute abnormality. Aortic atherosclerosis. There is no  acute abnormality of the thoracic aorta. LYMPH NODES: No mediastinal, hilar or axillary lymphadenopathy. LUNGS AND PLEURA: Consolidation in the left lower lobe compatible with pneumonia. Plate-like atelectasis in the right lower lobe. No pleural effusion or pneumothorax. UPPER ABDOMEN: Stranding and fluid collections again noted around the pancreatic body and tail, similar to prior abdominal CT from 08/29/2024. SOFT TISSUES AND BONES: No acute bone or soft tissue abnormality. IMPRESSION: 1. No pulmonary embolism. 2. Left lower lobe consolidation consistent with pneumonia. 3. Pancreatitis changes stable since prior abdominal CT. Electronically signed by: Franky Crease MD 09/02/2024 05:09 PM EST RP Workstation: [id number]   DG CHEST PORT 1 VIEW Result Date: 09/02/2024 EXAM: 1 VIEW(S) XRAY OF THE CHEST 09/02/2024 12:26:00 PM COMPARISON: 06/26/2014 . CLINICAL HISTORY: Tachycardia FINDINGS: LUNGS AND PLEURA: Left lower lobe consolidation. Right lower lung subsegmental atelectasis. Possible small left pleural effusion. No pneumothorax. HEART AND MEDIASTINUM: Cardiomegaly. BONES AND SOFT TISSUES: No acute osseous abnormality. IMPRESSION: 1. Left lower lobe consolidation with possible small left pleural effusion. 2. Cardiomegaly. 3. Right lower lung subsegmental atelectasis. Electronically signed by: Camellia Candle MD 09/02/2024 12:47 PM EST RP Workstation: [id number]    Scheduled Meds:  acetylcysteine   2 mL Nebulization TID   amLODipine   5 mg Oral Daily   arformoterol   15 mcg Nebulization BID   budesonide  (PULMICORT ) nebulizer solution  0.25 mg Nebulization BID   dextromethorphan -guaiFENesin   1 tablet Oral BID   enoxaparin  (LOVENOX ) injection  40 mg Subcutaneous Q24H   feeding supplement  237 mL Oral BID  BM   folic acid   1 mg Oral Daily   gabapentin   400 mg Oral TID   And   gabapentin   400 mg Oral QHS   ipratropium-albuterol   3 mL Nebulization TID   lidocaine    Topical BID   polyethylene glycol  17 g Oral BID   senna-docusate  1 tablet Oral BID   [START ON 09/05/2024] thiamine  (VITAMIN B1) injection  100 mg Intravenous Daily   Continuous Infusions:  piperacillin -tazobactam (ZOSYN )  IV 3.375 g (09/04/24 0647)    LOS: 7 days   Deanna JINNY Cage, MD [facility] Available via Epic secure chat 7am-7pm After these hours, please refer to coverage provider listed on [email or web address] 09/04/2024, 11:29 AM

## 2024-09-04 NOTE — TOC Progression Note (Addendum)
 Transition of Care [facility]) - Progression Note    Patient Details  Name: Deanna Goodwin MRN: [id number] Date of Birth: 07-22-1961  [facility] CM/SW Contact  Lauraine FORBES Saa, LCSWA Phone Number: 09/04/2024, 9:14 AM  Clinical Narrative:     9:14 AM [facility] informed CSW that patient's SNF insurance authorization was approved and is valid 12/9-12/23. SNF also informed CSW that they could admit patient today if she is medically ready for discharge. CSW made medical team aware. CSW will continue to follow.  11:54 AM MD informed medical team that patient is anticipated to be able to discharge tomorrow. CSW made SNF aware.  Expected Discharge Plan: Skilled Nursing Facility Barriers to Discharge: Continued Medical Work up, English As A Second Language Teacher, SNF Pending bed offer               Expected Discharge Plan and Services In-house Referral: Clinical Social Work Discharge Planning Services: [employer] Consult Post Acute Care Choice: Skilled Nursing Facility Living arrangements for the past 2 months: Single Family Home                                       Social Drivers of Health (SDOH) Interventions SDOH Screenings   Food Insecurity: No Food Insecurity (08/28/2024)  Housing: High Risk (08/28/2024)  Transportation Needs: No Transportation Needs (08/28/2024)  Utilities: At Risk (08/28/2024)  Alcohol Screen: Low Risk  (09/22/2023)  Depression (PHQ2-9): High Risk (09/22/2023)  Financial Resource Strain: Medium Risk (09/22/2023)  Physical Activity: Inactive (09/22/2023)  Social Connections: Moderately Integrated (09/22/2023)  Stress: No Stress Concern Present (09/22/2023)  Tobacco Use: High Risk (08/28/2024)  Health Literacy: Adequate Health Literacy (09/22/2023)    Readmission Risk Interventions     No data to display

## 2024-09-04 NOTE — Progress Notes (Signed)
 Physical Therapy Treatment Patient Details Name: Deanna Goodwin MRN: [id number] DOB: February 26, 1961 Today's Date: 09/04/2024   History of Present Illness Pt is a 63 y.o. female presenting 12/1 with weakness. Found to have hypokalemia, hypomagnesia. 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 PMH: HTN, neuropathy.    PT Comments  RN provided gabapentin  immediately prior to session. Pt limited in participation by increased pins and needles pain in distal extremities. Pt with decreased awareness of Pure wick malfunction and wet bed pad and gown. Pt requires modAx2 for coming to the EoB and modAx2 for coming to standing in [place] x3. Pt report increased pain and request to return to bed at end of 3rd stand. Pt is making slowed progress toward her goals    If plan is discharge home, recommend the following: Two people to help with walking and/or transfers;A lot of help with bathing/dressing/bathroom;Assistance with cooking/housework;Assist for transportation;Help with stairs or ramp for entrance   Can travel by private vehicle     No  Equipment Recommendations  Wheelchair (measurements PT);Wheelchair cushion (measurements PT);Hoyer lift;Hospital bed    Recommendations for Other Services       Precautions / Restrictions Precautions Precautions: Fall Recall of Precautions/Restrictions: Intact Restrictions Weight Bearing Restrictions Per Provider Order: No     Mobility  Bed Mobility Overal bed mobility: Needs Assistance Bed Mobility: Rolling, Sidelying to Sit Rolling: Min assist, Used rails Sidelying to sit: Mod assist, +2 for physical assistance       General bed mobility comments: requires assistance for rolling to side and modAx2 for pulling up against mobility specialist to come to upright assist of 2 for pulling hips to EoB    Transfers Overall transfer level:  Needs assistance Equipment used: Ambulation equipment used Transfers: Sit to/from Stand, Bed to chair/wheelchair/BSC Sit to Stand: Mod assist, +2 physical assistance           General transfer comment: Pt requires modAx2 for power up from bed surface to [place], once on Stedy pads able to complete 3x sit to stand before reporting increased LE pain cues for upright posture and pelvic tilt Transfer via Lift Equipment: Stedy  Ambulation/Gait               General Gait Details: unable to initiate steps in stedy at this time due to fatigue         Balance Overall balance assessment: Needs assistance Sitting-balance support: Single extremity supported, Feet supported Sitting balance-Leahy Scale: Poor Sitting balance - Comments: right lateral leaning   Standing balance support: Bilateral upper extremity supported, Reliant on assistive device for balance Standing balance-Leahy Scale: Poor Standing balance comment: reliant on Stedy for support both at BUE and at knees to prevent buckling, no lateral LOB in standing                            Communication Communication Communication: Impaired Factors Affecting Communication: Reduced clarity of speech  Cognition Arousal: Alert Behavior During Therapy: WFL for tasks assessed/performed   PT - Cognitive impairments: Memory, Awareness, Problem solving                       PT - Cognition Comments: continues to need increased time with procession and requires increased encouragement for participateion Following commands: Impaired Following commands impaired: Follows one step commands with increased time    Cueing  Cueing Techniques: Verbal cues, Gestural cues  Exercises Other Exercises Other Exercises: sit-stand from stedy flaps x3    General Comments General comments (skin integrity, edema, etc.): VSS on 2L O2, increased encouragement to particiepate VSS through out      Pertinent Vitals/Pain Pain  Assessment Pain Assessment: 0-10 Pain Score: 8  Pain Location: Bil feet and hands Pain Descriptors / Indicators: Pins and needles, Sore, Burning Pain Intervention(s): Limited activity within patient's tolerance, Monitored during session, Premedicated before session     PT Goals (current goals can now be found in the care plan section) Acute Rehab PT Goals Patient Stated Goal: Get well, improve strength PT Goal Formulation: With patient Time For Goal Achievement: 09/11/24 Potential to Achieve Goals: Good Progress towards PT goals: Progressing toward goals    Frequency    Min 2X/week       AM-PAC PT 6 Clicks Mobility   Outcome Measure  Help needed turning from your back to your side while in a flat bed without using bedrails?: A Little Help needed moving from lying on your back to sitting on the side of a flat bed without using bedrails?: A Lot Help needed moving to and from a bed to a chair (including a wheelchair)?: Total Help needed standing up from a chair using your arms (e.g., wheelchair or bedside chair)?: A Lot Help needed to walk in hospital room?: Total Help needed climbing 3-5 steps with a railing? : Total 6 Click Score: 10    End of Session Equipment Utilized During Treatment: Gait belt;Oxygen Activity Tolerance: Patient limited by fatigue;Patient tolerated treatment well Patient left: in bed;with call bell/phone within reach;with bed alarm set;with family/visitor present Nurse Communication: Mobility status;Need for lift equipment PT Visit Diagnosis: Unsteadiness on feet (R26.81);Other abnormalities of gait and mobility (R26.89);Muscle weakness (generalized) (M62.81);History of falling (Z91.81);Difficulty in walking, not elsewhere classified (R26.2);Other symptoms and signs involving the nervous system (R29.898)     Time: 1529-1600 PT Time Calculation (min) (ACUTE ONLY): 31 min  Charges:    $Therapeutic Exercise: 8-22 mins $Therapeutic Activity: 8-22  mins PT General Charges $$ ACUTE PT VISIT: 1 Visit                     Gurney Balthazor B. Fleeta Lapidus PT, DPT Acute Rehabilitation Services Please use secure chat or  Call Office [phone]    Deanna Goodwin 09/04/2024, 6:30 PM

## 2024-09-04 NOTE — Progress Notes (Signed)
 NIF > -40 great effort VC 1.56 L

## 2024-09-04 NOTE — Progress Notes (Signed)
 Speech Language Pathology Treatment: Dysphagia  Patient Details Name: CADE OLBERDING MRN: [id number] DOB: 04-25-1961 Today's Date: 09/04/2024 Time: 8869-8861 SLP Time Calculation (min) (ACUTE ONLY): 8 min  Assessment / Plan / Recommendation Clinical Impression  Pt seen for ongoing dysphagia management.  Per RN, pt had wanted to have a salad which is not on current dysphagia 2 diet.  There was no penetration/aspiration or excessive residue on MBS and pt could safely advance diet texture.  Pt tolerated trials of regular solid.  There was diffuse mild oral residue with solids.  When give water, pt swished independently and fully cleared oral cavity. She is reporting numbness in lips and nose.  Mild dysarthria persists.  Will advance to mechanical soft diet to allow for less restricted food options, but to promote ease of intake.  Follow solids with liquids as needed.  Recommend mechanical soft diet with thin liquids.     HPI HPI: Carmellia Kreisler is a 63 y.o. female presenting from home on 08/27/24 with generalized aches and weakness. 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 She recently presented to the ED on 08/23/2024 with similar symptoms and underwent MRI brain, which showed no evidence of acute process. PmHx significant for HTN, neuropathy, and seizures. SLP consulted for clinical swallow evaluation due to increasing oral weakness and concerns for reduced oral transit of solids. Observed large oral mass vs edema on hard palate. Pt aware of this oral abnormality and reported it has increased in size over the past two years. 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 MBS 12/4: PAS 2 penetration thins, no aspiration; mild oral dysphagia. Adv to D3 on 12/9      SLP Plan  Continue with current plan of care        Swallow Evaluation Recommendations    See above     Recommendations  Diet recommendations: Dysphagia 3  (mechanical soft);Thin liquid Liquids provided via: Cup;Straw Medication Administration: Whole meds with liquid (As tolerated) Supervision: Staff to assist with self feeding Compensations: Slow rate;Small sips/bites;Follow solids with liquid Postural Changes and/or Swallow Maneuvers: Seated upright 90 degrees                  Oral care BID     Dysphagia, oral phase (R13.11)     Continue with current plan of care     Anette FORBES Grippe, MA, CCC-SLP Acute Rehabilitation Services Office: [phone] 09/04/2024, 12:03 PM

## 2024-09-04 NOTE — Evaluation (Signed)
 Speech Language Pathology Evaluation Patient Details Name: Deanna Goodwin MRN: [id number] DOB: [birth year] Today's Date: 09/04/2024 Time: 8881-8869 SLP Time Calculation (min) (ACUTE ONLY): 12 min  Problem List:  Patient Active Problem List   Diagnosis Date Noted   Hypokalemia 08/28/2024   Hypomagnesemia 08/28/2024   History of essential hypertension 08/28/2024   Tobacco abuse 08/28/2024   Generalized weakness 08/27/2024   Sepsis due to urinary tract infection (HCC) 06/26/2014   Septic shock (HCC) 06/26/2014   Obstructive nephropathy 06/26/2014   Past Medical History:  Past Medical History:  Diagnosis Date   Hypertension    Kidney stones    Seizures (HCC)    Past Surgical History:  Past Surgical History:  Procedure Laterality Date   CYSTOSCOPY WITH RETROGRADE PYELOGRAM, URETEROSCOPY AND STENT PLACEMENT Right 06/26/2014   Procedure: CYSTOSCOPY WITH STENT PLACEMENT;  Surgeon: Gretel Ferrara, MD;  Location: [facility];  Service: Urology;  Laterality: Right;   KIDNEY STONE SURGERY Left 2013/2015   LITHOTRIPSY     HPI:  Deanna Goodwin is a 63 y.o. female presenting from home on 08/27/24 with generalized aches and weakness. 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 She recently presented to the ED on 08/23/2024 with similar symptoms and underwent MRI brain, which showed no evidence of acute process. PmHx significant for HTN, neuropathy, and seizures. SLP consulted for clinical swallow evaluation due to increasing oral weakness and concerns for reduced oral transit of solids. Observed large oral mass vs edema on hard palate. Pt aware of this oral abnormality and reported it has increased in size over the past two years. 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 MBS 12/4: PAS 2 penetration thins, no aspiration; mild oral dysphagia. Adv to D3 on 12/9   Assessment / Plan / Recommendation Clinical Impression  Pt presents  with mild cognitive-linguistic deficits.  She was assessed using the SLUMS, scoring a 21 of 30, suggestive of a mild neurocognitive impairment. Pt exhibited deficits in delayed recall for words, and calculations.  Pt was 40% accurate for recall independently, improving to 80% with category cues, and she benefited from [facility] cue for final item.  Recall for narrative was improved. She incorrectly recalled location in story as [place] instead of [place], but still [place]. She believes the purple scrubs provided interference by making her think of [place].  Pt's self reflection on errors is excellent and her errors are fairly logical.  For divergent naming task, she produced animals in alphabetical order (antelope, anteater, bee, beaver, cat, dog, deer, elephat, fox, giraffe, goat, horse, hog), demonstrating good organization and use of word finding strategies.  Despite RUE weakness, pt's clock drawing was relatively well constructed only with reversal of length of hour and minute hands.   Pt presents with a mild dysarthria characterized by consonant imprecision and hypernasality.  Pt reports numbness of lips and nose.  Her soft palate elevates symmetrically  with phonation, but there may not be full closure of VP port.  She is 100% intelligible in conversation.  She appears to be use slow speech rate independently.   Pt would benefit from intervention in house to address the above noted deficits and at present would recommend ST to continue at next level of care.      SLP Assessment  SLP Recommendation/Assessment: Patient needs continued Speech Language Pathology Services SLP Visit Diagnosis: Cognitive communication deficit (R41.841)     Assistance Recommended at Discharge   Intermittent supervision  Functional Status Assessment Patient has had a recent decline in their functional status and demonstrates the ability to make significant improvements in function in a reasonable and  predictable amount of time.  Frequency and Duration min 2x/week  2 weeks      SLP Evaluation Cognition  Overall Cognitive Status: Impaired/Different from baseline Arousal/Alertness: Awake/alert Orientation Level: Oriented X4 Year: 2025 Month: December Day of Week: Correct (self corrected) Attention: Focused Focused Attention: Appears intact Memory: Impaired Memory Impairment: Decreased short term memory Decreased Short Term Memory: Verbal basic Awareness: Appears intact Problem Solving: Impaired Problem Solving Impairment: Functional basic       Comprehension  Auditory Comprehension Overall Auditory Comprehension: Appears within functional limits for tasks assessed Conversation: Complex Visual Recognition/Discrimination Discrimination: Not tested Reading Comprehension Reading Status: Not tested    Expression Expression Primary Mode of Expression: Verbal Verbal Expression Overall Verbal Expression: Appears within functional limits for tasks assessed Written Expression Dominant Hand: Right Written Expression: Not tested   Oral / Motor  Motor Speech Overall Motor Speech: Appears within functional limits for tasks assessed Respiration: Within functional limits Phonation: Normal Resonance: Hypernasality Articulation: Impaired Intelligibility: Intelligible Motor Planning: Within functional limits Motor Speech Errors: Not applicable            Anette FORBES Grippe, MA, CCC-SLP Acute Rehabilitation Services Office: [phone] 09/04/2024, 12:17 PM

## 2024-09-05 LAB — CBC WITH DIFFERENTIAL/PLATELET
Abs Immature Granulocytes: 0.05 K/uL (ref 0.00–0.07)
Basophils Absolute: 0 K/uL (ref 0.0–0.1)
Basophils Relative: 0 %
Eosinophils Absolute: 0.1 K/uL (ref 0.0–0.5)
Eosinophils Relative: 1 %
HCT: 33.9 % — ABNORMAL LOW (ref 36.0–46.0)
Hemoglobin: 11.5 g/dL — ABNORMAL LOW (ref 12.0–15.0)
Immature Granulocytes: 1 %
Lymphocytes Relative: 13 %
Lymphs Abs: 1.4 K/uL (ref 0.7–4.0)
MCH: 34.1 pg — ABNORMAL HIGH (ref 26.0–34.0)
MCHC: 33.9 g/dL (ref 30.0–36.0)
MCV: 100.6 fL — ABNORMAL HIGH (ref 80.0–100.0)
Monocytes Absolute: 0.8 K/uL (ref 0.1–1.0)
Monocytes Relative: 8 %
Neutro Abs: 7.7 K/uL (ref 1.7–7.7)
Neutrophils Relative %: 77 %
Platelets: 255 K/uL (ref 150–400)
RBC: 3.37 MIL/uL — ABNORMAL LOW (ref 3.87–5.11)
RDW: 13.2 % (ref 11.5–15.5)
WBC: 10.1 K/uL (ref 4.0–10.5)
nRBC: 0 % (ref 0.0–0.2)

## 2024-09-05 LAB — BASIC METABOLIC PANEL WITH GFR
Anion gap: 9 (ref 5–15)
BUN: <5 mg/dL — ABNORMAL LOW (ref 8–23)
CO2: 29 mmol/L (ref 22–32)
Calcium: 8.7 mg/dL — ABNORMAL LOW (ref 8.9–10.3)
Chloride: 99 mmol/L (ref 98–111)
Creatinine, Ser: 0.55 mg/dL (ref 0.44–1.00)
GFR, Estimated: >60 mL/min (ref >60)
Glucose, Bld: 97 mg/dL (ref 70–99)
Potassium: 3.6 mmol/L (ref 3.5–5.1)
Sodium: 137 mmol/L (ref 135–145)

## 2024-09-05 LAB — MAGNESIUM: Magnesium: 1.6 mg/dL — ABNORMAL LOW (ref 1.7–2.4)

## 2024-09-05 MED ORDER — SODIUM CHLORIDE 0.9 % IV SOLN
1.0000 g | INTRAVENOUS | Status: DC
  Administered 2024-09-05 – 2024-09-06 (×2): 1 g via INTRAVENOUS
  Filled 2024-09-05 (×2): qty 10

## 2024-09-05 MED ORDER — GABAPENTIN 400 MG PO CAPS
1000.0000 mg | ORAL_CAPSULE | Freq: Every day | ORAL | Status: DC
  Administered 2024-09-05: 1000 mg via ORAL
  Filled 2024-09-05: qty 2

## 2024-09-05 MED ORDER — IPRATROPIUM-ALBUTEROL 0.5-2.5 (3) MG/3ML IN SOLN
3.0000 mL | Freq: Two times a day (BID) | RESPIRATORY_TRACT | Status: DC
  Administered 2024-09-05 – 2024-09-06 (×2): 3 mL via RESPIRATORY_TRACT
  Filled 2024-09-05 (×2): qty 3

## 2024-09-05 MED ORDER — MAGNESIUM SULFATE 2 GM/50ML IV SOLN
2.0000 g | Freq: Once | INTRAVENOUS | Status: AC
  Administered 2024-09-05: 2 g via INTRAVENOUS
  Filled 2024-09-05: qty 50

## 2024-09-05 MED ORDER — CAPSAICIN 0.075 % EX CREA
TOPICAL_CREAM | Freq: Two times a day (BID) | CUTANEOUS | Status: DC
  Filled 2024-09-05: qty 57

## 2024-09-05 MED ORDER — DULOXETINE HCL 30 MG PO CPEP
30.0000 mg | ORAL_CAPSULE | Freq: Every day | ORAL | Status: DC
  Administered 2024-09-05 – 2024-09-06 (×2): 30 mg via ORAL
  Filled 2024-09-05 (×2): qty 1

## 2024-09-05 MED ORDER — THIAMINE MONONITRATE 100 MG PO TABS
100.0000 mg | ORAL_TABLET | Freq: Every day | ORAL | Status: DC
  Administered 2024-09-05 – 2024-09-06 (×2): 100 mg via ORAL
  Filled 2024-09-05 (×2): qty 1

## 2024-09-05 MED ORDER — OXYCODONE HCL 5 MG PO TABS
5.0000 mg | ORAL_TABLET | ORAL | Status: DC | PRN
  Administered 2024-09-05 – 2024-09-06 (×3): 5 mg via ORAL
  Filled 2024-09-05 (×3): qty 1

## 2024-09-05 MED ORDER — CYANOCOBALAMIN 1000 MCG/ML IJ SOLN
1000.0000 ug | Freq: Once | INTRAMUSCULAR | Status: AC
  Administered 2024-09-05: 1000 ug via INTRAMUSCULAR
  Filled 2024-09-05: qty 1

## 2024-09-05 MED ORDER — ACETYLCYSTEINE 20 % IN SOLN
2.0000 mL | Freq: Two times a day (BID) | RESPIRATORY_TRACT | Status: AC
  Administered 2024-09-05 – 2024-09-06 (×2): 2 mL via RESPIRATORY_TRACT
  Filled 2024-09-05 (×2): qty 4

## 2024-09-05 MED ORDER — HYDROMORPHONE HCL 1 MG/ML IJ SOLN: 0.5000 mg | INTRAMUSCULAR | Status: DC | PRN

## 2024-09-05 MED ORDER — GABAPENTIN 300 MG PO CAPS
600.0000 mg | ORAL_CAPSULE | Freq: Two times a day (BID) | ORAL | Status: DC
  Administered 2024-09-05 – 2024-09-06 (×2): 600 mg via ORAL
  Filled 2024-09-05 (×2): qty 2

## 2024-09-05 MED ORDER — GABAPENTIN 400 MG PO CAPS: 400.0000 mg | ORAL_CAPSULE | Freq: Every day | ORAL | Status: DC

## 2024-09-05 NOTE — Plan of Care (Signed)

## 2024-09-05 NOTE — Progress Notes (Signed)
 Pt performed NIF and VC with good effort.  NIF > 30 VC = 1.51

## 2024-09-05 NOTE — TOC Progression Note (Signed)
 Transition of Care [facility]) - Progression Note    Patient Details  Name: KYMBERLI WIEGAND MRN: [id number] Date of Birth: [birth year]  [facility] CM/SW Contact  Lauraine FORBES Saa, LCSWA Phone Number: 09/05/2024, 9:55 AM  Clinical Narrative:     9:55 AM MD informed medical team that patient is not yet ready to discharge to SNF due to pain management. CSW made SNF aware. CSW will continue to follow.  Expected Discharge Plan: Skilled Nursing Facility Barriers to Discharge: Continued Medical Work up, English As A Second Language Teacher, SNF Pending bed offer               Expected Discharge Plan and Services In-house Referral: Clinical Social Work Discharge Planning Services: [employer] Consult Post Acute Care Choice: Skilled Nursing Facility Living arrangements for the past 2 months: Single Family Home                                       Social Drivers of Health (SDOH) Interventions SDOH Screenings   Food Insecurity: No Food Insecurity (08/28/2024)  Housing: High Risk (08/28/2024)  Transportation Needs: No Transportation Needs (08/28/2024)  Utilities: At Risk (08/28/2024)  Alcohol Screen: Low Risk  (09/22/2023)  Depression (PHQ2-9): High Risk (09/22/2023)  Financial Resource Strain: Medium Risk (09/22/2023)  Physical Activity: Inactive (09/22/2023)  Social Connections: Moderately Integrated (09/22/2023)  Stress: No Stress Concern Present (09/22/2023)  Tobacco Use: High Risk (08/28/2024)  Health Literacy: Adequate Health Literacy (09/22/2023)    Readmission Risk Interventions     No data to display

## 2024-09-05 NOTE — Progress Notes (Signed)
 NIF : -30 VC 1.56L Great effort

## 2024-09-05 NOTE — Progress Notes (Signed)
 PROGRESS NOTE    Deanna Goodwin  FMW:[id number] DOB: [birth year] DOA: 08/27/2024 PCP: Tanda Bleacher, MD  No chief complaint on file.   Brief Narrative:   63 year old female with Deanna Goodwin past medical history significant for but not limited to hypertension history of seizures, and history of nephrolithiasis status post ureteroscopy and stent placement with lithotripsy who presents with Deanna Goodwin 1 week history of ascending weakness involving all 4 extremities with primarily in bilateral upper extremities. Pt was recently seen in the ED on 11/27 with similar symptoms and brain MRI done showed no acute process and no evidence of infarct. Given her continued worsening and generalized weakness she presented again and neurology was consulted and recommended MRI C-spine which was negative.  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  Neurology on board, started patient on IVIG.   Now completed IVIG.  Currently being treated for pneumonia.  She has continued severe paresthesias limited her ability to participate with therapy, working on pain management.    Assessment & Plan:   Principal Problem:   Generalized weakness Active Problems:   Hypokalemia   Hypomagnesemia   History of essential hypertension   Tobacco abuse  Generalized Ascending Weakness likely Guillain-Barre Syndrome MRI cervical spine without acute findings MRI brain performed on 08/23/2024 shows no evidence of acute ischemic infarct Thiamine  deficiency as below Negative lyme serology, negative meningitis/encephalitis panel, non reactive VDRL Neurology on board, s/p LP under fluoroscopy by IR on 12/2 showed albuminocytologic dissociation consistent with GBS S/p IVIG on 12/2, x 5 doses, completed on 12/6 She continues to have severe paresthesias Gabapentin , topical lidocaine , will start cymbalta  Will add oxy + dilaudid  for refractory pain (consider addition of ativan  if continued poorly controlled pain) Closely monitor serial NIF/VC q12h with  RT consult PT/OT recommending SNF  [email or web address].foster.[email or web address]  appreciate neurology who forwarded me this resource to consider when managing her pain.     Thiamine  Deficiency S/p 500 mg TID x2 days, followed by 250 mg daily x 5 days.   Continue 100 mg daily  Acute hypoxic respiratory failure likely 2/2 left lower lobe pneumonia Now on 2 L CT PE protocol 12/7 with LLL consolidation  COVID, flu, RSV, respiratory viral panel all negative Urine strep pneumo, Legionella pending collection Blood cultures NGTD Zosyn  12/7-10.  Will narrow to ceftriaxone . 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  Noted bilateral nonobstructing nephrolithiasis Outpatient follow up   Essential Hypertension Amlodipine    Chronic Tobacco Abuse Smokes Deanna Goodwin pack per day for nearly 40 years Nicotine  patch Encourage cessation   Hypoalbuminemia Follow-up   Class I Obesity Body mass index is 33.04 kg/m.    DVT prophylaxis: lovenox  Code Status: full Family Communication: none Disposition:   Status is: Inpatient Remains inpatient appropriate because: need for continued inpatient care   Consultants:  Neurology IR  Procedures:  12/2 Technically successful lumbar puncture under fluoroscopy.   Antimicrobials:  Anti-infectives (From admission,  onward)    Start     Dose/Rate Route Frequency Ordered Stop  09/02/24 2200  piperacillin -tazobactam (ZOSYN ) IVPB 3.375 g       Placed in Followed by Linked Group   3.375 g 12.5 mL/hr over 240 Minutes Intravenous Every 8 hours 09/02/24 1452     09/02/24 1545  piperacillin -tazobactam (ZOSYN ) IVPB 3.375 g       Placed in Followed by Linked Group   3.375 g 100 mL/hr over 30 Minutes Intravenous  Once 09/02/24 1452 09/03/24 0359       Subjective: C/o pins/needs in hands and feet  Objective: Vitals:   09/04/24 2026 09/04/24 2356 09/05/24 0240 09/05/24 0705  BP:  103/73 135/81 (!) 143/82  Pulse:  99 100 98  Resp:  20 18 13   Temp:  98.5 F (36.9 C) 98.2 F (36.8 C) 98.2 F (36.8 C)  TempSrc:  Oral Oral Oral  SpO2: 100% 97% 95% 97%  Weight:      Height:        Intake/Output Summary (Last 24 hours) at 09/05/2024 1020 Last data filed at 09/05/2024 0900 Gross per 24 hour  Intake 656.03 ml  Output 1125 ml  Net -468.97 ml   Filed Weights   09/02/24 0321 09/03/24 0448 09/04/24 0525  Weight: 85.2 kg 85.6 kg 84.6 kg    Examination:  General exam: uncomfortable appearing from paresthesias Respiratory system: Clear to auscultation. Respiratory effort normal. Cardiovascular system: RRR Gastrointestinal system: Abdomen is nondistended, soft and nontender. Central nervous system: Alert and oriented. Generalized weakness throughout - difficulty gripping my hands due to paresthesias Extremities: no LEE - expressed tenderness with me adjusting sheets on her feet    Data Reviewed: I have personally reviewed following labs and imaging studies  CBC: Recent Labs  Lab 08/30/24 0327 09/02/24 1341 09/03/24 1011 09/04/24 0255 09/05/24 0432  WBC 5.4 10.3 10.0 9.4 10.1  NEUTROABS 3.5  --  8.0* 7.5 7.7  HGB 12.4 12.0 11.9* 10.9* 11.5*  HCT 35.8* 35.7* 36.0 32.9* 33.9*  MCV 99.2 101.1* 100.8* 101.2* 100.6*  PLT 186 178 197 206 255    Basic Metabolic Panel: Recent Labs  Lab  08/31/24 0234 09/01/24 0205 09/02/24 0327 09/03/24 0330 09/03/24 1011 09/04/24 0255 09/05/24 0432  NA 138  --  136  --  133* 136 137  K 4.2  --  4.0  --  3.6 3.7 3.6  CL 99  --  96*  --  95* 99 99  CO2 33*  --  28  --  28 27 29   GLUCOSE 102*  --  111*  --  123* 108* 97  BUN 5*  --  <5*  --  <5* <5* <5*  CREATININE 0.56  --  0.52  --  0.55 0.54 0.55  CALCIUM  8.4*  --  9.0  --  8.4* 8.4* 8.7*  MG 1.6* 1.9 1.6* 1.5*  --  1.8 1.6*    GFR: Estimated Creatinine Clearance: 76.1 mL/min (by C-G formula based on SCr of 0.55 mg/dL).  Liver Function Tests: Recent Labs  Lab 08/30/24 0327 08/31/24 0234  AST 55* 65*  ALT 18 17  ALKPHOS 70 82  BILITOT 0.7 0.5  PROT 6.5 6.9  ALBUMIN 2.2* 2.0*    CBG: No results for input(s): GLUCAP in the last 168 hours.   Recent Results (from the past 240 hours)  MRSA Next Gen by PCR, Nasal     Status: None   Collection Time: 08/28/24  3:08 AM   Specimen: Nasal Mucosa; Nasal Swab  Result Value Ref Range Status   MRSA  by PCR Next Gen NOT DETECTED NOT DETECTED Final    Comment: (NOTE) The GeneXpert MRSA Assay (FDA approved for NASAL specimens only), is one component of Marlane Hirschmann comprehensive MRSA colonization surveillance program. It is not intended to diagnose MRSA infection nor to guide or monitor treatment for MRSA infections. Test performance is not FDA approved in patients less than 27 years old. Performed at [facility], [address] [place], [place] [address]   CSF culture w Gram Stain     Status: None   Collection Time: 08/28/24 10:44 AM   Specimen: PATH Cytology CSF; Cerebrospinal Fluid  Result Value Ref Range Status   Specimen Description CSF  Final   Special Requests NONE  Final   Gram Stain NO WBC SEEN NO ORGANISMS SEEN CYTOSPIN SMEAR   Final   Culture   Final    NO GROWTH 3 DAYS Performed at [facility], [address] [place], [place] [address]    Report Status 08/31/2024 FINAL  Final  Culture, blood  (Routine X 2) w Reflex to ID Panel     Status: None (Preliminary result)   Collection Time: 09/02/24  7:01 PM   Specimen: BLOOD LEFT HAND  Result Value Ref Range Status   Specimen Description BLOOD LEFT HAND  Final   Special Requests   Final    BOTTLES DRAWN AEROBIC AND ANAEROBIC Blood Culture adequate volume   Culture   Final    NO GROWTH 3 DAYS Performed at [facility], [address] [place], [place] [address]    Report Status PENDING  Incomplete  Culture, blood (Routine X 2) w Reflex to ID Panel     Status: None (Preliminary result)   Collection Time: 09/02/24  7:07 PM   Specimen: BLOOD RIGHT HAND  Result Value Ref Range Status   Specimen Description BLOOD RIGHT HAND  Final   Special Requests   Final    BOTTLES DRAWN AEROBIC AND ANAEROBIC Blood Culture results may not be optimal due to an inadequate volume of blood received in culture bottles   Culture   Final    NO GROWTH 3 DAYS Performed at [facility], [address] [place], [place] [address]    Report Status PENDING  Incomplete  Resp panel by RT-PCR (RSV, Flu Mehar Kirkwood&B, Covid) Anterior Nasal Swab     Status: None   Collection Time: 09/03/24 11:43 AM   Specimen: Anterior Nasal Swab  Result Value Ref Range Status   SARS Coronavirus 2 by RT PCR NEGATIVE NEGATIVE Final   Influenza Alesi Zachery by PCR NEGATIVE NEGATIVE Final   Influenza B by PCR NEGATIVE NEGATIVE Final    Comment: (NOTE) The Xpert Xpress SARS-CoV-2/FLU/RSV plus assay is intended as an aid in the diagnosis of influenza from Nasopharyngeal swab specimens and should not be used as Silvia Markuson sole basis for treatment. Nasal washings and aspirates are unacceptable for Xpert Xpress SARS-CoV-2/FLU/RSV testing.  Fact Sheet for Patients: bloggercourse.com  Fact Sheet for Healthcare Providers: seriousbroker.it  This test is not yet approved or cleared by the [place]  FDA and has been authorized for detection and/or  diagnosis of SARS-CoV-2 by FDA under an Emergency Use Authorization (EUA). This EUA will remain in effect (meaning this test can be used) for the duration of the COVID-19 declaration under Section 564(b)(1) of the Act, 21 U.S.C. section 360bbb-3(b)(1), unless the authorization is terminated or revoked.     Resp Syncytial Virus by PCR NEGATIVE NEGATIVE Final  Comment: (NOTE) Fact Sheet for Patients: bloggercourse.com  Fact Sheet for Healthcare Providers: seriousbroker.it  This test is not yet approved or cleared by the [place]  FDA and has been authorized for detection and/or diagnosis of SARS-CoV-2 by FDA under an Emergency Use Authorization (EUA). This EUA will remain in effect (meaning this test can be used) for the duration of the COVID-19 declaration under Section 564(b)(1) of the Act, 21 U.S.C. section 360bbb-3(b)(1), unless the authorization is terminated or revoked.  Performed at [facility], [address], [place], [place] [address]   Respiratory (~20 pathogens) panel by PCR     Status: None   Collection Time: 09/03/24 11:43 AM   Specimen: Nasopharyngeal Swab; Respiratory  Result Value Ref Range Status   Adenovirus NOT DETECTED NOT DETECTED Final   Coronavirus 229E NOT DETECTED NOT DETECTED Final    Comment: (NOTE) The Coronavirus on the Respiratory Panel, DOES NOT test for the novel  Coronavirus (2019 nCoV)    Coronavirus HKU1 NOT DETECTED NOT DETECTED Final   Coronavirus NL63 NOT DETECTED NOT DETECTED Final   Coronavirus OC43 NOT DETECTED NOT DETECTED Final   Metapneumovirus NOT DETECTED NOT DETECTED Final   Rhinovirus / Enterovirus NOT DETECTED NOT DETECTED Final   Influenza Ladanian Kelter NOT DETECTED NOT DETECTED Final   Influenza B NOT DETECTED NOT DETECTED Final   Parainfluenza Virus 1 NOT DETECTED NOT DETECTED Final   Parainfluenza Virus 2 NOT DETECTED NOT DETECTED Final   Parainfluenza Virus 3 NOT  DETECTED NOT DETECTED Final   Parainfluenza Virus 4 NOT DETECTED NOT DETECTED Final   Respiratory Syncytial Virus NOT DETECTED NOT DETECTED Final   Bordetella pertussis NOT DETECTED NOT DETECTED Final   Bordetella Parapertussis NOT DETECTED NOT DETECTED Final   Chlamydophila pneumoniae NOT DETECTED NOT DETECTED Final   Mycoplasma pneumoniae NOT DETECTED NOT DETECTED Final    Comment: Performed at [facility], [address] [place], [place] [address]         Radiology Studies: No results found.      Scheduled Meds:  acetylcysteine   2 mL Nebulization TID   amLODipine   5 mg Oral Daily   arformoterol   15 mcg Nebulization BID   budesonide  (PULMICORT ) nebulizer solution  0.25 mg Nebulization BID   dextromethorphan -guaiFENesin   1 tablet Oral BID   DULoxetine   30 mg Oral Daily   enoxaparin  (LOVENOX ) injection  40 mg Subcutaneous Q24H   feeding supplement  237 mL Oral BID BM   folic acid   1 mg Oral Daily   gabapentin   1,000 mg Oral QHS   gabapentin   600 mg Oral BID   ipratropium-albuterol   3 mL Nebulization TID   lidocaine    Topical BID   polyethylene glycol  17 g Oral BID   senna-docusate  1 tablet Oral BID   thiamine   100 mg Oral Daily   Continuous Infusions:  piperacillin -tazobactam (ZOSYN )  IV 3.375 g (09/05/24 0526)     LOS: 8 days    Time spent: over 30 min     Meliton Monte, MD [facility]   To contact the attending provider between 7A-7P or the covering provider during after hours 7P-7A, please log into the web site [email or web address] and access using universal [place] password for that web site. If you do not have the password, please call the hospital operator.  09/05/2024, 10:20 AM

## 2024-09-06 LAB — CBC WITH DIFFERENTIAL/PLATELET
Abs Immature Granulocytes: 0.06 K/uL (ref 0.00–0.07)
Basophils Absolute: 0 K/uL (ref 0.0–0.1)
Basophils Relative: 0 %
Eosinophils Absolute: 0.1 K/uL (ref 0.0–0.5)
Eosinophils Relative: 1 %
HCT: 33.1 % — ABNORMAL LOW (ref 36.0–46.0)
Hemoglobin: 10.9 g/dL — ABNORMAL LOW (ref 12.0–15.0)
Immature Granulocytes: 1 %
Lymphocytes Relative: 13 %
Lymphs Abs: 1.1 K/uL (ref 0.7–4.0)
MCH: 33.6 pg (ref 26.0–34.0)
MCHC: 32.9 g/dL (ref 30.0–36.0)
MCV: 102.2 fL — ABNORMAL HIGH (ref 80.0–100.0)
Monocytes Absolute: 0.7 K/uL (ref 0.1–1.0)
Monocytes Relative: 8 %
Neutro Abs: 6.6 K/uL (ref 1.7–7.7)
Neutrophils Relative %: 77 %
Platelets: 301 K/uL (ref 150–400)
RBC: 3.24 MIL/uL — ABNORMAL LOW (ref 3.87–5.11)
RDW: 13.2 % (ref 11.5–15.5)
WBC: 8.5 K/uL (ref 4.0–10.5)
nRBC: 0 % (ref 0.0–0.2)

## 2024-09-06 LAB — COMPREHENSIVE METABOLIC PANEL WITH GFR
ALT: 33 U/L (ref 0–44)
AST: 70 U/L — ABNORMAL HIGH (ref 15–41)
Albumin: 2 g/dL — ABNORMAL LOW (ref 3.5–5.0)
Alkaline Phosphatase: 151 U/L — ABNORMAL HIGH (ref 38–126)
Anion gap: 9 (ref 5–15)
BUN: 6 mg/dL — ABNORMAL LOW (ref 8–23)
CO2: 31 mmol/L (ref 22–32)
Calcium: 9 mg/dL (ref 8.9–10.3)
Chloride: 93 mmol/L — ABNORMAL LOW (ref 98–111)
Creatinine, Ser: 0.31 mg/dL — ABNORMAL LOW (ref 0.44–1.00)
GFR, Estimated: >60 mL/min (ref >60)
Glucose, Bld: 138 mg/dL — ABNORMAL HIGH (ref 70–99)
Potassium: 3.7 mmol/L (ref 3.5–5.1)
Sodium: 133 mmol/L — ABNORMAL LOW (ref 135–145)
Total Bilirubin: 0.4 mg/dL (ref 0.0–1.2)
Total Protein: 7 g/dL (ref 6.5–8.1)

## 2024-09-06 LAB — MAGNESIUM: Magnesium: 1.7 mg/dL (ref 1.7–2.4)

## 2024-09-06 LAB — PHOSPHORUS: Phosphorus: 3.3 mg/dL (ref 2.5–4.6)

## 2024-09-06 MED ORDER — AMOXICILLIN-POT CLAVULANATE 875-125 MG PO TABS: 1.0000 | ORAL_TABLET | Freq: Two times a day (BID) | ORAL | 0 refills | Status: AC

## 2024-09-06 MED ORDER — GABAPENTIN 300 MG PO CAPS: 600.0000 mg | ORAL_CAPSULE | Freq: Two times a day (BID) | ORAL | Status: AC

## 2024-09-06 MED ORDER — OXYCODONE HCL 5 MG PO TABS: 5.0000 mg | ORAL_TABLET | ORAL | 0 refills | Status: AC | PRN

## 2024-09-06 MED ORDER — IPRATROPIUM-ALBUTEROL 0.5-2.5 (3) MG/3ML IN SOLN: 3.0000 mL | Freq: Two times a day (BID) | RESPIRATORY_TRACT | Status: AC

## 2024-09-06 MED ORDER — GABAPENTIN 100 MG PO CAPS: 1000.0000 mg | ORAL_CAPSULE | Freq: Every day | ORAL | Status: AC

## 2024-09-06 MED ORDER — BUDESONIDE 0.25 MG/2ML IN SUSP: 0.2500 mg | Freq: Two times a day (BID) | RESPIRATORY_TRACT | Status: AC

## 2024-09-06 MED ORDER — GUAIFENESIN ER 600 MG PO TB12: 600.0000 mg | ORAL_TABLET | Freq: Two times a day (BID) | ORAL | Status: AC

## 2024-09-06 MED ORDER — POLYETHYLENE GLYCOL 3350 17 G PO PACK: 17.0000 g | PACK | Freq: Every day | ORAL | 0 refills | Status: AC | PRN

## 2024-09-06 MED ORDER — LIDOCAINE 4 % EX CREA: TOPICAL_CREAM | Freq: Two times a day (BID) | CUTANEOUS | Status: AC

## 2024-09-06 MED ORDER — ARFORMOTEROL TARTRATE 15 MCG/2ML IN NEBU: 15.0000 ug | INHALATION_SOLUTION | Freq: Two times a day (BID) | RESPIRATORY_TRACT | Status: AC

## 2024-09-06 MED ORDER — SENNOSIDES-DOCUSATE SODIUM 8.6-50 MG PO TABS: 1.0000 | ORAL_TABLET | Freq: Two times a day (BID) | ORAL | Status: AC

## 2024-09-06 MED ORDER — ACETAMINOPHEN 325 MG PO TABS: 650.0000 mg | ORAL_TABLET | Freq: Four times a day (QID) | ORAL | Status: AC | PRN

## 2024-09-06 MED ORDER — IBUPROFEN 600 MG PO TABS: 600.0000 mg | ORAL_TABLET | Freq: Four times a day (QID) | ORAL | Status: AC | PRN

## 2024-09-06 NOTE — Discharge Summary (Signed)
 Physician Discharge Summary   Patient: Deanna Goodwin MRN: [id number] DOB: April 10, 1961  Admit date:     08/27/2024  Discharge date: 09/06/2024  Discharge Physician: Sabas GORMAN Brod   PCP: Tanda Bleacher, MD   Recommendations at discharge:   Follow-up [facility] neurology, Dr. Venetia Potters as outpatient Patient to go to skilled nursing facility Follow-up oral surgery as outpatient Respiratory therapy - Every 12 hour NIF and vital capacity    Discharge Diagnoses: Principal Problem:   Generalized weakness Active Problems:   Hypokalemia   Hypomagnesemia   History of essential hypertension   Tobacco abuse  Resolved Problems:   * No resolved hospital problems. *  Hospital Course: 63 year old female with a past medical history significant for but not limited to hypertension history of seizures, and history of nephrolithiasis status post ureteroscopy and stent placement with lithotripsy who presents with a 1 week history of ascending weakness involving all 4 extremities with primarily in bilateral upper extremities. Pt was recently seen in the ED on 11/27 with similar symptoms and brain MRI done showed no acute process and no evidence of infarct. Given her continued worsening and generalized weakness she presented again and neurology was consulted and recommended MRI C-spine which was negative.  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  Neurology on board, started patient on IVIG.    Now completed IVIG.  Currently being treated for pneumonia.  She has continued severe paresthesias limited her ability to participate with therapy, working on pain management.  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 MRI cervical spine without acute findings MRI brain performed on 08/23/2024 shows no evidence of acute ischemic infarct Thiamine  deficiency as below Negative lyme serology, negative meningitis/encephalitis panel, non reactive VDRL Neurology on board, s/p LP  under fluoroscopy by IR on 12/2 showed albuminocytologic dissociation consistent with GBS S/p IVIG on 12/2, x 5 doses, completed on 12/6 Due to severe paresthesias, following medications have been added Gabapentin , topical lidocaine , will start cymbalta  Will add oxycodone  Closely monitor serial NIF/VC q12h with RT consult PT/OT recommending SNF      Thiamine  Deficiency S/p 500 mg TID x2 days, followed by 250 mg daily x 5 days.   Continue 100 mg daily   Acute hypoxic respiratory failure likely 2/2 left lower lobe pneumonia Now on 2 L CT PE protocol 12/7 with LLL consolidation  COVID, flu, RSV, respiratory viral panel all negative Urine strep pneumo, Legionella pending collection Blood cultures NGTD Zosyn  12/7-10.  Incentive spirometry/flutter valve Supplemental O2 as needed -Will discharge on Augmentin  1 tablet p.o. 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  Noted bilateral nonobstructing nephrolithiasis Outpatient follow up   Essential Hypertension Amlodipine    Chronic Tobacco Abuse Smokes a pack per day for nearly 40 years Encourage cessation   Hypoalbuminemia Follow-up as outpatient   Class I Obesity Body mass index is 33.04 kg/m.    Assessment and Plan: No notes have been filed under this hospital service. Service: Hospitalist        Consultants: Neurology  Procedures  performed: Lumbar puncture Disposition: Home Diet recommendation:  Discharge Diet Orders (From admission, onward)     Start     Ordered   09/06/24 0000  Diet - low sodium heart healthy        09/06/24 0948           Regular diet DISCHARGE MEDICATION: Allergies as of 09/06/2024   No Known Allergies      Medication List     STOP taking these medications    hydrocortisone  2.5 % rectal cream Commonly known as: ANUSOL -HC       TAKE these medications    acetaminophen  325 MG tablet Commonly known as: TYLENOL  Take 2 tablets (650 mg total) by mouth every 6 (six) hours as needed for mild pain (pain score 1-3) (or Fever >/= 101).   amLODipine  5 MG tablet Commonly known as: NORVASC  Take 1 tablet (5 mg total) by mouth daily. Start taking on: September 07, 2024   amoxicillin -clavulanate 875-125 MG tablet Commonly known as: AUGMENTIN  Take 1 tablet by mouth 2 (two) times daily for 5 days.   arformoterol  15 MCG/2ML Nebu Commonly known as: BROVANA  Take 2 mLs (15 mcg total) by nebulization 2 (two) times daily.   budesonide  0.25 MG/2ML nebulizer solution Commonly known as: PULMICORT  Take 2 mLs (0.25 mg total) by nebulization 2 (two) times daily.   DULoxetine  30 MG capsule Commonly known as: CYMBALTA  Take 1 capsule (30 mg total) by mouth daily. Start taking on: September 07, 2024   folic acid  1 MG tablet Commonly known as: FOLVITE  Take 1 tablet (1 mg total) by mouth daily. Start taking on: September 07, 2024   gabapentin  100 MG capsule Commonly known as: NEURONTIN  Take 10 capsules (1,000 mg total) by mouth at bedtime.   gabapentin  300 MG capsule Commonly known as: NEURONTIN  Take 2 capsules (600 mg total) by mouth 2 (two) times daily at 10 am and 4 pm.   guaiFENesin  600 MG 12 hr tablet Commonly known as: Mucinex  Take 1 tablet (600 mg total) by mouth 2 (two) times daily for 14 days.   hydrocortisone  25 MG suppository Commonly known as: ANUSOL -HC Place 1  suppository (25 mg total) rectally 2 (two) times daily.   ibuprofen  600 MG tablet Commonly known as: ADVIL  Take 1 tablet (600 mg total) by mouth every 6 (six) hours as needed for moderate pain (pain score 4-6), fever or headache. What changed:  medication strength how much to take reasons to take this   ipratropium-albuterol  0.5-2.5 (3) MG/3ML Soln Commonly known as: DUONEB Take 3 mLs by nebulization 2 (two) times daily.   lidocaine  4 % cream Commonly known as: LMX Apply topically 2 (two) times daily. Apply to both feet   nystatin  cream Commonly known as: MYCOSTATIN  Apply to affected area 2 times daily   oxyCODONE  5 MG immediate release tablet Commonly known as: Oxy IR/ROXICODONE  Take 1 tablet (5 mg total) by mouth every 4 (four) hours as needed for severe pain (pain score 7-10).   polyethylene glycol 17 g packet Commonly known as: MIRALAX  / GLYCOLAX  Take 17 g by mouth daily as needed.   senna-docusate 8.6-50 MG tablet Commonly known as: Senokot-S Take 1 tablet by mouth 2 (two) times daily for 10 days.   thiamine  100 MG tablet Commonly known as: Vitamin B-1 Take 1 tablet (100 mg total) by mouth daily. Start taking on: September 07, 2024        Contact information for follow-up providers     [place],  Venetia CROME, MD. Schedule an appointment as soon as possible for a visit.   Specialty: Neurology Contact information: [address] [place] [place] [address] [phone]              Contact information for after-discharge care     Destination     [facility] .   Service: Skilled Nursing Contact information: [address] [place]  [address] [phone]                    Discharge Exam: Fredricka Weights   09/03/24 0448 09/04/24 0525 09/06/24 0304  Weight: 85.6 kg 84.6 kg 82.7 kg   General-appears in no acute distress Heart-S1-S2, regular, no murmur auscultated Lungs-clear to auscultation bilaterally, no wheezing or  crackles auscultated Abdomen-soft, nontender, no organomegaly Extremities-no edema in the lower extremities Neuro-alert, oriented x3, no focal deficit noted  Condition at discharge: good  The results of significant diagnostics from this hospitalization (including imaging, microbiology, ancillary and laboratory) are listed below for reference.   Imaging Studies: CT Angio Chest Pulmonary Embolism (PE) W or WO Contrast Result Date: 09/02/2024 EXAM: CTA of the Chest with contrast for PE 09/02/2024 04:58:26 PM TECHNIQUE: CTA of the chest was performed without and with the administration of 75 mL of iohexol  (OMNIPAQUE ) 350 MG/ML injection. Multiplanar reformatted images are provided for review. MIP images are provided for review. Automated exposure control, iterative reconstruction, and/or weight based adjustment of the mA/kV was utilized to reduce the radiation dose to as low as reasonably achievable. COMPARISON: Chest x-ray today. CLINICAL HISTORY: Pulmonary embolism (PE) suspected, low to intermediate prob, positive D-dimer; CXR with pneumonia. FINDINGS: PULMONARY ARTERIES: Pulmonary arteries are adequately opacified for evaluation. No pulmonary embolism. Main pulmonary artery is normal in caliber. MEDIASTINUM: The heart and pericardium demonstrate no acute abnormality. Aortic atherosclerosis. There is no acute abnormality of the thoracic aorta. LYMPH NODES: No mediastinal, hilar or axillary lymphadenopathy. LUNGS AND PLEURA: Consolidation in the left lower lobe compatible with pneumonia. Plate-like atelectasis in the right lower lobe. No pleural effusion or pneumothorax. UPPER ABDOMEN: Stranding and fluid collections again noted around the pancreatic body and tail, similar to prior abdominal CT from 08/29/2024. SOFT TISSUES AND BONES: No acute bone or soft tissue abnormality. IMPRESSION: 1. No pulmonary embolism. 2. Left lower lobe consolidation consistent with pneumonia. 3. Pancreatitis changes stable  since prior abdominal CT. Electronically signed by: Franky Crease MD 09/02/2024 05:09 PM EST RP Workstation: [id number]   DG CHEST PORT 1 VIEW Result Date: 09/02/2024 EXAM: 1 VIEW(S) XRAY OF THE CHEST 09/02/2024 12:26:00 PM COMPARISON: 06/26/2014 . CLINICAL HISTORY: Tachycardia FINDINGS: LUNGS AND PLEURA: Left lower lobe consolidation. Right lower lung subsegmental atelectasis. Possible small left pleural effusion. No pneumothorax. HEART AND MEDIASTINUM: Cardiomegaly. BONES AND SOFT TISSUES: No acute osseous abnormality. IMPRESSION: 1. Left lower lobe consolidation with possible small left pleural effusion. 2. Cardiomegaly. 3. Right lower lung subsegmental atelectasis. Electronically signed by: Camellia Candle MD 09/02/2024 12:47 PM EST RP Workstation: [id number]   DG Swallowing Func-Speech Pathology Result Date: 08/31/2024 Table formatting from the original result was not included. Modified Barium Swallow Study Patient Details Name: OLINA MELFI MRN: [id number] Date of Birth: [birth year] Today's Date: 08/31/2024 HPI/PMH: HPI: Ms. Niehoff is a 63 y.o. female presenting from home on 08/27/24 with generalized aches and weakness. Current workup is ongoing for Guillan-Barre Syndrome and is currently undergoing IVIG tx.She recently presented to the ED on 08/23/2024 with similar symptoms and underwent a MRI brain, which showed no  evidence of acute process. PmHx significant for HTN, neuropathy, and seizures. SLP consulted for clinical swallow evaluation due to increasing oral weakness and concerns for reduced oral transit of solids. Observed large oral mass vs edema on hard palate. Pt aware of this oral abnormality and reported it has increased in size over the past two years. 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 Clinical Impression: Clinical Impression: Pt presents with a mild oral dysphagia. She is protecting her airway - there was no aspiration. There is some decreased  mobility of the larynx, leading to occasional transient penetration of thin liquids (PAS 2- considered WFL). There is slowed manipulation and propulsion of solid foods - potentially related to neuromuscular impact of GB or a compensation for the large bony outgrowths on the hard palate (tori palatini).  Pt is safe to continue a PO diet -recommend continuing dysphagia 2/thin liquds for now.  SLP will follow for diet advancement/education as needed. Pt would benefit from f/u with her dentist to address the palatal issues s/p D/C. Factors that may increase risk of adverse event in presence of aspiration Noe & Lianne 2021): No data recorded Recommendations/Plan: Swallowing Evaluation Recommendations Swallowing Evaluation Recommendations Recommendations: PO diet PO Diet Recommendation: Dysphagia 2 (Finely chopped); Thin liquids (Level 0) Liquid Administration via: Cup; Straw Medication Administration: Whole meds with liquid Supervision: Staff to assist with self-feeding Swallowing strategies  : Small bites/sips Postural changes: Position pt fully upright for meals Oral care recommendations: Oral care BID (2x/day) Treatment Plan Treatment Plan Treatment recommendations: Therapy as outlined in treatment plan below Follow-up recommendations: No SLP follow up Treatment frequency: Min 2x/week Treatment duration: 1 week Interventions: Diet toleration management by SLP; Trials of upgraded texture/liquids; Patient/family education Recommendations Recommendations for follow up therapy are one component of a multi-disciplinary discharge planning process, led by the attending physician.  Recommendations may be updated based on patient status, additional functional criteria and insurance authorization. Assessment: Orofacial Exam: Orofacial Exam Oral Cavity: Oral Hygiene: WFL Oral Cavity - Dentition: Adequate natural dentition Orofacial Anatomy: Other (comment) (see HPI) Oral Motor/Sensory Function: WFL Anatomy: Anatomy: Other  (Comment); Suspected cervical osteophytes (tori palatini; osteophytes not dx'd on CT cervical spine) Boluses Administered: Boluses Administered Boluses Administered: Thin liquids (Level 0); Mildly thick liquids (Level 2, nectar thick); Puree; Solid  Oral Impairment Domain: Oral Impairment Domain Lip Closure: No labial escape Tongue control during bolus hold: Cohesive bolus between tongue to palatal seal Bolus preparation/mastication: Slow prolonged chewing/mashing with complete recollection Bolus transport/lingual motion: Slow tongue motion Oral residue: Trace residue lining oral structures Location of oral residue : Palate; Tongue Initiation of pharyngeal swallow : Pyriform sinuses  Pharyngeal Impairment Domain: Pharyngeal Impairment Domain Soft palate elevation: No bolus between soft palate (SP)/pharyngeal wall (PW) Laryngeal elevation: Partial superior movement of thyroid  cartilage/partial approximation of arytenoids to epiglottic petiole Anterior hyoid excursion: Complete anterior movement Epiglottic movement: Complete inversion Laryngeal vestibule closure: Complete, no air/contrast in laryngeal vestibule Pharyngeal stripping wave : Present - complete Pharyngeal contraction (A/P view only): N/A Pharyngoesophageal segment opening: Complete distension and complete duration, no obstruction of flow Tongue base retraction: No contrast between tongue base and posterior pharyngeal wall (PPW) Pharyngeal residue: Complete pharyngeal clearance Location of pharyngeal residue: N/A  Esophageal Impairment Domain: Esophageal Impairment Domain Esophageal clearance upright position: Esophageal retention Pill: Pill Consistency administered: Thin liquids (Level 0) Thin liquids (Level 0): [facility] Penetration/Aspiration Scale Score: Penetration/Aspiration Scale Score 2.  Material enters airway, remains ABOVE vocal cords then ejected out: Mildly thick  liquids (Level 2, nectar thick); Puree; Solid; Pill 3.  Material enters airway, remains  ABOVE vocal cords and not ejected out: Thin liquids (Level 0) Compensatory Strategies: Compensatory Strategies Compensatory strategies: Yes Straw: Effective (n)   General Information: Caregiver present: No  Diet Prior to this Study: Dysphagia 2 (finely chopped); Thin liquids (Level 0)   Temperature : Normal   Respiratory Status: WFL   Supplemental O2: None (Room air)   History of Recent Intubation: No  Behavior/Cognition: Alert; Cooperative Self-Feeding Abilities: Able to self-feed; Needs assist with self-feeding Baseline vocal quality/speech: Hypophonia/low volume No data recorded Volitional Swallow: Able to elicit Exam Limitations: No limitations Goal Planning: Prognosis for improved oropharyngeal function: Good No data recorded No data recorded No data recorded No data recorded Pain: Pain Assessment Pain Assessment: 0-10 Pain Score: 10 Pain Location: bil feet - brief stabs of pain Pain Intervention(s): Limited activity within patient's tolerance End of Session: Start Time:SLP Start Time (ACUTE ONLY): 1330 Stop Time: SLP Stop Time (ACUTE ONLY): 1357 Time Calculation:SLP Time Calculation (min) (ACUTE ONLY): 27 min Charges: SLP Evaluations $ SLP Speech Visit: 1 Visit SLP Evaluations $MBS Swallow: 1 Procedure SLP visit diagnosis: SLP Visit Diagnosis: Dysphagia, oral phase (R13.11) Past Medical History: Past Medical History: Diagnosis Date  Hypertension   Kidney stones   Seizures (HCC)  Past Surgical History: Past Surgical History: Procedure Laterality Date  CYSTOSCOPY WITH RETROGRADE PYELOGRAM, URETEROSCOPY AND STENT PLACEMENT Right 06/26/2014  Procedure: CYSTOSCOPY WITH STENT PLACEMENT;  Surgeon: Gretel Ferrara, MD;  Location: [facility];  Service: Urology;  Laterality: Right;  KIDNEY STONE SURGERY Left 2013/2015  LITHOTRIPSY   [name] 08/31/2024, 11:04 AM  CT MAXILLOFACIAL W CONTRAST Result Date: 08/30/2024 EXAM: CT Face with contrast 08/29/2024 10:17:01 PM TECHNIQUE: CT of the face was performed with  the administration of 75 mL of iohexol  (OMNIPAQUE ) 350 MG/ML injection. Multiplanar reformatted images are provided for review. Automated exposure control, iterative reconstruction, and/or weight based adjustment of the mA/kV was utilized to reduce the radiation dose to as low as reasonably achievable. 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 ??large tonsils. FINDINGS: AERODIGESTIVE TRACT: No tonsillar enlargement. No mass. No edema. SALIVARY GLANDS: No acute abnormality. LYMPH NODES: No suspicious cervical lymphadenopathy. SOFT TISSUES: No mass or fluid collection. BRAIN, ORBITS AND SINUSES: No acute abnormality. BONES: Large tori palatini. No acute abnormality. No suspicious bone lesion. IMPRESSION: 1. Large tori palatini, likely accounting for the visualized oral cavity mass. 2. No tonsillar enlargement. Electronically signed by: Franky Stanford MD 08/30/2024 01:00 AM EST RP Workstation: [id number]   CT ABDOMEN PELVIS W CONTRAST Result Date: 08/30/2024 EXAM: CT ABDOMEN AND PELVIS WITH CONTRAST 08/29/2024 10:17:01 PM TECHNIQUE: CT of the abdomen and pelvis was performed with the administration of 75 mL of iohexol  (OMNIPAQUE ) 350 MG/ML injection. Multiplanar reformatted images are provided for review. Automated exposure control, iterative reconstruction, and/or weight-based adjustment of the mA/kV was utilized to reduce the radiation dose to as low as reasonably achievable. COMPARISON: 08/18/2024 CLINICAL HISTORY: Recent acute pancreatitis. Repeat CT to rule out focal pancreatic lesion. FINDINGS: LOWER CHEST: Left lower lobe atelectasis and trace left pleural effusion. LIVER: Focal fat deposition along the falciform ligament. GALLBLADDER AND BILE DUCTS: Gallbladder is unremarkable. No biliary ductal dilatation. SPLEEN: No acute abnormality. PANCREAS: Decreased peripancreatic edema and stranding compared to 08/18/2024, compatible with improving pancreatitis. Complex  fluid collection about the tail of the pancreas extending to the splenic hilum is slightly decreased in size now measuring  5.1 x 3.5 cm in the axial plane, previously 6.7 x 3.7 cm using similar technique. No pancreatic necrosis. No pancreatic ductal dilation. No CT evidence of pancreatic mass. Improving interstitial pancreatitis. ADRENAL GLANDS: No acute abnormality. KIDNEYS, URETERS AND BLADDER: Large calculus in the upper pole of the right kidney. Punctate nonobstructing calculi in the lower pole of the left kidney. No hydronephrosis. No perinephric or periureteral stranding. Urinary bladder is unremarkable. GI AND BOWEL: Stomach demonstrates no acute abnormality. Wall thickening, mucosal hyperenhancement, and pericolonic fat stranding about the ascending colon and cecum. There is no bowel obstruction. No pneumatosis. PERITONEUM AND RETROPERITONEUM: No free air. VASCULATURE: Aorta is normal in caliber. LYMPH NODES: No lymphadenopathy. REPRODUCTIVE ORGANS: Calcified uterine fibroid. BONES AND SOFT TISSUES: No acute osseous abnormality. No focal soft tissue abnormality. IMPRESSION: 1. Nonspecific colitis of the cecum and ascending colon, likely infectious/inflammatory. Ischemic colitis is considered less likely but not excluded. 2. Improving interstitial pancreatitis. No pancreatic necrosis or ductal dilation. No CT evidence of pancreatic mass. 3. Bilateral nonobstructing nephrolithiasis. Electronically signed by: Norman Gatlin MD 08/30/2024 12:27 AM EST RP Workstation: [id number]   DG Lumbar Puncture Fluoro Guide Result Date: 08/28/2024 CLINICAL DATA:  Patient admitted with ascending weakness and numbness with concern for Guillain-Barre syndrome. Diagnostic lumbar puncture requested. EXAM: LUMBAR PUNCTURE UNDER FLUOROSCOPY PROCEDURE: An appropriate skin entry site was determined fluoroscopically. Operator donned sterile gloves and mask. Skin site was marked, then prepped with Betadine, draped in usual sterile  fashion, and infiltrated locally with 1% lidocaine . A 20 gauge spinal needle advanced into the thecal sac at L2-L3. Clear colorless CSF spontaneously returned, with opening pressure of 24 cm water. 12.4 ml CSF were collected and divided among 4 sterile vials for the requested laboratory studies. Closing pressure 15 cm water The needle was then removed. The patient tolerated the procedure well and there were no complications. FLUOROSCOPY: Radiation Exposure Index (as provided by the fluoroscopic device): 26.1 mGy Kerma IMPRESSION: Technically successful lumbar puncture under fluoroscopy. This exam was performed by Warren Dais, NP, and was supervised and interpreted by Dr. Philip. Electronically Signed   By: Juliene Philip M.D.   On: 08/28/2024 11:39   MR Cervical Spine W and Wo Contrast Result Date: 08/28/2024 EXAM: MRI CERVICAL SPINE WITH AND WITHOUT CONTRAST 08/28/2024 12:25:32 AM TECHNIQUE: Multiplanar multisequence MRI of the cervical spine was performed without and with the administration of 10 mL of gadobutrol  (GADAVIST ) 1 mmol/mL injection. COMPARISON: None available. CLINICAL HISTORY: Myelopathy, acute, cervical spine; ?Guillain-Barr. FINDINGS: BONES AND ALIGNMENT: Normal alignment. Normal vertebral body heights. Marrow signal is unremarkable. No abnormal enhancement. SPINAL CORD: Normal spinal cord size. Normal spinal cord signal. SOFT TISSUES: No paraspinal mass. SKULL BASE: Partially empty sella turcica. C2-C3: No significant disc herniation. No spinal canal stenosis or neural foraminal narrowing. C3-C4: No significant disc herniation. No spinal canal stenosis or neural foraminal narrowing. C4-C5: No significant disc herniation. No spinal canal stenosis or neural foraminal narrowing. C5-C6: No significant disc herniation. No spinal canal stenosis or neural foraminal narrowing. C6-C7: No significant disc herniation. No spinal canal stenosis or neural foraminal narrowing. C7-T1: No significant disc  herniation. No spinal canal stenosis or neural foraminal narrowing. IMPRESSION: 1. No acute findings. Electronically signed by: Franky Stanford MD 08/28/2024 12:35 AM EST RP Workstation: [id number]   MR BRAIN WO CONTRAST Result Date: 08/23/2024 CLINICAL DATA:  Initial evaluation for acute neuro deficit, stroke suspected. EXAM: MRI HEAD WITHOUT CONTRAST TECHNIQUE: Multiplanar, multiecho pulse sequences of the brain and surrounding structures were obtained  without intravenous contrast. COMPARISON:  CT from earlier the same day. FINDINGS: Brain: Cerebral volume within normal limits for age. Few scattered subcentimeter foci of T2/FLAIR hyperintensity noted involving the supratentorial cerebral white matter, nonspecific, but most commonly related to chronic microvascular ischemic disease. Changes are mild for age. No evidence for acute or subacute infarct. Or is a chronic cortical infarction. No acute or chronic intracranial blood products. No mass lesion, midline shift or mass effect. No hydrocephalus or extra-axial fluid collection. Empty sella noted. Vascular: Major intracranial vascular flow voids are maintained. Skull and upper cervical spine: Craniocervical junction within normal limits. Bone marrow signal intensity mildly heterogeneous and decreased on T1 weighted imaging, nonspecific, but most commonly related to anemia, smoking or obesity. No scalp soft tissue abnormality. Sinuses/Orbits: Globes orbital soft tissues within normal limits. Scattered mucosal thickening about the ethmoidal air cells and maxillary sinuses. Paranasal sinuses are otherwise clear. No mastoid effusion. Other: None. IMPRESSION: 1. No acute intracranial abnormality. 2. 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 Electronically Signed   By: Morene Hoard M.D.   On: 08/23/2024 22:27   CT Head Wo Contrast Result Date: 08/23/2024 EXAM: CT HEAD WITHOUT  CONTRAST 08/23/2024 05:53:00 PM TECHNIQUE: CT of the head was performed without the administration of intravenous contrast. Automated exposure control, iterative reconstruction, and/or weight based adjustment of the mA/kV was utilized to reduce the radiation dose to as low as reasonably achievable. COMPARISON: None available. CLINICAL HISTORY: 63 year old female with acute neuro deficit, suspected stroke, and general numbness. FINDINGS: BRAIN AND VENTRICLES: No acute hemorrhage. No evidence of acute infarct. No hydrocephalus. No extra-axial collection. No mass effect or midline shift. Brain volume within normal limits for age. Gray white differentiation within normal limits for age. Empty sella appearance, nonspecific . No suspicious intracranial vascular hyperdensity. ORBITS: No acute abnormality. No gaze deviation. SINUSES: Minor paranasal sinus mucosal thickening. Visible middle ears and mastoids are well aerated. SOFT TISSUES AND SKULL: No acute soft tissue abnormality. No skull fracture. IMPRESSION: 1. No acute intracranial abnormality. Negative for age non-contrast CT appearance of the brain. Electronically signed by: Helayne Hurst MD 08/23/2024 06:00 PM EST RP Workstation: [id number]   CT ABDOMEN PELVIS W CONTRAST Result Date: 08/18/2024 EXAM: CT ABDOMEN AND PELVIS WITH CONTRAST 08/18/2024 05:40:27 PM TECHNIQUE: CT of the abdomen and pelvis was performed with the administration of 75 mL of iohexol  (OMNIPAQUE ) 350 MG/ML injection. Multiplanar reformatted images are provided for review. Automated exposure control, iterative reconstruction, and/or weight-based adjustment of the mA/kV was utilized to reduce the radiation dose to as low as reasonably achievable. COMPARISON: None available. CLINICAL HISTORY: LLQ abdominal pain. FINDINGS: LOWER CHEST: Linear atelectasis or scarring in the lung bases. LIVER: The liver is unremarkable. GALLBLADDER AND BILE DUCTS: Gallbladder is unremarkable. No biliary ductal  dilatation. SPLEEN: Spleen is normal. PANCREAS: Edema and stranding around the body and tail of the pancreas with a complex fluid collection adjacent to the tail of the pancreas and extending to the spleen hilum measuring 4.3 cm diameter. Changes are consistent with acute pancreatitis with developing peripancreatic collection. Follow-up for resolution of acute process is recommended to exclude an underlying focal pancreatic lesion. ADRENAL GLANDS: No acute abnormality. KIDNEYS, URETERS AND BLADDER: Multiple bilateral renal stones. The largest on the right measures 2.2 cm diameter. No hydronephrosis. No change since prior study. No perinephric or periureteral stranding. Urinary bladder is unremarkable. GI AND BOWEL: Stomach, small bowel, colon, and appendix are normal. There is no  bowel obstruction. PERITONEUM AND RETROPERITONEUM: No ascites. No free air. No free fluid in the abdomen. VASCULATURE: Aorta is normal in caliber. LYMPH NODES: No lymphadenopathy. REPRODUCTIVE ORGANS: Diffuse enlargement of the uterus consistent with uterine fibroids. No abnormal adnexal masses. BONES AND SOFT TISSUES: No acute osseous abnormality. No focal soft tissue abnormality. IMPRESSION: 1. Acute pancreatitis with developing peripancreatic collection; recommend imaging follow-up to document resolution and exclude an underlying focal pancreatic lesion. 2. Multiple bilateral renal stones without hydronephrosis, largest on the right measuring 2.2 cm, unchanged from prior study. 3. Diffuse uterine enlargement consistent with fibroids, without abnormal adnexal masses. Electronically signed by: Elsie Gravely MD 08/18/2024 05:47 PM EST RP Workstation: [id number]    Microbiology: Results for orders placed or performed during the hospital encounter of 08/27/24  MRSA Next Gen by PCR, Nasal     Status: None   Collection Time: 08/28/24  3:08 AM   Specimen: Nasal Mucosa; Nasal Swab  Result Value Ref Range Status   MRSA by PCR Next Gen  NOT DETECTED NOT DETECTED Final    Comment: (NOTE) The GeneXpert MRSA Assay (FDA approved for NASAL specimens only), is one component of a comprehensive MRSA colonization surveillance program. It is not intended to diagnose MRSA infection nor to guide or monitor treatment for MRSA infections. Test performance is not FDA approved in patients less than 75 years old. Performed at [facility], [address], [place], [place] [address]   CSF culture w Gram Stain     Status: None   Collection Time: 08/28/24 10:44 AM   Specimen: PATH Cytology CSF; Cerebrospinal Fluid  Result Value Ref Range Status   Specimen Description CSF  Final   Special Requests NONE  Final   Gram Stain NO WBC SEEN NO ORGANISMS SEEN CYTOSPIN SMEAR   Final   Culture   Final    NO GROWTH 3 DAYS Performed at [facility], [address] [place], [place] [address]    Report Status 08/31/2024 FINAL  Final  Culture, blood (Routine X 2) w Reflex to ID Panel     Status: None (Preliminary result)   Collection Time: 09/02/24  7:01 PM   Specimen: BLOOD LEFT HAND  Result Value Ref Range Status   Specimen Description BLOOD LEFT HAND  Final   Special Requests   Final    BOTTLES DRAWN AEROBIC AND ANAEROBIC Blood Culture adequate volume   Culture   Final    NO GROWTH 4 DAYS Performed at [facility], [address], [place], [place] [address]    Report Status PENDING  Incomplete  Culture, blood (Routine X 2) w Reflex to ID Panel     Status: None (Preliminary result)   Collection Time: 09/02/24  7:07 PM   Specimen: BLOOD RIGHT HAND  Result Value Ref Range Status   Specimen Description BLOOD RIGHT HAND  Final   Special Requests   Final    BOTTLES DRAWN AEROBIC AND ANAEROBIC Blood Culture results may not be optimal due to an inadequate volume of blood received in culture bottles   Culture   Final    NO GROWTH 4 DAYS Performed at [facility], [address], [place], [place] [address]    Report  Status PENDING  Incomplete  Resp panel by RT-PCR (RSV, Flu A&B, Covid) Anterior Nasal Swab     Status: None   Collection Time: 09/03/24 11:43 AM   Specimen: Anterior Nasal Swab  Result Value Ref Range Status  SARS Coronavirus 2 by RT PCR NEGATIVE NEGATIVE Final   Influenza A by PCR NEGATIVE NEGATIVE Final   Influenza B by PCR NEGATIVE NEGATIVE Final    Comment: (NOTE) The Xpert Xpress SARS-CoV-2/FLU/RSV plus assay is intended as an aid in the diagnosis of influenza from Nasopharyngeal swab specimens and should not be used as a sole basis for treatment. Nasal washings and aspirates are unacceptable for Xpert Xpress SARS-CoV-2/FLU/RSV testing.  Fact Sheet for Patients: bloggercourse.com  Fact Sheet for Healthcare Providers: seriousbroker.it  This test is not yet approved or cleared by the [place]  FDA and has been authorized for detection and/or diagnosis of SARS-CoV-2 by FDA under an Emergency Use Authorization (EUA). This EUA will remain in effect (meaning this test can be used) for the duration of the COVID-19 declaration under Section 564(b)(1) of the Act, 21 U.S.C. section 360bbb-3(b)(1), unless the authorization is terminated or revoked.     Resp Syncytial Virus by PCR NEGATIVE NEGATIVE Final    Comment: (NOTE) Fact Sheet for Patients: bloggercourse.com  Fact Sheet for Healthcare Providers: seriousbroker.it  This test is not yet approved or cleared by the [place]  FDA and has been authorized for detection and/or diagnosis of SARS-CoV-2 by FDA under an Emergency Use Authorization (EUA). This EUA will remain in effect (meaning this test can be used) for the duration of the COVID-19 declaration under Section 564(b)(1) of the Act, 21 U.S.C. section 360bbb-3(b)(1), unless the authorization is terminated or revoked.  Performed at [facility] [place] [facility], [address], [place], [place] [address]   Respiratory (~20 pathogens) panel by PCR     Status: None   Collection Time: 09/03/24 11:43 AM   Specimen: Nasopharyngeal Swab; Respiratory  Result Value Ref Range Status   Adenovirus NOT DETECTED NOT DETECTED Final   Coronavirus 229E NOT DETECTED NOT DETECTED Final    Comment: (NOTE) The Coronavirus on the Respiratory Panel, DOES NOT test for the novel  Coronavirus (2019 nCoV)    Coronavirus HKU1 NOT DETECTED NOT DETECTED Final   Coronavirus NL63 NOT DETECTED NOT DETECTED Final   Coronavirus OC43 NOT DETECTED NOT DETECTED Final   Metapneumovirus NOT DETECTED NOT DETECTED Final   Rhinovirus / Enterovirus NOT DETECTED NOT DETECTED Final   Influenza A NOT DETECTED NOT DETECTED Final   Influenza B NOT DETECTED NOT DETECTED Final   Parainfluenza Virus 1 NOT DETECTED NOT DETECTED Final   Parainfluenza Virus 2 NOT DETECTED NOT DETECTED Final   Parainfluenza Virus 3 NOT DETECTED NOT DETECTED Final   Parainfluenza Virus 4 NOT DETECTED NOT DETECTED Final   Respiratory Syncytial Virus NOT DETECTED NOT DETECTED Final   Bordetella pertussis NOT DETECTED NOT DETECTED Final   Bordetella Parapertussis NOT DETECTED NOT DETECTED Final   Chlamydophila pneumoniae NOT DETECTED NOT DETECTED Final   Mycoplasma pneumoniae NOT DETECTED NOT DETECTED Final    Comment: Performed at [facility], [address] [place], [place] [address]    Labs: CBC: Recent Labs  Lab 09/02/24 1341 09/03/24 1011 09/04/24 0255 09/05/24 0432 09/06/24 0210  WBC 10.3 10.0 9.4 10.1 8.5  NEUTROABS  --  8.0* 7.5 7.7 6.6  HGB 12.0 11.9* 10.9* 11.5* 10.9*  HCT 35.7* 36.0 32.9* 33.9* 33.1*  MCV 101.1* 100.8* 101.2* 100.6* 102.2*  PLT 178 197 206 255 301   Basic Metabolic Panel: Recent Labs  Lab 09/02/24 0327 09/03/24 0330 09/03/24 1011 09/04/24 0255 09/05/24 0432 09/06/24 0210  NA 136  --  133* 136 137 133*  K 4.0  --  3.6 3.7 3.6 3.7  CL 96*  --  95* 99 99 93*  CO2 28  --  28  27 29 31   GLUCOSE 111*  --  123* 108* 97 138*  BUN <5*  --  <5* <5* <5* 6*  CREATININE 0.52  --  0.55 0.54 0.55 0.31*  CALCIUM  9.0  --  8.4* 8.4* 8.7* 9.0  MG 1.6* 1.5*  --  1.8 1.6* 1.7  PHOS  --   --   --   --   --  3.3   Liver Function Tests: Recent Labs  Lab 08/31/24 0234 09/06/24 0210  AST 65* 70*  ALT 17 33  ALKPHOS 82 151*  BILITOT 0.5 0.4  PROT 6.9 7.0  ALBUMIN 2.0* 2.0*   CBG: No results for input(s): GLUCAP in the last 168 hours.  Discharge time spent: greater than 30 minutes.  Signed: Sabas GORMAN Brod, MD [facility] 09/06/2024

## 2024-09-06 NOTE — Plan of Care (Signed)

## 2024-09-06 NOTE — TOC Transition Note (Signed)
 Transition of Care [facility]) - Discharge Note   Patient Details  Name: Deanna Goodwin MRN: [id number] Date of Birth: 12/08/1960  [facility] CM/SW Contact:  Lauraine FORBES Saa, LCSWA Phone Number: 09/06/2024, 11:36 AM   Clinical Narrative:     Patient will DC to: [facility] Anticipated DC date: 09/06/24 Family notified: Mother  Transport by: [place]   Per MD patient ready for DC to [facility]. RN to call report prior to discharge [phone]). RN, patient, patient's family (patient called her mother), and facility notified of DC. Discharge Summary and FL2 sent to facility. DC packet on chart. Ambulance transport requested for patient at 11:35.  CSW will sign off for now as social work intervention is no longer needed. Please consult us  again if new needs arise.    Final next level of care: Skilled Nursing Facility Barriers to Discharge: Barriers Resolved   Patient Goals and CMS Choice Patient states their goals for this hospitalization and ongoing recovery are:: SNF          Discharge Placement              Patient [facility] bed at: [facility] Patient to be transferred to facility by: [facility] Name of family member notified: Mother Patient and family notified of of transfer: 09/06/24  Discharge Plan and Services Additional resources added to the After Visit Summary for   In-house Referral: Clinical Social Work Discharge Planning Services: CM Consult Post Acute Care Choice: Skilled Nursing Facility                               Social Drivers of Health (SDOH) Interventions SDOH Screenings   Food Insecurity: No Food Insecurity (08/28/2024)  Housing: High Risk (08/28/2024)  Transportation Needs: No Transportation Needs (08/28/2024)  Utilities: At Risk (08/28/2024)  Alcohol Screen: Low Risk (09/22/2023)  Depression (PHQ2-9): High Risk (09/22/2023)  Financial Resource Strain: Medium Risk (09/22/2023)  Physical Activity: Inactive (09/22/2023)   Social Connections: Moderately Integrated (09/22/2023)  Stress: No Stress Concern Present (09/22/2023)  Tobacco Use: High Risk (08/28/2024)  Health Literacy: Adequate Health Literacy (09/22/2023)     Readmission Risk Interventions     No data to display

## 2024-09-06 NOTE — Progress Notes (Signed)
 Patient performed a NIF -40 and VC 1.6L with good effort.    09/06/24 0825  Therapy Vitals  NIF -40 cmH2O  FVC 1.6 liters

## 2024-09-07 LAB — CULTURE, BLOOD (ROUTINE X 2)
Culture: NO GROWTH
Culture: NO GROWTH
Special Requests: ADEQUATE

## 2024-10-02 ENCOUNTER — Telehealth: Payer: Self-pay

## 2024-10-02 NOTE — Telephone Encounter (Signed)
 Received call from spouse requesting avs with admission/discharge dates as well as patient destination. Emailed to [email or web address].  Merilee Batty, MSN, RN Case Management [phone]

## 2025-01-14 ENCOUNTER — Ambulatory Visit: Admitting: Diagnostic Neuroimaging
